# Patient Record
Sex: Female | Born: 1990 | Race: White | Hispanic: No | Marital: Single | Smoking: Former smoker
Health system: Southern US, Community
[De-identification: ages and names within clinical notes are randomized; demographics above are authoritative.]

## PROBLEM LIST (undated history)

## (undated) ENCOUNTER — Inpatient Hospital Stay (HOSPITAL_COMMUNITY): Payer: Self-pay

## (undated) DIAGNOSIS — D649 Anemia, unspecified: Secondary | ICD-10-CM

## (undated) DIAGNOSIS — J984 Other disorders of lung: Secondary | ICD-10-CM

## (undated) DIAGNOSIS — S82891A Other fracture of right lower leg, initial encounter for closed fracture: Secondary | ICD-10-CM

## (undated) DIAGNOSIS — O2340 Unspecified infection of urinary tract in pregnancy, unspecified trimester: Secondary | ICD-10-CM

## (undated) HISTORY — DX: Other fracture of right lower leg, initial encounter for closed fracture: S82.891A

## (undated) HISTORY — DX: Other disorders of lung: J98.4

## (undated) HISTORY — DX: Anemia, unspecified: D64.9

---
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
## 2010-01-04 ENCOUNTER — Encounter: Payer: Self-pay | Admitting: Obstetrics and Gynecology

## 2010-01-04 ENCOUNTER — Ambulatory Visit: Payer: Self-pay | Admitting: Obstetrics & Gynecology

## 2010-01-04 LAB — CONVERTED CEMR LAB
HCT: 32.2 % — ABNORMAL LOW (ref 36.0–46.0)
Platelets: 210 10*3/uL (ref 150–400)
RDW: 13 % (ref 11.5–15.5)

## 2010-01-14 ENCOUNTER — Encounter: Payer: Self-pay | Admitting: Family Medicine

## 2010-01-14 LAB — CONVERTED CEMR LAB
Trich, Wet Prep: NONE SEEN
Yeast Wet Prep HPF POC: NONE SEEN

## 2010-01-25 ENCOUNTER — Ambulatory Visit: Payer: Self-pay | Admitting: Obstetrics & Gynecology

## 2010-02-02 ENCOUNTER — Inpatient Hospital Stay (HOSPITAL_COMMUNITY): Admission: AD | Admit: 2010-02-02 | Discharge: 2010-02-03 | Payer: Self-pay | Admitting: Obstetrics & Gynecology

## 2010-02-02 ENCOUNTER — Ambulatory Visit: Payer: Self-pay | Admitting: Family

## 2010-02-08 ENCOUNTER — Ambulatory Visit: Payer: Self-pay | Admitting: Obstetrics & Gynecology

## 2010-02-22 ENCOUNTER — Ambulatory Visit: Payer: Self-pay | Admitting: Family Medicine

## 2010-03-08 ENCOUNTER — Ambulatory Visit: Payer: Self-pay | Admitting: Obstetrics & Gynecology

## 2010-03-08 LAB — CONVERTED CEMR LAB: Chlamydia, Swab/Urine, PCR: NEGATIVE

## 2010-03-11 ENCOUNTER — Inpatient Hospital Stay (HOSPITAL_COMMUNITY): Admission: AD | Admit: 2010-03-11 | Discharge: 2010-03-11 | Payer: Self-pay | Admitting: Obstetrics & Gynecology

## 2010-03-11 ENCOUNTER — Ambulatory Visit: Payer: Self-pay | Admitting: Family Medicine

## 2010-03-15 ENCOUNTER — Ambulatory Visit: Payer: Self-pay | Admitting: Advanced Practice Midwife

## 2010-03-15 ENCOUNTER — Inpatient Hospital Stay (HOSPITAL_COMMUNITY): Admission: AD | Admit: 2010-03-15 | Discharge: 2010-03-15 | Payer: Self-pay | Admitting: Obstetrics & Gynecology

## 2010-03-15 ENCOUNTER — Ambulatory Visit: Payer: Self-pay | Admitting: Obstetrics & Gynecology

## 2010-03-17 ENCOUNTER — Encounter: Payer: Self-pay | Admitting: Obstetrics & Gynecology

## 2010-03-17 LAB — CONVERTED CEMR LAB
Collection Interval-CRCL: 24 hr
Creatinine Clearance: 119 mL/min — ABNORMAL HIGH (ref 75–115)
Creatinine, Urine: 48.9 mg/dL

## 2010-03-22 ENCOUNTER — Ambulatory Visit: Payer: Self-pay | Admitting: Obstetrics & Gynecology

## 2010-03-28 ENCOUNTER — Ambulatory Visit: Payer: Self-pay | Admitting: Obstetrics & Gynecology

## 2010-04-01 ENCOUNTER — Inpatient Hospital Stay (HOSPITAL_COMMUNITY): Admission: AD | Admit: 2010-04-01 | Discharge: 2010-04-03 | Payer: Self-pay | Admitting: Obstetrics & Gynecology

## 2010-04-01 ENCOUNTER — Ambulatory Visit: Payer: Self-pay | Admitting: Obstetrics & Gynecology

## 2010-05-10 ENCOUNTER — Ambulatory Visit: Payer: Self-pay | Admitting: Obstetrics & Gynecology

## 2010-05-17 ENCOUNTER — Ambulatory Visit: Payer: Self-pay | Admitting: Family Medicine

## 2010-06-28 ENCOUNTER — Ambulatory Visit: Payer: Self-pay | Admitting: Nurse Practitioner

## 2010-10-30 ENCOUNTER — Encounter: Payer: Self-pay | Admitting: Obstetrics & Gynecology

## 2010-12-25 LAB — CBC
MCH: 31.3 pg (ref 26.0–34.0)
MCHC: 34.3 g/dL (ref 30.0–36.0)
MCV: 91.3 fL (ref 78.0–100.0)
Platelets: 186 10*3/uL (ref 150–400)
RBC: 3.8 MIL/uL — ABNORMAL LOW (ref 3.87–5.11)
RDW: 14.4 % (ref 11.5–15.5)

## 2010-12-25 LAB — RPR: RPR Ser Ql: NONREACTIVE

## 2010-12-26 LAB — COMPREHENSIVE METABOLIC PANEL
ALT: 14 U/L (ref 0–35)
AST: 19 U/L (ref 0–37)
CO2: 24 mEq/L (ref 19–32)
Calcium: 9.4 mg/dL (ref 8.4–10.5)
GFR calc non Af Amer: >60 mL/min (ref >60)
Sodium: 135 mEq/L (ref 135–145)
Total Protein: 6.2 g/dL (ref 6.0–8.3)

## 2010-12-26 LAB — URINALYSIS, ROUTINE W REFLEX MICROSCOPIC
Hgb urine dipstick: NEGATIVE
Nitrite: NEGATIVE
Protein, ur: NEGATIVE mg/dL
Specific Gravity, Urine: 1.01 (ref 1.005–1.030)
Urobilinogen, UA: 0.2 mg/dL (ref 0.0–1.0)

## 2010-12-26 LAB — CBC
Platelets: 186 10*3/uL (ref 150–400)
RDW: 13.5 % (ref 11.5–15.5)
WBC: 9.5 10*3/uL (ref 4.0–10.5)

## 2010-12-26 LAB — LACTATE DEHYDROGENASE: LDH: 126 U/L (ref 94–250)

## 2010-12-26 LAB — URINE MICROSCOPIC-ADD ON

## 2010-12-27 LAB — URINALYSIS, ROUTINE W REFLEX MICROSCOPIC
Glucose, UA: 100 mg/dL — AB
Ketones, ur: NEGATIVE mg/dL
Protein, ur: NEGATIVE mg/dL
Urobilinogen, UA: 0.2 mg/dL (ref 0.0–1.0)

## 2010-12-27 LAB — URINE MICROSCOPIC-ADD ON

## 2010-12-27 LAB — FETAL FIBRONECTIN: Fetal Fibronectin: POSITIVE — AB

## 2010-12-27 LAB — STREP B DNA PROBE

## 2010-12-27 LAB — WET PREP, GENITAL

## 2010-12-27 LAB — GC/CHLAMYDIA PROBE AMP, GENITAL: GC Probe Amp, Genital: NEGATIVE

## 2011-02-21 NOTE — Assessment & Plan Note (Signed)
NAME:  Sharon Chandler, Sharon Chandler          ACCOUNT NO.:  [id number]   MEDICAL RECORD NO.:  [id number]          PATIENT TYPE:  POB   LOCATION:  [facility] at [facility]         FACILITY:  [facility]   PHYSICIAN:  Scheryl Darter, MD       DATE OF BIRTH:  August 31, 1991   DATE OF SERVICE:  05/10/2010                                  CLINIC NOTE   The patient comes today postpartum from vaginal delivery on April 01, 2010.  She delivered a female at 40 weeks 2 days 6 pounds 15 ounces.  She  had a first-degree laceration which was repaired.  She has not  menstruated.  She has had no sexual activity since delivery.  She would  like to schedule insertion of Mirena.  She completed the postnatal  depression scale and her score was 0.  Her affect is normal.  Blood  pressure is 125/63, weight 123 pounds, height 5 feet 5 inches.  Abdomen  soft, nontender, no mass.  External genitalia,  vagina and cervix  appeared normal with well-healed perineum and no suture material  visible.  Uterus normal size, nontender, no mass.   IMPRESSION:  The patient is doing well postpartum.  She will return to  have a Mirena placed.  We gave her information on the device.      Scheryl Darter, MD     JA/MEDQ  D:  05/10/2010  T:  05/11/2010  Job:  [id number]

## 2011-02-21 NOTE — Group Therapy Note (Signed)
Sharon Chandler, LINDY          ACCOUNT NO.:  [id number]   MEDICAL RECORD NO.:  [id number]          PATIENT TYPE:  POB   LOCATION:  [facility]                   FACILITY:  [facility]   PHYSICIAN:  Argentina Donovan, MD        DATE OF BIRTH:  05/24/1991   DATE OF SERVICE:  05/26/2008                                  CLINIC NOTE   The patient is a 20 year old nulligravida white female who was seen in  March of this year because her mother had a history of blood clots.  She  had oral contraceptive in the past without problem.  She had regular  periods at that point.  Other complaint at that time was itchy bruised,  easily fatigued, and has had __________ headaches.  She came in July  2009, did not have a period since March 23, 2008, and complaint of breast  pain, back pain, nausea, headaches, increased urination, and fatigue.  At that time, beta hCG was done which was negative.  She returns today  on May 26, 2008, with the same complaint.  She has not had any  bleeding since June 2009, which is very unusual for her, and she has  once again had breast pain, nausea, fatigue, and bloating.  She has no  sign of hirsutism.  Her periods were regular up until March 2009.  She  has had undergone no acute trauma, emotional or otherwise, and I think  that she deserves now a workup.  We are going to get an ultrasound to  evaluate the ovaries for polycystic ovarian syndrome, and I think this  is more likely to be hypovolemic amenorrhea.  I will check her FSH, TSH,  prolactin, free testosterone and get another beta hCG and have her  return next week.  If the pregnancy test is negative and all other  things are normal, we will cycle her on birth control pills for few  months, I think that may relieve her symptoms.   IMPRESSION:  Secondary amenorrhea of unknown etiology.           ______________________________  Argentina Donovan, MD     PR/MEDQ  D:  05/26/2008  T:  05/27/2008  Job:  [id number]

## 2011-02-21 NOTE — Assessment & Plan Note (Signed)
NAME:  Sharon Chandler, Sharon Chandler          ACCOUNT NO.:  [id number]   MEDICAL RECORD NO.:  [id number]          PATIENT TYPE:  POB   LOCATION:  [facility]         FACILITY:  [facility]   PHYSICIAN:  Tinnie Gens, MD        DATE OF BIRTH:  09-17-1991   DATE OF SERVICE:  05/17/2010                                  CLINIC NOTE   The patient comes to office today for a Mirena IUD insertion.  The  patient is postpartum.  She delivered on April 01, 2010, female at 40 weeks  and 2 days.  She has not resumed sexual activity since then.  She has  not had her period yet.   PROCEDURE:  The patient has signed the consent for Mirena IUD.  The  pelvic exam was performed with no abnormalities noted.  The speculum was  inserted.  The cervix was prepped with the Betadine.  The tenaculum was  applied.  Cervix sounded to 7 cm.  The Mirena IUD was inserted without  any difficulties.  The strings were cut.  The tenaculum removed and  hemostasis was achieved.   ASSESSMENT:  Contraception, Mirena IUD.   PLAN:  The patient had Mirena IUD inserted today without any  difficulties.  She was given 800 mg of Motrin prior to the procedure and  asked to take that again before she goes to bed tonight.  She is asked  to report any excessive bleeding or pain.  She is instructed on how to  check the IUD strings.  She is asked to use backup contraception for 2  weeks from today.  She will return to the clinic in 6 weeks for IUD  string check.      Remonia Richter, NP    ______________________________  Tinnie Gens, MD    LR/MEDQ  D:  05/17/2010  T:  05/18/2010  Job:  [id number]

## 2011-02-21 NOTE — Assessment & Plan Note (Signed)
NAMEMARKEIA, Sharon Chandler          ACCOUNT NO.:  [id number]   MEDICAL RECORD NO.:  [id number]          PATIENT TYPE:  POB   LOCATION:  [facility]         FACILITY:  [facility]   PHYSICIAN:  Tinnie Gens, MD        DATE OF BIRTH:  1991/09/11   DATE OF SERVICE:  12/17/2007                                  CLINIC NOTE   CHIEF COMPLAINT:  Irregular cycles.   HISTORY OF PRESENT ILLNESS:  The patient is a 20 year old nullipara who  has previously been on oral contraceptives, placed on those by her  pediatrician who did a full workup because her mother had a history of  blood clots.  The patient reports that they have all been negative and  she took pills without difficulty in the past.  The patient has since  went to the health department where she got a Pap smear and cultures  done.  However, she was placed on Depo-Provera and got one shot of this  October 2008.  She went three months without a cycle and then has been  bleeding since December of this of 2008.  She is very unhappy about  this.  She also been started on Doryx 150 mg and Differin cream or  Differin gel for acne.  The patient has been sexually active for some  time, but is not currently sexually active.   PAST MEDICAL HISTORY:  Is negative.   PAST SURGICAL HISTORY:  Also negative.   MEDICATIONS:  1. Doryx 150 mg one p.o. daily.  2. Differin 0.3% gel to apply twice daily as needed.   ALLERGIES:  None known.   OBSTETRICAL HISTORY:  She is G0.   GYN HISTORY:  Menarche at age 44; normally has regular cycles, but does  have a history of anemia from bleeding.   FAMILY HISTORY:  Diabetes, coronary artery disease, hypertension and  blood clots in her mother who was not on pills at the time.  She does  use occasional alcohol and one caffeinated beverage per day. 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   REVIEW OF SYSTEMS:  A 14 point review of systems is reviewed. It  is  positive for easy  bruising, fatigue and headaches.  Otherwise, is  negative.   PHYSICAL EXAMINATION:  On exam today her vitals are as noted in the  chart.  She is a well-developed, well-nourished female in no acute  distress.  ABDOMEN:  Soft, nontender, nondistended.   IMPRESSION:  1. Irregular cycles of bleeding related to Depo Provera.  2. Need for birth control.  3. Acne.  4. History of anemia.   PLAN:  1. Start her on Fem Con FE after a negative  EPT today.  She started      her cycle today as well so she can start the pill packs on Sunday      with no unprotected intercourse until then.  2. CBC for history of anemia and continuous bleeding for the last 3      months.  3. Follow up two months to see how this is going for her. The patient      is given  a prescription for Fem Con FE  4. When the patient returns should try to get records from      [facility] if possible regarding this workup.           ______________________________  Tinnie Gens, MD     TP/MEDQ  D:  12/17/2007  T:  12/18/2007  Job:  [id number]

## 2011-05-25 ENCOUNTER — Ambulatory Visit (INDEPENDENT_AMBULATORY_CARE_PROVIDER_SITE_OTHER): Payer: BC Managed Care – PPO | Admitting: Family Medicine

## 2011-05-25 DIAGNOSIS — N390 Urinary tract infection, site not specified: Secondary | ICD-10-CM

## 2011-05-25 MED ORDER — CEPHALEXIN 500 MG PO CAPS: 500.0000 mg | ORAL_CAPSULE | Freq: Three times a day (TID) | ORAL | Status: AC

## 2011-05-25 NOTE — Patient Instructions (Signed)
Patient was seen today for UTI symptoms. Per patient  X 1 week experiencing lower pelvic pain, painful urination. Urine dipstick was done today which shows large(+) blood and Large (+) Lueks. Urine was sent for culture. Keflex 500mg  was send to Pharmacy. Pt will call office if not better.

## 2011-05-28 LAB — URINE CULTURE: Colony Count: >=100000

## 2011-06-22 ENCOUNTER — Telehealth: Payer: Self-pay | Admitting: *Deleted

## 2011-06-22 DIAGNOSIS — N390 Urinary tract infection, site not specified: Secondary | ICD-10-CM

## 2011-06-22 MED ORDER — CIPROFLOXACIN HCL 500 MG PO TABS: 500.0000 mg | ORAL_TABLET | Freq: Two times a day (BID) | ORAL | Status: DC

## 2011-06-22 MED ORDER — CIPROFLOXACIN HCL 500 MG PO TABS: 500.0000 mg | ORAL_TABLET | Freq: Two times a day (BID) | ORAL | Status: AC

## 2011-06-22 NOTE — Telephone Encounter (Signed)
Patients UTI symptoms have returned.  She is having increased urgency with urination and pain with urination.  She did take the Keflex without any problems, she wishes to have something else called in.  She is not able to come into the office today.  We will call in Cipro for her.

## 2011-06-22 NOTE — Telephone Encounter (Signed)
Meds were called into the wrong [facility], she wishes to have it at [facility].

## 2011-06-29 ENCOUNTER — Ambulatory Visit: Payer: BC Managed Care – PPO | Admitting: Obstetrics & Gynecology

## 2011-06-29 DIAGNOSIS — Z01419 Encounter for gynecological examination (general) (routine) without abnormal findings: Secondary | ICD-10-CM

## 2011-11-24 ENCOUNTER — Telehealth: Payer: Self-pay | Admitting: *Deleted

## 2011-11-24 DIAGNOSIS — N390 Urinary tract infection, site not specified: Secondary | ICD-10-CM

## 2011-11-24 MED ORDER — CIPROFLOXACIN HCL 500 MG PO TABS: 500.0000 mg | ORAL_TABLET | Freq: Two times a day (BID) | ORAL | Status: AC

## 2011-11-24 NOTE — Telephone Encounter (Signed)
Patient has burning and pain with urination.  History of UTI  .  Unable to be seen until mid next week.  Will call in meds and she will call us back if her problem continues.

## 2012-03-25 ENCOUNTER — Other Ambulatory Visit (INDEPENDENT_AMBULATORY_CARE_PROVIDER_SITE_OTHER): Payer: BC Managed Care – PPO | Admitting: *Deleted

## 2012-03-25 DIAGNOSIS — R319 Hematuria, unspecified: Secondary | ICD-10-CM

## 2012-03-25 LAB — POCT URINALYSIS DIPSTICK
Bilirubin, UA: NEGATIVE
Glucose, UA: NEGATIVE
Ketones, UA: NEGATIVE
Leukocytes, UA: NEGATIVE
Protein, UA: NEGATIVE

## 2012-03-25 NOTE — Progress Notes (Signed)
Patient is having increased right side pain that is radiating to her back.  This is going on for two weeks now and the pain is constant but it gets worse and eases off.  Her urine is clean except for a small amount of blood seen.  I will send for culture to be sure and have advised her to make an appointment to see the physician as she might be having a kidney stone, rather than an infection.

## 2012-04-04 ENCOUNTER — Ambulatory Visit (INDEPENDENT_AMBULATORY_CARE_PROVIDER_SITE_OTHER): Payer: BC Managed Care – PPO | Admitting: Obstetrics & Gynecology

## 2012-04-04 ENCOUNTER — Encounter: Payer: Self-pay | Admitting: Obstetrics & Gynecology

## 2012-04-04 VITALS — BP 104/68 | HR 65 | Ht 64.0 in | Wt 117.0 lb

## 2012-04-04 DIAGNOSIS — B9689 Other specified bacterial agents as the cause of diseases classified elsewhere: Secondary | ICD-10-CM

## 2012-04-04 DIAGNOSIS — N760 Acute vaginitis: Secondary | ICD-10-CM

## 2012-04-04 DIAGNOSIS — R102 Pelvic and perineal pain: Secondary | ICD-10-CM

## 2012-04-04 DIAGNOSIS — N949 Unspecified condition associated with female genital organs and menstrual cycle: Secondary | ICD-10-CM

## 2012-04-04 DIAGNOSIS — A499 Bacterial infection, unspecified: Secondary | ICD-10-CM

## 2012-04-04 NOTE — Patient Instructions (Signed)
Return to clinic for any scheduled appointments or for any gynecologic concerns as needed.   

## 2012-04-04 NOTE — Progress Notes (Signed)
History:  20 y.o. G1P1001 here today for RLQ and R flank pain and urinary symptoms for over two weeks.  Denies any vaginal discharge, but reports unprotected intercourse recently.  Pain is constant, radiates from RLQ to R flank and back.  8/10 on pain scale.  No nausea, vomiting, fevers or other systemic symptoms.  The following portions of the patient's history were reviewed and updated as appropriate: allergies, current medications, past family history, past medical history, past social history, past surgical history and problem list.  Review of Systems:  Pertinent items are noted in HPI.  Objective:  Physical Exam Blood pressure 104/68, pulse 65, height 5\' 4"  (1.626 m), weight 117 lb (53.071 kg). Gen: NAD Abd/Back: Soft, nontender , moderate RLQ tenderness and flank tenderness, R CVAT Pelvic: Normal appearing external genitalia; normal appearing vaginal mucosa and cervix, IUD strings seen.  Thin, yellow vaginal discharge seen.  Small uterus, no other palpable masses, no uterine tenderness.  R adnexal fullness > left, tenderness on right adnexal region. Wet prep and GC/Chlam samples obtained   Assessment & Plan:  R pelvic pain/flank pain Pelvic ultrasound to evaluate for ovarian cysts, IUD position or other anomalies Renal ultrasound to evaluate for stones Wet prep, GC/Chlam to evaluate for infectious etiologies.  Safe sex practices emphasized. Follow up results and manage accordingly

## 2012-04-05 LAB — WET PREP, GENITAL
Trich, Wet Prep: NONE SEEN
Yeast Wet Prep HPF POC: NONE SEEN

## 2012-04-05 LAB — GC/CHLAMYDIA PROBE AMP, GENITAL
Chlamydia, DNA Probe: NEGATIVE
GC Probe Amp, Genital: NEGATIVE

## 2012-04-08 ENCOUNTER — Ambulatory Visit: Payer: Self-pay | Admitting: Obstetrics & Gynecology

## 2012-04-08 MED ORDER — TINIDAZOLE 500 MG PO TABS: 2.0000 g | ORAL_TABLET | Freq: Every day | ORAL | Status: AC

## 2012-04-08 NOTE — Progress Notes (Signed)
Wet prep showed clue cells, Tinidazole e-prescribed. Patient will be called to inform her of diagnosis and treatment, and to go pick up the prescription.  She will also be told to call the clinic with any further questions or further concerns.

## 2012-04-08 NOTE — Addendum Note (Signed)
Addended by: Jaynie Collins A on: 04/08/2012 10:01 AM   Modules accepted: Orders

## 2012-04-15 ENCOUNTER — Encounter: Payer: Self-pay | Admitting: Obstetrics & Gynecology

## 2012-07-24 ENCOUNTER — Encounter: Payer: Self-pay | Admitting: Obstetrics and Gynecology

## 2012-07-24 ENCOUNTER — Ambulatory Visit (INDEPENDENT_AMBULATORY_CARE_PROVIDER_SITE_OTHER): Payer: BC Managed Care – PPO | Admitting: Obstetrics and Gynecology

## 2012-07-24 VITALS — BP 100/68 | HR 63 | Ht 64.0 in | Wt 123.0 lb

## 2012-07-24 DIAGNOSIS — Z124 Encounter for screening for malignant neoplasm of cervix: Secondary | ICD-10-CM

## 2012-07-24 DIAGNOSIS — Z01419 Encounter for gynecological examination (general) (routine) without abnormal findings: Secondary | ICD-10-CM

## 2012-07-24 DIAGNOSIS — N390 Urinary tract infection, site not specified: Secondary | ICD-10-CM

## 2012-07-24 DIAGNOSIS — Z113 Encounter for screening for infections with a predominantly sexual mode of transmission: Secondary | ICD-10-CM

## 2012-07-24 LAB — POCT URINALYSIS DIPSTICK
Nitrite, UA: NEGATIVE
Urobilinogen, UA: 0.2
pH, UA: 5

## 2012-07-24 MED ORDER — NITROFURANTOIN MONOHYD MACRO 100 MG PO CAPS: 100.0000 mg | ORAL_CAPSULE | Freq: Two times a day (BID) | ORAL | Status: DC

## 2012-07-24 NOTE — Addendum Note (Signed)
Addended by: Gaylen Venning C on: 07/24/2012 11:00 AM   Modules accepted: Orders  

## 2012-07-24 NOTE — Patient Instructions (Signed)
Preventive Care for Adults, Female A healthy lifestyle and preventive care can promote health and wellness. Preventive health guidelines for women include the following key practices.  A routine yearly physical is a good way to check with your caregiver about your health and preventive screening. It is a chance to share any concerns and updates on your health, and to receive a thorough exam.  Visit your dentist for a routine exam and preventive care every 6 months. Brush your teeth twice a day and floss once a day. Good oral hygiene prevents tooth decay and gum disease.  The frequency of eye exams is based on your age, health, family medical history, use of contact lenses, and other factors. Follow your caregiver's recommendations for frequency of eye exams.  Eat a healthy diet. Foods like vegetables, fruits, whole grains, low-fat dairy products, and lean protein foods contain the nutrients you need without too many calories. Decrease your intake of foods high in solid fats, added sugars, and salt. Eat the right amount of calories for you.Get information about a proper diet from your caregiver, if necessary.  Regular physical exercise is one of the most important things you can do for your health. Most adults should get at least 150 minutes of moderate-intensity exercise (any activity that increases your heart rate and causes you to sweat) each week. In addition, most adults need muscle-strengthening exercises on 2 or more days a week.  Maintain a healthy weight. The body mass index (BMI) is a screening tool to identify possible weight problems. It provides an estimate of body fat based on height and weight. Your caregiver can help determine your BMI, and can help you achieve or maintain a healthy weight.For adults 20 years and older:  A BMI below 18.5 is considered underweight.  A BMI of 18.5 to 24.9 is normal.  A BMI of 25 to 29.9 is considered overweight.  A BMI of 30 and above is  considered obese.  Maintain normal blood lipids and cholesterol levels by exercising and minimizing your intake of saturated fat. Eat a balanced diet with plenty of fruit and vegetables. Blood tests for lipids and cholesterol should begin at age 20 and be repeated every 5 years. If your lipid or cholesterol levels are high, you are over 50, or you are at high risk for heart disease, you may need your cholesterol levels checked more frequently.Ongoing high lipid and cholesterol levels should be treated with medicines if diet and exercise are not effective.  If you smoke, find out from your caregiver how to quit. If you do not use tobacco, do not start.  If you are pregnant, do not drink alcohol. If you are breastfeeding, be very cautious about drinking alcohol. If you are not pregnant and choose to drink alcohol, do not exceed 1 drink per day. One drink is considered to be 12 ounces (355 mL) of beer, 5 ounces (148 mL) of wine, or 1.5 ounces (44 mL) of liquor.  Avoid use of street drugs. Do not share needles with anyone. Ask for help if you need support or instructions about stopping the use of drugs.  High blood pressure causes heart disease and increases the risk of stroke. Your blood pressure should be checked at least every 1 to 2 years. Ongoing high blood pressure should be treated with medicines if weight loss and exercise are not effective.  If you are 55 to 21 years old, ask your caregiver if you should take aspirin to prevent strokes.  Diabetes   screening involves taking a blood sample to check your fasting blood sugar level. This should be done once every 3 years, after age 45, if you are within normal weight and without risk factors for diabetes. Testing should be considered at a younger age or be carried out more frequently if you are overweight and have at least 1 risk factor for diabetes.  Breast cancer screening is essential preventive care for women. You should practice "breast  self-awareness." This means understanding the normal appearance and feel of your breasts and may include breast self-examination. Any changes detected, no matter how small, should be reported to a caregiver. Women in their 20s and 30s should have a clinical breast exam (CBE) by a caregiver as part of a regular health exam every 1 to 3 years. After age 40, women should have a CBE every year. Starting at age 40, women should consider having a mammography (breast X-ray test) every year. Women who have a family history of breast cancer should talk to their caregiver about genetic screening. Women at a high risk of breast cancer should talk to their caregivers about having magnetic resonance imaging (MRI) and a mammography every year.  The Pap test is a screening test for cervical cancer. A Pap test can show cell changes on the cervix that might become cervical cancer if left untreated. A Pap test is a procedure in which cells are obtained and examined from the lower end of the uterus (cervix).  Women should have a Pap test starting at age 21.  Between ages 21 and 29, Pap tests should be repeated every 2 years.  Beginning at age 30, you should have a Pap test every 3 years as long as the past 3 Pap tests have been normal.  Some women have medical problems that increase the chance of getting cervical cancer. Talk to your caregiver about these problems. It is especially important to talk to your caregiver if a new problem develops soon after your last Pap test. In these cases, your caregiver may recommend more frequent screening and Pap tests.  The above recommendations are the same for women who have or have not gotten the vaccine for human papillomavirus (HPV).  If you had a hysterectomy for a problem that was not cancer or a condition that could lead to cancer, then you no longer need Pap tests. Even if you no longer need a Pap test, a regular exam is a good idea to make sure no other problems are  starting.  If you are between ages 65 and 70, and you have had normal Pap tests going back 10 years, you no longer need Pap tests. Even if you no longer need a Pap test, a regular exam is a good idea to make sure no other problems are starting.  If you have had past treatment for cervical cancer or a condition that could lead to cancer, you need Pap tests and screening for cancer for at least 20 years after your treatment.  If Pap tests have been discontinued, risk factors (such as a new sexual partner) need to be reassessed to determine if screening should be resumed.  The HPV test is an additional test that may be used for cervical cancer screening. The HPV test looks for the virus that can cause the cell changes on the cervix. The cells collected during the Pap test can be tested for HPV. The HPV test could be used to screen women aged 30 years and older, and should   be used in women of any age who have unclear Pap test results. After the age of 30, women should have HPV testing at the same frequency as a Pap test.  Colorectal cancer can be detected and often prevented. Most routine colorectal cancer screening begins at the age of 50 and continues through age 75. However, your caregiver may recommend screening at an earlier age if you have risk factors for colon cancer. On a yearly basis, your caregiver may provide home test kits to check for hidden blood in the stool. Use of a small camera at the end of a tube, to directly examine the colon (sigmoidoscopy or colonoscopy), can detect the earliest forms of colorectal cancer. Talk to your caregiver about this at age 50, when routine screening begins. Direct examination of the colon should be repeated every 5 to 10 years through age 75, unless early forms of pre-cancerous polyps or small growths are found.  Hepatitis C blood testing is recommended for all people born from 1945 through 1965 and any individual with known risks for hepatitis C.  Practice  safe sex. Use condoms and avoid high-risk sexual practices to reduce the spread of sexually transmitted infections (STIs). STIs include gonorrhea, chlamydia, syphilis, trichomonas, herpes, HPV, and human immunodeficiency virus (HIV). Herpes, HIV, and HPV are viral illnesses that have no cure. They can result in disability, cancer, and death. Sexually active women aged 25 and younger should be checked for chlamydia. Older women with new or multiple partners should also be tested for chlamydia. Testing for other STIs is recommended if you are sexually active and at increased risk.  Osteoporosis is a disease in which the bones lose minerals and strength with aging. This can result in serious bone fractures. The risk of osteoporosis can be identified using a bone density scan. Women ages 65 and over and women at risk for fractures or osteoporosis should discuss screening with their caregivers. Ask your caregiver whether you should take a calcium supplement or vitamin D to reduce the rate of osteoporosis.  Menopause can be associated with physical symptoms and risks. Hormone replacement therapy is available to decrease symptoms and risks. You should talk to your caregiver about whether hormone replacement therapy is right for you.  Use sunscreen with sun protection factor (SPF) of 30 or more. Apply sunscreen liberally and repeatedly throughout the day. You should seek shade when your shadow is shorter than you. Protect yourself by wearing long sleeves, pants, a wide-brimmed hat, and sunglasses year round, whenever you are outdoors.  Once a month, do a whole body skin exam, using a mirror to look at the skin on your back. Notify your caregiver of new moles, moles that have irregular borders, moles that are larger than a pencil eraser, or moles that have changed in shape or color.  Stay current with required immunizations.  Influenza. You need a dose every fall (or winter). The composition of the flu vaccine  changes each year, so being vaccinated once is not enough.  Pneumococcal polysaccharide. You need 1 to 2 doses if you smoke cigarettes or if you have certain chronic medical conditions. You need 1 dose at age 65 (or older) if you have never been vaccinated.  Tetanus, diphtheria, pertussis (Tdap, Td). Get 1 dose of Tdap vaccine if you are younger than age 65, are over 65 and have contact with an infant, are a healthcare worker, are pregnant, or simply want to be protected from whooping cough. After that, you need a Td   booster dose every 10 years. Consult your caregiver if you have not had at least 3 tetanus and diphtheria-containing shots sometime in your life or have a deep or dirty wound.  HPV. You need this vaccine if you are a woman age 26 or younger. The vaccine is given in 3 doses over 6 months.  Measles, mumps, rubella (MMR). You need at least 1 dose of MMR if you were born in 1957 or later. You may also need a second dose.  Meningococcal. If you are age 19 to 21 and a first-year college student living in a residence hall, or have one of several medical conditions, you need to get vaccinated against meningococcal disease. You may also need additional booster doses.  Zoster (shingles). If you are age 60 or older, you should get this vaccine.  Varicella (chickenpox). If you have never had chickenpox or you were vaccinated but received only 1 dose, talk to your caregiver to find out if you need this vaccine.  Hepatitis A. You need this vaccine if you have a specific risk factor for hepatitis A virus infection or you simply wish to be protected from this disease. The vaccine is usually given as 2 doses, 6 to 18 months apart.  Hepatitis B. You need this vaccine if you have a specific risk factor for hepatitis B virus infection or you simply wish to be protected from this disease. The vaccine is given in 3 doses, usually over 6 months. Preventive Services / Frequency Ages 19 to 39  Blood  pressure check.** / Every 1 to 2 years.  Lipid and cholesterol check.** / Every 5 years beginning at age 20.  Clinical breast exam.** / Every 3 years for women in their 20s and 30s.  Pap test.** / Every 2 years from ages 21 through 29. Every 3 years starting at age 30 through age 65 or 70 with a history of 3 consecutive normal Pap tests.  HPV screening.** / Every 3 years from ages 30 through ages 65 to 70 with a history of 3 consecutive normal Pap tests.  Hepatitis C blood test.** / For any individual with known risks for hepatitis C.  Skin self-exam. / Monthly.  Influenza immunization.** / Every year.  Pneumococcal polysaccharide immunization.** / 1 to 2 doses if you smoke cigarettes or if you have certain chronic medical conditions.  Tetanus, diphtheria, pertussis (Tdap, Td) immunization. / A one-time dose of Tdap vaccine. After that, you need a Td booster dose every 10 years.  HPV immunization. / 3 doses over 6 months, if you are 26 and younger.  Measles, mumps, rubella (MMR) immunization. / You need at least 1 dose of MMR if you were born in 1957 or later. You may also need a second dose.  Meningococcal immunization. / 1 dose if you are age 19 to 21 and a first-year college student living in a residence hall, or have one of several medical conditions, you need to get vaccinated against meningococcal disease. You may also need additional booster doses.  Varicella immunization.** / Consult your caregiver.  Hepatitis A immunization.** / Consult your caregiver. 2 doses, 6 to 18 months apart.  Hepatitis B immunization.** / Consult your caregiver. 3 doses usually over 6 months. ** Family history and personal history of risk and conditions may change your caregiver's recommendations. Document Released: 11/21/2001 Document Revised: 12/18/2011 Document Reviewed: 02/20/2011 [employer] Patient Information 2013 [employer].  

## 2012-07-24 NOTE — Addendum Note (Signed)
Addended by: Vinnie Langton C on: 07/24/2012 11:00 AM   Modules accepted: Orders

## 2012-07-24 NOTE — Progress Notes (Signed)
  Subjective:     Sharon Chandler is a 21 y.o. female G1P1 with BMI 21 who is here for a comprehensive physical exam. The patient reports a vaginal discharge x 2 weeks. She describes it as white and non-pruritic, not associated with an odor. She was treated 1 month ago for a UTI and is uncertain if it has returned or if this is a separate issue. She is using Mirena IUD for birth control and is satisfied with her choice.  History   Social History  . Marital Status: Single    Spouse Name: N/A    Number of Children: N/A  . Years of Education: N/A   Occupational History  . Not on file.   Social History Main Topics  . Smoking status: Never Smoker   . Smokeless tobacco: Not on file  . Alcohol Use: Yes     ocacasion  . Drug Use: No  . Sexually Active: Yes -- Female partner(s)    Birth Control/ Protection: IUD   Other Topics Concern  . Not on file   Social History Narrative  . No narrative on file   Health Maintenance  Topic Date Due  . Chlamydia Screening  11/12/2005  . Pap Smear  11/12/2008  . Tetanus/tdap  11/12/2009  . Influenza Vaccine  06/09/2012       Review of Systems A comprehensive review of systems was negative.   Objective:     GENERAL: Well-developed, well-nourished female in no acute distress.  HEENT: Normocephalic, atraumatic. Sclerae anicteric.  NECK: Supple. Normal thyroid.  LUNGS: Clear to auscultation bilaterally.  HEART: Regular rate and rhythm. BREASTS: Symmetric in size. No palpable masses or lymphadenopathy, skin changes, or nipple drainage. ABDOMEN: Soft, nontender, nondistended. No organomegaly. PELVIC: Normal external female genitalia. Vagina is pink and rugated.  Normal discharge. Normal appearing cervix. IUD strings visualized at os. Uterus is normal in size. No adnexal mass or tenderness. EXTREMITIES: No cyanosis, clubbing, or edema, 2+ distal pulses.    Assessment:    Healthy female exam.      Plan:    Pap smear and cultures collected Wet  prep collected UA showed large blood and moderate leuk- will send urine culture and treat with macrobid See After Visit Summary for Counseling Recommendations

## 2012-07-26 LAB — URINE CULTURE: Colony Count: NO GROWTH

## 2012-07-29 ENCOUNTER — Ambulatory Visit: Payer: BC Managed Care – PPO | Admitting: Internal Medicine

## 2012-07-31 ENCOUNTER — Telehealth: Payer: Self-pay | Admitting: *Deleted

## 2012-07-31 DIAGNOSIS — B9689 Other specified bacterial agents as the cause of diseases classified elsewhere: Secondary | ICD-10-CM

## 2012-07-31 MED ORDER — METRONIDAZOLE 500 MG PO TABS: 500.0000 mg | ORAL_TABLET | Freq: Two times a day (BID) | ORAL | Status: DC

## 2012-07-31 NOTE — Telephone Encounter (Signed)
Patient is still having increased odor.  Her wet prep shows clue cells.  I will call in flagyl for her and she will call us back to follow up if symptoms persist.

## 2012-12-19 ENCOUNTER — Ambulatory Visit: Payer: Self-pay | Admitting: Psychiatry

## 2012-12-27 LAB — RAPID URINE DRUG SCREEN, HOSP PERFORMED
Amphetamines, Ur Screen: NEGATIVE (ref ?–1000)
Barbiturates, Ur Screen: NEGATIVE (ref ?–200)
Benzodiazepine, Ur Scrn: NEGATIVE (ref ?–200)
Cannabinoid 50 Ng, Ur ~~LOC~~: POSITIVE (ref ?–50)
Cocaine Metabolite,Ur ~~LOC~~: POSITIVE (ref ?–300)
Opiate, Ur Screen: NEGATIVE (ref ?–300)

## 2013-01-07 ENCOUNTER — Ambulatory Visit: Payer: Self-pay | Admitting: Psychiatry

## 2013-01-09 ENCOUNTER — Other Ambulatory Visit (INDEPENDENT_AMBULATORY_CARE_PROVIDER_SITE_OTHER): Payer: BC Managed Care – PPO | Admitting: *Deleted

## 2013-01-09 DIAGNOSIS — R319 Hematuria, unspecified: Secondary | ICD-10-CM

## 2013-01-09 LAB — POCT URINALYSIS DIPSTICK
Spec Grav, UA: 1.02
Urobilinogen, UA: NEGATIVE

## 2013-01-09 NOTE — Progress Notes (Signed)
Starting last week patient has been having increased nausea and stomach pains.  She has also had a headache.  Her urine is only showing blood and otherwise wnl.

## 2013-01-12 LAB — CULTURE, URINE COMPREHENSIVE

## 2013-02-17 ENCOUNTER — Other Ambulatory Visit (INDEPENDENT_AMBULATORY_CARE_PROVIDER_SITE_OTHER): Payer: Managed Care, Other (non HMO) | Admitting: *Deleted

## 2013-02-17 DIAGNOSIS — N390 Urinary tract infection, site not specified: Secondary | ICD-10-CM

## 2013-02-17 LAB — POCT URINALYSIS DIPSTICK
Ketones, UA: NEGATIVE
Spec Grav, UA: 1.01
Urobilinogen, UA: NEGATIVE
pH, UA: 6

## 2013-02-17 MED ORDER — NITROFURANTOIN MONOHYD MACRO 100 MG PO CAPS: 100.0000 mg | ORAL_CAPSULE | Freq: Two times a day (BID) | ORAL | Status: DC
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
## 2013-02-17 NOTE — Addendum Note (Signed)
Addended by: Barbara Cower on: 02/17/2013 02:35 PM   Modules accepted: Orders

## 2013-02-19 LAB — CULTURE, URINE COMPREHENSIVE: Colony Count: >=100000

## 2013-04-30 ENCOUNTER — Telehealth: Payer: Self-pay | Admitting: *Deleted

## 2013-04-30 DIAGNOSIS — N390 Urinary tract infection, site not specified: Secondary | ICD-10-CM

## 2013-04-30 MED ORDER — CEPHALEXIN 500 MG PO CAPS: 500.0000 mg | ORAL_CAPSULE | Freq: Four times a day (QID) | ORAL | Status: DC

## 2013-04-30 NOTE — Telephone Encounter (Signed)
Patient is having burning and pain with urination.  She has a history of uti and would like something called in for this.  She will call for an appointment if her symptoms persist.

## 2013-05-07 ENCOUNTER — Telehealth: Payer: Self-pay | Admitting: *Deleted

## 2013-05-07 DIAGNOSIS — B379 Candidiasis, unspecified: Secondary | ICD-10-CM

## 2013-05-07 MED ORDER — FLUCONAZOLE 150 MG PO TABS: 150.0000 mg | ORAL_TABLET | Freq: Once | ORAL | Status: DC

## 2013-05-07 NOTE — Telephone Encounter (Signed)
Patient is having yeast infection after taking antibiotics for a uti.  She would like diflucan called in.

## 2013-05-21 NOTE — Progress Notes (Signed)
Saw an RN only for UTI--dipstick c/w UTI-abx called in.

## 2013-06-26 ENCOUNTER — Other Ambulatory Visit (INDEPENDENT_AMBULATORY_CARE_PROVIDER_SITE_OTHER): Payer: Managed Care, Other (non HMO) | Admitting: *Deleted

## 2013-06-26 DIAGNOSIS — N390 Urinary tract infection, site not specified: Secondary | ICD-10-CM

## 2013-06-26 LAB — POCT URINALYSIS DIPSTICK
Ketones, UA: NEGATIVE
pH, UA: 6

## 2013-06-26 MED ORDER — PHENAZOPYRIDINE HCL 200 MG PO TABS: 200.0000 mg | ORAL_TABLET | Freq: Three times a day (TID) | ORAL | Status: DC | PRN

## 2013-06-26 MED ORDER — CIPROFLOXACIN HCL 500 MG PO TABS: 500.0000 mg | ORAL_TABLET | Freq: Two times a day (BID) | ORAL | Status: DC

## 2013-06-26 NOTE — Progress Notes (Signed)
Patient is having painful and frequent urination.  She feels as if it happens every time she drinks too much mountain dew soda which she did over the past week.

## 2013-06-28 LAB — URINE CULTURE: Colony Count: 75000

## 2013-09-22 ENCOUNTER — Encounter: Payer: Self-pay | Admitting: Family Medicine

## 2013-09-22 ENCOUNTER — Ambulatory Visit (INDEPENDENT_AMBULATORY_CARE_PROVIDER_SITE_OTHER): Payer: Managed Care, Other (non HMO) | Admitting: Family Medicine

## 2013-09-22 VITALS — BP 88/62 | HR 77 | Ht 64.5 in | Wt 124.8 lb

## 2013-09-22 DIAGNOSIS — Z113 Encounter for screening for infections with a predominantly sexual mode of transmission: Secondary | ICD-10-CM

## 2013-09-22 DIAGNOSIS — R300 Dysuria: Secondary | ICD-10-CM | POA: Insufficient documentation

## 2013-09-22 DIAGNOSIS — Z23 Encounter for immunization: Secondary | ICD-10-CM

## 2013-09-22 NOTE — Progress Notes (Signed)
   Subjective:    Patient ID: Sharon Chandler, female    DOB: October 06, 1991, 22 y.o.   MRN: [id number]  Urinary Tract Infection  Associated symptoms include urgency. Pertinent negatives include no chills.   Comes in with what she thinks are frequent UTI's, however, urine cultures have not really grown a urinary pathogen.  Does have white vaginal discharge and some pain associated.  She reports minimal dysuria, but some odor and hesitancy. Partner with diagnosis of hep C.  Would like testing.  No ulcers.   Review of Systems  Constitutional: Negative for fever and chills.  Respiratory: Negative for shortness of breath.   Cardiovascular: Negative for chest pain.  Gastrointestinal: Negative for abdominal pain.  Genitourinary: Positive for urgency, vaginal discharge and vaginal pain. Negative for genital sores and menstrual problem.       Objective:   Physical Exam  Vitals reviewed. Constitutional: She is oriented to person, place, and time. She appears well-developed and well-nourished.  Eyes: No scleral icterus.  Neck: Neck supple.  Cardiovascular: Normal rate.   Pulmonary/Chest: Effort normal.  Abdominal: Soft.  Genitourinary: Vagina normal.  White/clear discharge noted.  No true CMT.  Some bilateral adnexal tenderness.  IUD in place with IUD strings visualized.  Some adherent mucous to strings noted and left in situ.  Musculoskeletal: Normal range of motion.  Neurological: She is alert and oriented to person, place, and time.  Skin: Skin is warm and dry. No rash noted.          Assessment & Plan:  Flu shot today

## 2013-09-22 NOTE — Patient Instructions (Signed)

## 2013-09-22 NOTE — Assessment & Plan Note (Signed)
Unclear etiology.  Full w/u for all STD's with urine culture today.  Treat appropriately.

## 2013-09-22 NOTE — Progress Notes (Signed)
Patient is here today due to frequent urinary tract infections and she would like to be tested for sexually transmitted disease.  She recently found out that the father of her baby has Hepatitis, she is unsure of which type but it was from sharing a dirty needle with an infected person.  He has passed it to his father whom he lives with from sharing a razor to shave there face with.  He was told he has had it for a while but just found out recently.  The last time she was with him was a few months ago.

## 2013-09-23 ENCOUNTER — Telehealth: Payer: Self-pay | Admitting: *Deleted

## 2013-09-23 LAB — RPR

## 2013-09-23 LAB — HIV ANTIBODY (ROUTINE TESTING W REFLEX): HIV: NONREACTIVE

## 2013-09-23 LAB — WET PREP, GENITAL: Trich, Wet Prep: NONE SEEN

## 2013-09-23 LAB — HEPATITIS C ANTIBODY: HCV Ab: NEGATIVE

## 2013-09-23 LAB — GC/CHLAMYDIA PROBE AMP: GC Probe RNA: NEGATIVE

## 2013-09-23 NOTE — Telephone Encounter (Signed)
Message copied by Grayland Ormond on Tue Sep 23, 2013  4:35 PM ------      Message from: Reva Bores      Created: Tue Sep 23, 2013  8:28 AM       Pt. Has BV--please call in flagyl 500 mg bid x 7 d #14, no RF--other labs look good, please inform pt.--awaiting urine culture. ------

## 2013-09-23 NOTE — Telephone Encounter (Signed)
I have called in medication for pt to [facility] in [place] on [address].  Pt aware.

## 2013-10-02 ENCOUNTER — Emergency Department: Payer: Self-pay | Admitting: Emergency Medicine

## 2013-10-02 LAB — RAPID INFLUENZA A&B ANTIGENS

## 2013-10-05 LAB — BETA STREP CULTURE(ARMC)

## 2013-10-08 ENCOUNTER — Telehealth: Payer: Self-pay

## 2013-10-08 NOTE — Telephone Encounter (Signed)
Called in Dyflucan 150 mg for patient. Took antibiotic and developed a yeast infection. Called in #2 with no refills to [facility].

## 2013-10-09 DIAGNOSIS — R825 Elevated urine levels of drugs, medicaments and biological substances: Secondary | ICD-10-CM

## 2013-10-09 HISTORY — DX: Elevated urine levels of drugs, medicaments and biological substances: R82.5

## 2013-10-09 HISTORY — PX: ORIF ANKLE FRACTURE: SUR919

## 2013-11-11 DIAGNOSIS — J984 Other disorders of lung: Secondary | ICD-10-CM

## 2013-11-11 DIAGNOSIS — S82891A Other fracture of right lower leg, initial encounter for closed fracture: Secondary | ICD-10-CM

## 2013-11-11 DIAGNOSIS — S27322A Contusion of lung, bilateral, initial encounter: Secondary | ICD-10-CM | POA: Insufficient documentation

## 2013-11-11 DIAGNOSIS — R4020 Unspecified coma: Secondary | ICD-10-CM | POA: Insufficient documentation

## 2013-11-11 HISTORY — DX: Other fracture of right lower leg, initial encounter for closed fracture: S82.891A

## 2013-11-11 HISTORY — DX: Other disorders of lung: J98.4

## 2014-05-06 ENCOUNTER — Ambulatory Visit (INDEPENDENT_AMBULATORY_CARE_PROVIDER_SITE_OTHER): Payer: Medicaid Other | Admitting: Family Medicine

## 2014-05-06 ENCOUNTER — Encounter: Payer: Self-pay | Admitting: Family Medicine

## 2014-05-06 VITALS — BP 96/65 | HR 65 | Ht 64.0 in | Wt 123.0 lb

## 2014-05-06 DIAGNOSIS — B9689 Other specified bacterial agents as the cause of diseases classified elsewhere: Secondary | ICD-10-CM

## 2014-05-06 DIAGNOSIS — N760 Acute vaginitis: Secondary | ICD-10-CM

## 2014-05-06 DIAGNOSIS — Z113 Encounter for screening for infections with a predominantly sexual mode of transmission: Secondary | ICD-10-CM

## 2014-05-06 DIAGNOSIS — N898 Other specified noninflammatory disorders of vagina: Secondary | ICD-10-CM

## 2014-05-06 DIAGNOSIS — A499 Bacterial infection, unspecified: Secondary | ICD-10-CM

## 2014-05-06 MED ORDER — METRONIDAZOLE 500 MG PO TABS: 500.0000 mg | ORAL_TABLET | Freq: Two times a day (BID) | ORAL | Status: DC

## 2014-05-06 NOTE — Progress Notes (Signed)
    Subjective:    Patient ID: Sharon Chandler is a 23 y.o. female presenting with Vaginitis  on 05/06/2014  HPI: Comes in with 1 wk h/o vaginal odor, discharge which is white.  H/o BV in the past.  Denies F/C/N/V.  No vaginal irritation. New partner, would like full STD testing. Using Mirena without complaints.  Review of Systems  Constitutional: Negative for fever and chills.  Gastrointestinal: Negative for nausea, vomiting and abdominal pain.  Genitourinary: Negative for menstrual problem.  Psychiatric/Behavioral: Negative for dysphoric mood.      Objective:    BP 96/65  Pulse 65  Ht 5\' 4"  (1.626 m)  Wt 123 lb (55.792 kg)  BMI 21.10 kg/m2 Physical Exam  Constitutional: She appears well-developed and well-nourished. No distress.  HENT:  Head: Normocephalic and atraumatic.  Neck: Neck supple.  Cardiovascular: Normal rate.   Pulmonary/Chest: Effort normal.  Abdominal: Soft. There is no tenderness.  Genitourinary: Vaginal discharge (white, adherent) found.        Assessment & Plan:  Vaginal discharge - Plan: metroNIDAZOLE (FLAGYL) 500 MG tablet, GC/Chlamydia Probe Amp, Wet prep, genital  BV (bacterial vaginosis)  Screen for STD (sexually transmitted disease) - Plan: HIV antibody (with reflex), RPR, Hepatitis B surface antigen, Hepatitis C antibody    Return if symptoms worsen or fail to improve.

## 2014-05-06 NOTE — Patient Instructions (Signed)

## 2014-05-07 LAB — WET PREP, GENITAL
CLUE CELLS WET PREP: NONE SEEN
Trich, Wet Prep: NONE SEEN
WBC, Wet Prep HPF POC: NONE SEEN
Yeast Wet Prep HPF POC: NONE SEEN

## 2014-05-07 LAB — RPR

## 2014-05-07 LAB — HIV ANTIBODY (ROUTINE TESTING W REFLEX): HIV: NONREACTIVE

## 2014-05-07 LAB — HEPATITIS C ANTIBODY: HCV Ab: NEGATIVE

## 2014-05-07 LAB — HEPATITIS B SURFACE ANTIGEN: Hepatitis B Surface Ag: NEGATIVE

## 2014-05-07 LAB — GC/CHLAMYDIA PROBE AMP
CT PROBE, AMP APTIMA: NEGATIVE
GC Probe RNA: NEGATIVE

## 2014-05-08 ENCOUNTER — Telehealth: Payer: Self-pay | Admitting: *Deleted

## 2014-05-08 NOTE — Telephone Encounter (Signed)
Pt aware all normal .

## 2014-05-08 NOTE — Telephone Encounter (Signed)
Message copied by Grayland OrmondHINTON, Zakariah Urwin C on Fri May 08, 2014 11:17 AM ------      Message from: Reva BoresPRATT, TANYA S      Created: Thu May 07, 2014 10:55 AM       normal labs please inform pt. ------

## 2014-05-19 ENCOUNTER — Telehealth: Payer: Self-pay | Admitting: *Deleted

## 2014-05-19 DIAGNOSIS — B379 Candidiasis, unspecified: Secondary | ICD-10-CM

## 2014-05-19 MED ORDER — FLUCONAZOLE 150 MG PO TABS: 150.0000 mg | ORAL_TABLET | Freq: Once | ORAL | Status: DC

## 2014-05-19 NOTE — Telephone Encounter (Signed)
Patient called and is having symptoms of a yeast infection.  I have sent in Diflucan to patients pharmacy.  

## 2014-07-11 ENCOUNTER — Emergency Department: Payer: Self-pay | Admitting: Emergency Medicine

## 2014-07-11 LAB — URINALYSIS, COMPLETE
BILIRUBIN, UR: NEGATIVE
Blood: NEGATIVE
Glucose,UR: NEGATIVE mg/dL (ref 0–75)
Ketone: NEGATIVE
Nitrite: NEGATIVE
Ph: 7 (ref 4.5–8.0)
Protein: NEGATIVE
RBC,UR: 30 /HPF (ref 0–5)
Specific Gravity: 1.012 (ref 1.003–1.030)
Squamous Epithelial: 15
WBC UR: 14 /HPF (ref 0–5)

## 2014-08-10 ENCOUNTER — Encounter: Payer: Self-pay | Admitting: Family Medicine

## 2014-10-03 ENCOUNTER — Emergency Department: Payer: Self-pay | Admitting: Emergency Medicine

## 2014-10-03 LAB — URINALYSIS, COMPLETE
BACTERIA: NONE SEEN
BILIRUBIN, UR: NEGATIVE
BLOOD: NEGATIVE
Glucose,UR: NEGATIVE mg/dL (ref 0–75)
Ketone: NEGATIVE
NITRITE: NEGATIVE
PH: 7 (ref 4.5–8.0)
PROTEIN: NEGATIVE
Specific Gravity: 1.017 (ref 1.003–1.030)
WBC UR: 3 /HPF (ref 0–5)

## 2014-10-03 LAB — PREGNANCY, URINE: Pregnancy Test, Urine: NEGATIVE m[IU]/mL

## 2014-10-05 ENCOUNTER — Emergency Department: Payer: Self-pay | Admitting: Emergency Medicine

## 2014-10-05 LAB — URINALYSIS, COMPLETE
Bilirubin,UR: NEGATIVE
Blood: NEGATIVE
GLUCOSE, UR: NEGATIVE mg/dL (ref 0–75)
Ketone: NEGATIVE
Nitrite: NEGATIVE
PROTEIN: NEGATIVE
Ph: 7 (ref 4.5–8.0)
RBC,UR: 1 /HPF (ref 0–5)
Specific Gravity: 1.003 (ref 1.003–1.030)
Squamous Epithelial: 4

## 2015-02-28 ENCOUNTER — Encounter: Payer: Self-pay | Admitting: Emergency Medicine

## 2015-02-28 DIAGNOSIS — N719 Inflammatory disease of uterus, unspecified: Secondary | ICD-10-CM | POA: Insufficient documentation

## 2015-02-28 DIAGNOSIS — Z79899 Other long term (current) drug therapy: Secondary | ICD-10-CM | POA: Insufficient documentation

## 2015-02-28 DIAGNOSIS — Z720 Tobacco use: Secondary | ICD-10-CM | POA: Insufficient documentation

## 2015-02-28 DIAGNOSIS — Z793 Long term (current) use of hormonal contraceptives: Secondary | ICD-10-CM | POA: Insufficient documentation

## 2015-02-28 DIAGNOSIS — Z880 Allergy status to penicillin: Secondary | ICD-10-CM | POA: Insufficient documentation

## 2015-02-28 LAB — URINALYSIS COMPLETE WITH MICROSCOPIC (ARMC ONLY)
Bilirubin Urine: NEGATIVE
Glucose, UA: NEGATIVE mg/dL
NITRITE: NEGATIVE
Protein, ur: 30 mg/dL — AB
Specific Gravity, Urine: 1.02 (ref 1.005–1.030)
Trans Epithel, UA: <1
pH: 5 (ref 5.0–8.0)

## 2015-02-28 LAB — CBC WITH DIFFERENTIAL/PLATELET
BASOS ABS: 0.1 10*3/uL (ref 0–0.1)
BASOS PCT: 1 %
Eosinophils Absolute: 0.3 10*3/uL (ref 0–0.7)
Eosinophils Relative: 4 %
HCT: 41.5 % (ref 35.0–47.0)
Hemoglobin: 14.1 g/dL (ref 12.0–16.0)
Lymphocytes Relative: 37 %
Lymphs Abs: 2.6 10*3/uL (ref 1.0–3.6)
MCH: 31.6 pg (ref 26.0–34.0)
MCHC: 34 g/dL (ref 32.0–36.0)
MCV: 92.8 fL (ref 80.0–100.0)
MONO ABS: 0.3 10*3/uL (ref 0.2–0.9)
MONOS PCT: 4 %
Neutro Abs: 3.9 10*3/uL (ref 1.4–6.5)
Neutrophils Relative %: 54 %
PLATELETS: 223 10*3/uL (ref 150–440)
RBC: 4.47 MIL/uL (ref 3.80–5.20)
RDW: 13.5 % (ref 11.5–14.5)
WBC: 7.2 10*3/uL (ref 3.6–11.0)

## 2015-02-28 LAB — COMPREHENSIVE METABOLIC PANEL
ALBUMIN: 5 g/dL (ref 3.5–5.0)
ALT: 10 U/L — AB (ref 14–54)
ANION GAP: 5 (ref 5–15)
AST: 14 U/L — ABNORMAL LOW (ref 15–41)
Alkaline Phosphatase: 48 U/L (ref 38–126)
BUN: 14 mg/dL (ref 6–20)
CALCIUM: 9.1 mg/dL (ref 8.9–10.3)
CHLORIDE: 106 mmol/L (ref 101–111)
CO2: 26 mmol/L (ref 22–32)
CREATININE: 0.7 mg/dL (ref 0.44–1.00)
GFR calc non Af Amer: >60 mL/min (ref >60)
GLUCOSE: 89 mg/dL (ref 65–99)
Potassium: 3.6 mmol/L (ref 3.5–5.1)
Sodium: 137 mmol/L (ref 135–145)
Total Bilirubin: 0.7 mg/dL (ref 0.3–1.2)
Total Protein: 7.7 g/dL (ref 6.5–8.1)

## 2015-02-28 MED ORDER — OXYCODONE-ACETAMINOPHEN 5-325 MG PO TABS
1.0000 | ORAL_TABLET | Freq: Once | ORAL | Status: AC
  Administered 2015-02-28: 1 via ORAL

## 2015-02-28 MED ORDER — OXYCODONE-ACETAMINOPHEN 5-325 MG PO TABS
ORAL_TABLET | ORAL | Status: AC
  Filled 2015-02-28: qty 1

## 2015-02-28 NOTE — ED Notes (Signed)
Patient states that she started having lower abd pain 2-3 days ago. Patient denies nausea, vomiting or urinary symptoms. Patient states that she feels like her IUD has moved out of place.

## 2015-03-01 ENCOUNTER — Emergency Department: Payer: Medicaid Other

## 2015-03-01 ENCOUNTER — Emergency Department
Admission: EM | Admit: 2015-03-01 | Discharge: 2015-03-01 | Disposition: A | Discharge status: Home / Self Care | Payer: Self-pay | Attending: Emergency Medicine | Admitting: Emergency Medicine

## 2015-03-01 DIAGNOSIS — N719 Inflammatory disease of uterus, unspecified: Secondary | ICD-10-CM

## 2015-03-01 DIAGNOSIS — R109 Unspecified abdominal pain: Secondary | ICD-10-CM

## 2015-03-01 LAB — WET PREP, GENITAL
Clue Cells Wet Prep HPF POC: NONE SEEN
Trich, Wet Prep: NONE SEEN
Yeast Wet Prep HPF POC: NONE SEEN

## 2015-03-01 LAB — CHLAMYDIA/NGC RT PCR (ARMC ONLY)
CHLAMYDIA TR: NOT DETECTED
N gonorrhoeae: NOT DETECTED

## 2015-03-01 MED ORDER — AZITHROMYCIN 1 G PO PACK
PACK | ORAL | Status: AC
  Filled 2015-03-01: qty 1

## 2015-03-01 MED ORDER — AZITHROMYCIN 1 G PO PACK
1.0000 g | PACK | Freq: Once | ORAL | Status: AC
  Administered 2015-03-01: 1 g via ORAL

## 2015-03-01 MED ORDER — MORPHINE SULFATE 4 MG/ML IJ SOLN
4.0000 mg | Freq: Once | INTRAMUSCULAR | Status: AC
  Administered 2015-03-01: 4 mg via INTRAMUSCULAR

## 2015-03-01 MED ORDER — DOXYCYCLINE HYCLATE 100 MG PO TABS
100.0000 mg | ORAL_TABLET | Freq: Once | ORAL | Status: AC
  Administered 2015-03-01: 100 mg via ORAL

## 2015-03-01 MED ORDER — DOXYCYCLINE HYCLATE 100 MG PO TABS: 100.0000 mg | ORAL_TABLET | Freq: Two times a day (BID) | ORAL | Status: DC

## 2015-03-01 MED ORDER — DOXYCYCLINE HYCLATE 100 MG PO TABS
ORAL_TABLET | ORAL | Status: AC
  Filled 2015-03-01: qty 1

## 2015-03-01 MED ORDER — MORPHINE SULFATE 4 MG/ML IJ SOLN
INTRAMUSCULAR | Status: AC
  Filled 2015-03-01: qty 1

## 2015-03-01 MED ORDER — TRAMADOL HCL 50 MG PO TABS: 50.0000 mg | ORAL_TABLET | Freq: Four times a day (QID) | ORAL | Status: DC | PRN

## 2015-03-01 NOTE — Discharge Instructions (Signed)
Abdominal Pain Many things can cause abdominal pain. Usually, abdominal pain is not caused by a disease and will improve without treatment. It can often be observed and treated at home. Your health care provider will do a physical exam and possibly order blood tests and X-rays to help determine the seriousness of your pain. However, in many cases, more time must pass before a clear cause of the pain can be found. Before that point, your health care provider may not know if you need more testing or further treatment. HOME CARE INSTRUCTIONS  Monitor your abdominal pain for any changes. The following actions may help to alleviate any discomfort you are experiencing:  Only take over-the-counter or prescription medicines as directed by your health care provider.  Do not take laxatives unless directed to do so by your health care provider.  Try a clear liquid diet (broth, tea, or water) as directed by your health care provider. Slowly move to a bland diet as tolerated. SEEK MEDICAL CARE IF:  You have unexplained abdominal pain.  You have abdominal pain associated with nausea or diarrhea.  You have pain when you urinate or have a bowel movement.  You experience abdominal pain that wakes you in the night.  You have abdominal pain that is worsened or improved by eating food.  You have abdominal pain that is worsened with eating fatty foods.  You have a fever. SEEK IMMEDIATE MEDICAL CARE IF:   Your pain does not go away within 2 hours.  You keep throwing up (vomiting).  Your pain is felt only in portions of the abdomen, such as the right side or the left lower portion of the abdomen.  You pass bloody or black tarry stools. MAKE SURE YOU:  Understand these instructions.   Will watch your condition.   Will get help right away if you are not doing well or get worse.  Document Released: 07/05/2005 Document Revised: 09/30/2013 Document Reviewed: 06/04/2013 [facility] Patient Information  2015 [place], [place]. This information is not intended to replace advice given to you by your health care provider. Make sure you discuss any questions you have with your health care provider.  Pelvic Inflammatory Disease Pelvic inflammatory disease (PID) refers to an infection in some or all of the female organs. The infection can be in the uterus, ovaries, fallopian tubes, or the surrounding tissues in the pelvis. PID can cause abdominal or pelvic pain that comes on suddenly (acute pelvic pain). PID is a serious infection because it can lead to lasting (chronic) pelvic pain or the inability to have children (infertile).  CAUSES  The infection is often caused by the normal bacteria found in the vaginal tissues. PID may also be caused by an infection that is spread during sexual contact. PID can also occur following:   The birth of a baby.   A miscarriage.   An abortion.   Major pelvic surgery.   The use of an intrauterine device (IUD).   A sexual assault.  RISK FACTORS Certain factors can put a person at higher risk for PID, such as:  Being younger than 25 years.  Being sexually active at [place] age.  Usingnonbarrier contraception.  Havingmultiple sexual partners.  Having sex with someone who has symptoms of a genital infection.  Using oral contraception. Other times, certain behaviors can increase the possibility of getting PID, such as:  Having sex during your period.  Using a vaginal douche.  Having an intrauterine device (IUD) in place. SYMPTOMS   Abdominal  or pelvic pain.   Fever.   Chills.   Abnormal vaginal discharge.  Abnormal uterine bleeding.   Unusual pain shortly after finishing your period. DIAGNOSIS  Your caregiver will choose some of the following methods to make a diagnosis, such as:   Performinga physical exam and history. A pelvic exam typically reveals a very tender uterus and surrounding pelvis.   Ordering laboratory tests  including a pregnancy test, blood tests, and urine test.  Orderingcultures of the vagina and cervix to check for a sexually transmitted infection (STI).  Performing an ultrasound.   Performing a laparoscopic procedure to look inside the pelvis.  TREATMENT   Antibiotic medicines may be prescribed and taken by mouth.   Sexual partners may be treated when the infection is caused by a sexually transmitted disease (STD).   Hospitalization may be needed to give antibiotics intravenously.  Surgery may be needed, but this is rare. It may take weeks until you are completely well. If you are diagnosed with PID, you should also be checked for human immunodeficiency virus (HIV). HOME CARE INSTRUCTIONS   If given, take your antibiotics as directed. Finish the medicine even if you start to feel better.   Only take over-the-counter or prescription medicines for pain, discomfort, or fever as directed by your caregiver.   Do not have sexual intercourse until treatment is completed or as directed by your caregiver. If PID is confirmed, your recent sexual partner(s) will need treatment.   Keep your follow-up appointments. SEEK MEDICAL CARE IF:   You have increased or abnormal vaginal discharge.   You need prescription medicine for your pain.   You vomit.   You cannot take your medicines.   Your partner has an STD.  SEEK IMMEDIATE MEDICAL CARE IF:   You have a fever.   You have increased abdominal or pelvic pain.   You have chills.   You have pain when you urinate.   You are not better after 72 hours following treatment.  MAKE SURE YOU:   Understand these instructions.  Will watch your condition.  Will get help right away if you are not doing well or get worse. Document Released: 09/25/2005 Document Revised: 01/20/2013 Document Reviewed: 09/21/2011 [facility] Patient Information 2015 [place]. This information is not intended to replace advice given to  you by your health care provider. Make sure you discuss any questions you have with your health care provider.
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
## 2015-03-17 ENCOUNTER — Ambulatory Visit: Payer: Medicaid Other | Admitting: Obstetrics & Gynecology

## 2015-04-05 ENCOUNTER — Ambulatory Visit (INDEPENDENT_AMBULATORY_CARE_PROVIDER_SITE_OTHER): Payer: Self-pay | Admitting: Obstetrics and Gynecology

## 2015-04-05 ENCOUNTER — Encounter: Payer: Self-pay | Admitting: Obstetrics and Gynecology

## 2015-04-05 VITALS — BP 118/77 | HR 105 | Resp 16 | Ht 64.0 in | Wt 125.0 lb

## 2015-04-05 DIAGNOSIS — N760 Acute vaginitis: Secondary | ICD-10-CM

## 2015-04-05 DIAGNOSIS — N926 Irregular menstruation, unspecified: Secondary | ICD-10-CM

## 2015-04-05 DIAGNOSIS — Z30011 Encounter for initial prescription of contraceptive pills: Secondary | ICD-10-CM

## 2015-04-05 MED ORDER — NORGESTIMATE-ETH ESTRADIOL 0.25-35 MG-MCG PO TABS: 1.0000 | ORAL_TABLET | Freq: Every day | ORAL | Status: DC
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
## 2015-04-06 LAB — WET PREP, GENITAL
Trich, Wet Prep: NONE SEEN
Yeast Wet Prep HPF POC: NONE SEEN

## 2015-04-08 ENCOUNTER — Other Ambulatory Visit (INDEPENDENT_AMBULATORY_CARE_PROVIDER_SITE_OTHER): Payer: Self-pay | Admitting: *Deleted

## 2015-04-08 DIAGNOSIS — N390 Urinary tract infection, site not specified: Secondary | ICD-10-CM

## 2015-04-09 LAB — POCT URINALYSIS DIPSTICK
Bilirubin, UA: NEGATIVE
GLUCOSE UA: NEGATIVE
KETONES UA: NEGATIVE
Nitrite, UA: NEGATIVE
Spec Grav, UA: 1.02
Urobilinogen, UA: NEGATIVE
pH, UA: 6

## 2015-04-09 MED ORDER — CIPROFLOXACIN HCL 500 MG PO TABS: 500.0000 mg | ORAL_TABLET | Freq: Two times a day (BID) | ORAL | Status: DC

## 2015-04-09 NOTE — Progress Notes (Signed)
Patient walked in and wanted her urine sent off for culture.  Patient thinks she has UTI. I will start patient on Cipro.

## 2015-04-11 LAB — URINE CULTURE: Colony Count: 50000

## 2015-04-12 MED ORDER — METRONIDAZOLE 500 MG PO TABS: 500.0000 mg | ORAL_TABLET | Freq: Two times a day (BID) | ORAL | Status: AC

## 2015-04-12 NOTE — Addendum Note (Signed)
Addended by: Catalina AntiguaONSTANT, Janine Reller on: 04/12/2015 02:34 PM   Modules accepted: Orders

## 2015-04-23 ENCOUNTER — Telehealth: Payer: Self-pay | Admitting: *Deleted

## 2015-04-23 NOTE — Telephone Encounter (Signed)
I have tried to call patient several times over the past 2 weeks.  I cannot reach patient on any of the numbers listed in the chart.

## 2015-04-23 NOTE — Telephone Encounter (Signed)
-----   Message from Catalina AntiguaPeggy Constant, MD sent at 04/12/2015  2:34 PM EDT ----- Please inform patient of positive BV. FLagyl e-prescribed  Thanks  Kinder Morgan EnergyPeggy

## 2015-06-28 ENCOUNTER — Encounter: Payer: Self-pay | Admitting: Emergency Medicine

## 2015-06-28 ENCOUNTER — Emergency Department
Admission: EM | Admit: 2015-06-28 | Discharge: 2015-06-28 | Disposition: A | Discharge status: Home / Self Care | Payer: Medicaid Other | Attending: Emergency Medicine | Admitting: Emergency Medicine

## 2015-06-28 ENCOUNTER — Emergency Department: Payer: Medicaid Other

## 2015-06-28 DIAGNOSIS — Z87891 Personal history of nicotine dependence: Secondary | ICD-10-CM | POA: Insufficient documentation

## 2015-06-28 DIAGNOSIS — Z793 Long term (current) use of hormonal contraceptives: Secondary | ICD-10-CM | POA: Insufficient documentation

## 2015-06-28 DIAGNOSIS — R110 Nausea: Secondary | ICD-10-CM | POA: Diagnosis not present

## 2015-06-28 DIAGNOSIS — O9989 Other specified diseases and conditions complicating pregnancy, childbirth and the puerperium: Secondary | ICD-10-CM | POA: Diagnosis not present

## 2015-06-28 DIAGNOSIS — Z792 Long term (current) use of antibiotics: Secondary | ICD-10-CM | POA: Diagnosis not present

## 2015-06-28 DIAGNOSIS — R1030 Lower abdominal pain, unspecified: Secondary | ICD-10-CM | POA: Insufficient documentation

## 2015-06-28 DIAGNOSIS — R102 Pelvic and perineal pain: Secondary | ICD-10-CM | POA: Diagnosis not present

## 2015-06-28 DIAGNOSIS — Z3A01 Less than 8 weeks gestation of pregnancy: Secondary | ICD-10-CM | POA: Insufficient documentation

## 2015-06-28 DIAGNOSIS — Z880 Allergy status to penicillin: Secondary | ICD-10-CM | POA: Diagnosis not present

## 2015-06-28 DIAGNOSIS — Z3490 Encounter for supervision of normal pregnancy, unspecified, unspecified trimester: Secondary | ICD-10-CM

## 2015-06-28 LAB — COMPREHENSIVE METABOLIC PANEL
ALT: 15 U/L (ref 14–54)
AST: 20 U/L (ref 15–41)
Albumin: 4.5 g/dL (ref 3.5–5.0)
Alkaline Phosphatase: 48 U/L (ref 38–126)
Anion gap: 7 (ref 5–15)
BILIRUBIN TOTAL: 0.4 mg/dL (ref 0.3–1.2)
BUN: 11 mg/dL (ref 6–20)
CO2: 25 mmol/L (ref 22–32)
Calcium: 9 mg/dL (ref 8.9–10.3)
Chloride: 106 mmol/L (ref 101–111)
Creatinine, Ser: 0.74 mg/dL (ref 0.44–1.00)
Glucose, Bld: 104 mg/dL — ABNORMAL HIGH (ref 65–99)
POTASSIUM: 4 mmol/L (ref 3.5–5.1)
Sodium: 138 mmol/L (ref 135–145)
TOTAL PROTEIN: 7.2 g/dL (ref 6.5–8.1)

## 2015-06-28 LAB — URINALYSIS COMPLETE WITH MICROSCOPIC (ARMC ONLY)
BILIRUBIN URINE: NEGATIVE
Bacteria, UA: NONE SEEN
GLUCOSE, UA: NEGATIVE mg/dL
HGB URINE DIPSTICK: NEGATIVE
KETONES UR: NEGATIVE mg/dL
LEUKOCYTES UA: NEGATIVE
NITRITE: NEGATIVE
Protein, ur: 30 mg/dL — AB
SPECIFIC GRAVITY, URINE: 1.02 (ref 1.005–1.030)
pH: 7 (ref 5.0–8.0)

## 2015-06-28 LAB — CBC
HCT: 40.5 % (ref 35.0–47.0)
Hemoglobin: 13.8 g/dL (ref 12.0–16.0)
MCH: 32.1 pg (ref 26.0–34.0)
MCHC: 34.1 g/dL (ref 32.0–36.0)
MCV: 94 fL (ref 80.0–100.0)
PLATELETS: 231 10*3/uL (ref 150–440)
RBC: 4.31 MIL/uL (ref 3.80–5.20)
RDW: 13.1 % (ref 11.5–14.5)
WBC: 9 10*3/uL (ref 3.6–11.0)

## 2015-06-28 LAB — HCG, QUANTITATIVE, PREGNANCY: hCG, Beta Chain, Quant, S: 910 m[IU]/mL — ABNORMAL HIGH (ref <5)

## 2015-06-28 LAB — POCT PREGNANCY, URINE: Preg Test, Ur: POSITIVE — AB
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
## 2015-06-28 NOTE — Discharge Instructions (Signed)
First Trimester of Pregnancy The first trimester of pregnancy is from week 1 until the end of week 12 (months 1 through 3). During this time, your baby will begin to develop inside you. At 6-8 weeks, the eyes and face are formed, and the heartbeat can be seen on ultrasound. At the end of 12 weeks, all the baby's organs are formed. Prenatal care is all the medical care you receive before the birth of your baby. Make sure you get good prenatal care and follow all of your doctor's instructions. HOME CARE  Medicines  Take medicine only as told by your doctor. Some medicines are safe and some are not during pregnancy.  Take your prenatal vitamins as told by your doctor.  Take medicine that helps you poop (stool softener) as needed if your doctor says it is okay. Diet  Eat regular, healthy meals.  Your doctor will tell you the amount of weight gain that is right for you.  Avoid raw meat and uncooked cheese.  If you feel sick to your stomach (nauseous) or throw up (vomit):  Eat 4 or 5 small meals a day instead of 3 large meals.  Try eating a few soda crackers.  Drink liquids between meals instead of during meals.  If you have a hard time pooping (constipation):  Eat high-fiber foods like fresh vegetables, fruit, and whole grains.  Drink enough fluids to keep your pee (urine) clear or pale yellow. Activity and Exercise  Exercise only as told by your doctor. Stop exercising if you have cramps or pain in your lower belly (abdomen) or low back.  Try to avoid standing for long periods of time. Move your legs often if you must stand in one place for a long time.  Avoid heavy lifting.  Wear low-heeled shoes. Sit and stand up straight.  You can have sex unless your doctor tells you not to. Relief of Pain or Discomfort  Wear a good support bra if your breasts are sore.  Take warm water baths (sitz baths) to soothe pain or discomfort caused by hemorrhoids. Use hemorrhoid cream if your  doctor says it is okay.  Rest with your legs raised if you have leg cramps or low back pain.  Wear support hose if you have puffy, bulging veins (varicose veins) in your legs. Raise (elevate) your feet for 15 minutes, 3-4 times a day. Limit salt in your diet. Prenatal Care  Schedule your prenatal visits by the twelfth week of pregnancy.  Write down your questions. Take them to your prenatal visits.  Keep all your prenatal visits as told by your doctor. Safety  Wear your seat belt at all times when driving.  Make a list of emergency phone numbers. The list should include numbers for family, friends, the hospital, and police and fire departments. General Tips  Ask your doctor for a referral to a local prenatal class. Begin classes no later than at the start of month 6 of your pregnancy.  Ask for help if you need counseling or help with nutrition. Your doctor can give you advice or tell you where to go for help.  Do not use hot tubs, steam rooms, or saunas.  Do not douche or use tampons or scented sanitary pads.  Do not cross your legs for long periods of time.  Avoid litter boxes and soil used by cats.  Avoid all smoking, herbs, and alcohol. Avoid drugs not approved by your doctor.  Visit your dentist. At home, brush your teeth   with a soft toothbrush. Be gentle when you floss. GET HELP IF:  You are dizzy.  You have mild cramps or pressure in your lower belly.  You have a nagging pain in your belly area.  You continue to feel sick to your stomach, throw up, or have watery poop (diarrhea).  You have a bad smelling fluid coming from your vagina.  You have pain with peeing (urination).  You have increased puffiness (swelling) in your face, hands, legs, or ankles. GET HELP RIGHT AWAY IF:   You have a fever.  You are leaking fluid from your vagina.  You have spotting or bleeding from your vagina.  You have very bad belly cramping or pain.  You gain or lose weight  rapidly.  You throw up blood. It may look like coffee grounds.  You are around people who have German measles, fifth disease, or chickenpox.  You have a very bad headache.  You have shortness of breath.  You have any kind of trauma, such as from a fall or a car accident. Document Released: 03/13/2008 Document Revised: 02/09/2014 Document Reviewed: 08/05/2013 ExitCare Patient Information 2015 [employer]. This information is not intended to replace advice given to you by your health care provider. Make sure you discuss any questions you have with your health care provider.  

## 2015-06-28 NOTE — ED Notes (Signed)
Patient reports having lower abdominal/pelvic pain for last few days. Denies any recent fever or diarrhea. +Nausea. No vomiting as of yet.

## 2015-07-02 ENCOUNTER — Telehealth: Payer: Self-pay | Admitting: Obstetrics & Gynecology

## 2015-07-02 ENCOUNTER — Other Ambulatory Visit (INDEPENDENT_AMBULATORY_CARE_PROVIDER_SITE_OTHER): Payer: Medicaid Other

## 2015-07-02 DIAGNOSIS — N912 Amenorrhea, unspecified: Secondary | ICD-10-CM | POA: Diagnosis not present

## 2015-07-02 DIAGNOSIS — Z3491 Encounter for supervision of normal pregnancy, unspecified, first trimester: Secondary | ICD-10-CM

## 2015-07-02 MED ORDER — CONCEPT OB 130-92.4-1 MG PO CAPS: 1.0000 | ORAL_CAPSULE | Freq: Every day | ORAL | Status: DC

## 2015-07-02 NOTE — Telephone Encounter (Signed)
Pt was hoping to get an rx for prenatal vitamins. She uses the [facility] on [address]

## 2015-07-02 NOTE — Telephone Encounter (Signed)
Sent rx to pharmacy

## 2015-07-03 LAB — HCG, QUANTITATIVE, PREGNANCY: hCG, Beta Chain, Quant, S: 3990.5 m[IU]/mL

## 2015-07-05 ENCOUNTER — Telehealth: Payer: Self-pay | Admitting: *Deleted

## 2015-07-05 ENCOUNTER — Inpatient Hospital Stay (HOSPITAL_COMMUNITY)
Admission: AD | Admit: 2015-07-05 | Discharge: 2015-07-05 | Disposition: A | Discharge status: Home / Self Care | Payer: Medicaid Other | Source: Ambulatory Visit | Attending: Family Medicine | Admitting: Family Medicine

## 2015-07-05 ENCOUNTER — Emergency Department
Admission: EM | Admit: 2015-07-05 | Discharge: 2015-07-05 | Disposition: A | Discharge status: Home / Self Care | Payer: Medicaid Other

## 2015-07-05 ENCOUNTER — Encounter (HOSPITAL_COMMUNITY): Payer: Self-pay | Admitting: *Deleted

## 2015-07-05 ENCOUNTER — Inpatient Hospital Stay (HOSPITAL_COMMUNITY): Payer: Medicaid Other

## 2015-07-05 DIAGNOSIS — Z3A08 8 weeks gestation of pregnancy: Secondary | ICD-10-CM | POA: Insufficient documentation

## 2015-07-05 DIAGNOSIS — O209 Hemorrhage in early pregnancy, unspecified: Secondary | ICD-10-CM | POA: Diagnosis not present

## 2015-07-05 DIAGNOSIS — Z87891 Personal history of nicotine dependence: Secondary | ICD-10-CM | POA: Insufficient documentation

## 2015-07-05 DIAGNOSIS — O4691 Antepartum hemorrhage, unspecified, first trimester: Secondary | ICD-10-CM | POA: Diagnosis not present

## 2015-07-05 DIAGNOSIS — R109 Unspecified abdominal pain: Secondary | ICD-10-CM | POA: Diagnosis present

## 2015-07-05 NOTE — MAU Note (Signed)
Was at work and felt some watery- wetness; also was having some sharp little pains- when went to the bathroom there was blood.  No longer having the pain

## 2015-07-05 NOTE — Discharge Instructions (Signed)
Vaginal Bleeding During Pregnancy, First Trimester °A small amount of bleeding (spotting) from the vagina is common in early pregnancy. Sometimes the bleeding is normal and is not a problem, and sometimes it is a sign of something serious. Be sure to tell your doctor about any bleeding from your vagina right away. °HOME CARE °· Watch your condition for any changes. °· Follow your doctor's instructions about how active you can be. °· If you are on bed rest: °· You may need to stay in bed and only get up to use the bathroom. °· You may be allowed to do some activities. °· If you need help, make plans for someone to help you. °· Write down: °· The number of pads you use each day. °· How often you change pads. °· How soaked (saturated) your pads are. °· Do not use tampons. °· Do not douche. °· Do not have sex or orgasms until your doctor says it is okay. °· If you pass any tissue from your vagina, save the tissue so you can show it to your doctor. °· Only take medicines as told by your doctor. °· Do not take aspirin because it can make you bleed. °· Keep all follow-up visits as told by your doctor. °GET HELP IF:  °· You bleed from your vagina. °· You have cramps. °· You have labor pains. °· You have a fever that does not go away after you take medicine. °GET HELP RIGHT AWAY IF:  °· You have very bad cramps in your back or belly (abdomen). °· You pass large clots or tissue from your vagina. °· You bleed more. °· You feel light-headed or weak. °· You pass out (faint). °· You have chills. °· You are leaking fluid or have a gush of fluid from your vagina. °· You pass out while pooping (having a bowel movement). °MAKE SURE YOU: °· Understand these instructions. °· Will watch your condition. °· Will get help right away if you are not doing well or get worse. °Document Released: 02/09/2014 Document Reviewed: 06/02/2013 °ExitCare® Patient Information ©2015 [employer]. This information is not intended to replace advice given  to you by your health care provider. Make sure you discuss any questions you have with your health care provider. °Pelvic Rest °Pelvic rest is sometimes recommended for women when:  °· The placenta is partially or completely covering the opening of the cervix (placenta previa). °· There is bleeding between the uterine wall and the amniotic sac in the first trimester (subchorionic hemorrhage). °· The cervix begins to open without labor starting (incompetent cervix, cervical insufficiency). °· The labor is too early (preterm labor). °HOME CARE INSTRUCTIONS °· Do not have sexual intercourse, stimulation, or an orgasm. °· Do not use tampons, douche, or put anything in the vagina. °· Do not lift anything over 10 pounds (4.5 kg). °· Avoid strenuous activity or straining your pelvic muscles. °SEEK MEDICAL CARE IF:  °· You have any vaginal bleeding during pregnancy. Treat this as a potential emergency. °· You have cramping pain felt low in the stomach (stronger than menstrual cramps). °· You notice vaginal discharge (watery, mucus, or bloody). °· You have a low, dull backache. °· There are regular contractions or uterine tightening. °SEEK IMMEDIATE MEDICAL CARE IF: °You have vaginal bleeding and have placenta previa.  °Document Released: 01/20/2011 Document Revised: 12/18/2011 Document Reviewed: 01/20/2011 °ExitCare® Patient Information ©2015 [employer]. This information is not intended to replace advice given to you by your health care provider. Make sure   sure you discuss any questions you have with your health care provider.
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
## 2015-07-05 NOTE — MAU Note (Signed)
Pt states was at work at 1230 and felt sharp pains in vagina. Went to restroom and saw watery blood. In bathroom blood was light brown.

## 2015-07-05 NOTE — Telephone Encounter (Signed)
Pt called requesting result for BHCG, level has tripled from last blood draw.  Pt has initial OB appt on 07-14-15, informed pt to call office if she starts to experience vaginal bleeding or severe abdominal pain. Pt acknowledged.

## 2015-07-05 NOTE — MAU Note (Signed)
Urine in lab 

## 2015-07-06 ENCOUNTER — Telehealth: Payer: Self-pay | Admitting: Emergency Medicine

## 2015-07-06 NOTE — ED Notes (Signed)
Called patient due to lwot to inquire about condition and follow up plans. Pt says she did go to doctor and her baby is okay.

## 2015-07-14 ENCOUNTER — Other Ambulatory Visit (HOSPITAL_COMMUNITY)
Admission: RE | Admit: 2015-07-14 | Discharge: 2015-07-14 | Disposition: A | Discharge status: Home / Self Care | Payer: Medicaid Other | Source: Ambulatory Visit | Attending: Certified Nurse Midwife | Admitting: Certified Nurse Midwife

## 2015-07-14 ENCOUNTER — Ambulatory Visit (INDEPENDENT_AMBULATORY_CARE_PROVIDER_SITE_OTHER): Payer: Medicaid Other | Admitting: Certified Nurse Midwife

## 2015-07-14 VITALS — BP 114/81 | HR 94 | Wt 128.0 lb

## 2015-07-14 DIAGNOSIS — Z113 Encounter for screening for infections with a predominantly sexual mode of transmission: Secondary | ICD-10-CM | POA: Insufficient documentation

## 2015-07-14 DIAGNOSIS — R87810 Cervical high risk human papillomavirus (HPV) DNA test positive: Secondary | ICD-10-CM | POA: Diagnosis present

## 2015-07-14 DIAGNOSIS — Z1151 Encounter for screening for human papillomavirus (HPV): Secondary | ICD-10-CM | POA: Insufficient documentation

## 2015-07-14 DIAGNOSIS — Z01419 Encounter for gynecological examination (general) (routine) without abnormal findings: Secondary | ICD-10-CM | POA: Insufficient documentation

## 2015-07-14 DIAGNOSIS — O0990 Supervision of high risk pregnancy, unspecified, unspecified trimester: Secondary | ICD-10-CM | POA: Insufficient documentation

## 2015-07-14 DIAGNOSIS — Z3491 Encounter for supervision of normal pregnancy, unspecified, first trimester: Secondary | ICD-10-CM

## 2015-07-14 DIAGNOSIS — Z3481 Encounter for supervision of other normal pregnancy, first trimester: Secondary | ICD-10-CM
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
## 2015-07-14 NOTE — Progress Notes (Signed)
Pt c/o occasional nausea.

## 2015-07-14 NOTE — Patient Instructions (Signed)
First Trimester of Pregnancy The first trimester of pregnancy is from week 1 until the end of week 12 (months 1 through 3). A week after a sperm fertilizes an egg, the egg will implant on the wall of the uterus. This embryo will begin to develop into a baby. Genes from you and your partner are forming the baby. The female genes determine whether the baby is a boy or a girl. At 6-8 weeks, the eyes and face are formed, and the heartbeat can be seen on ultrasound. At the end of 12 weeks, all the baby's organs are formed.  Now that you are pregnant, you will want to do everything you can to have a healthy baby. Two of the most important things are to get good prenatal care and to follow your health care provider's instructions. Prenatal care is all the medical care you receive before the baby's birth. This care will help prevent, find, and treat any problems during the pregnancy and childbirth. BODY CHANGES Your body goes through many changes during pregnancy. The changes vary from woman to woman.   You may gain or lose a couple of pounds at first.  You may feel sick to your stomach (nauseous) and throw up (vomit). If the vomiting is uncontrollable, call your health care provider.  You may tire easily.  You may develop headaches that can be relieved by medicines approved by your health care provider.  You may urinate more often. Painful urination may mean you have a bladder infection.  You may develop heartburn as a result of your pregnancy.  You may develop constipation because certain hormones are causing the muscles that push waste through your intestines to slow down.  You may develop hemorrhoids or swollen, bulging veins (varicose veins).  Your breasts may begin to grow larger and become tender. Your nipples may stick out more, and the tissue that surrounds them (areola) may become darker.  Your gums may bleed and may be sensitive to brushing and flossing.  Dark spots or blotches (chloasma,  mask of pregnancy) may develop on your face. This will likely fade after the baby is born.  Your menstrual periods will stop.  You may have a loss of appetite.  You may develop cravings for certain kinds of food.  You may have changes in your emotions from day to day, such as being excited to be pregnant or being concerned that something may go wrong with the pregnancy and baby.  You may have more vivid and strange dreams.  You may have changes in your hair. These can include thickening of your hair, rapid growth, and changes in texture. Some women also have hair loss during or after pregnancy, or hair that feels dry or thin. Your hair will most likely return to normal after your baby is born. WHAT TO EXPECT AT YOUR PRENATAL VISITS During a routine prenatal visit:  You will be weighed to make sure you and the baby are growing normally.  Your blood pressure will be taken.  Your abdomen will be measured to track your baby's growth.  The fetal heartbeat will be listened to starting around week 10 or 12 of your pregnancy.  Test results from any previous visits will be discussed. Your health care provider may ask you:  How you are feeling.  If you are feeling the baby move.  If you have had any abnormal symptoms, such as leaking fluid, bleeding, severe headaches, or abdominal cramping.  If you are using any tobacco products,   including cigarettes, chewing tobacco, and electronic cigarettes.  If you have any questions. Other tests that may be performed during your first trimester include:  Blood tests to find your blood type and to check for the presence of any previous infections. They will also be used to check for low iron levels (anemia) and Rh antibodies. Later in the pregnancy, blood tests for diabetes will be done along with other tests if problems develop.  Urine tests to check for infections, diabetes, or protein in the urine.  An ultrasound to confirm the proper growth  and development of the baby.  An amniocentesis to check for possible genetic problems.  Fetal screens for spina bifida and Down syndrome.  You may need other tests to make sure you and the baby are doing well.  HIV (human immunodeficiency virus) testing. Routine prenatal testing includes screening for HIV, unless you choose not to have this test. HOME CARE INSTRUCTIONS  Medicines  Follow your health care provider's instructions regarding medicine use. Specific medicines may be either safe or unsafe to take during pregnancy.  Take your prenatal vitamins as directed.  If you develop constipation, try taking a stool softener if your health care provider approves. Diet  Eat regular, well-balanced meals. Choose a variety of foods, such as meat or vegetable-based protein, fish, milk and low-fat dairy products, vegetables, fruits, and whole grain breads and cereals. Your health care provider will help you determine the amount of weight gain that is right for you.  Avoid raw meat and uncooked cheese. These carry germs that can cause birth defects in the baby.  Eating four or five small meals rather than three large meals a day may help relieve nausea and vomiting. If you start to feel nauseous, eating a few soda crackers can be helpful. Drinking liquids between meals instead of during meals also seems to help nausea and vomiting.  If you develop constipation, eat more high-fiber foods, such as fresh vegetables or fruit and whole grains. Drink enough fluids to keep your urine clear or pale yellow. Activity and Exercise  Exercise only as directed by your health care provider. Exercising will help you:  Control your weight.  Stay in shape.  Be prepared for labor and delivery.  Experiencing pain or cramping in the lower abdomen or low back is a good sign that you should stop exercising. Check with your health care provider before continuing normal exercises.  Try to avoid standing for long  periods of time. Move your legs often if you must stand in one place for a long time.  Avoid heavy lifting.  Wear low-heeled shoes, and practice good posture.  You may continue to have sex unless your health care provider directs you otherwise. Relief of Pain or Discomfort  Wear a good support bra for breast tenderness.   Take warm sitz baths to soothe any pain or discomfort caused by hemorrhoids. Use hemorrhoid cream if your health care provider approves.   Rest with your legs elevated if you have leg cramps or low back pain.  If you develop varicose veins in your legs, wear support hose. Elevate your feet for 15 minutes, 3-4 times a day. Limit salt in your diet. Prenatal Care  Schedule your prenatal visits by the twelfth week of pregnancy. They are usually scheduled monthly at first, then more often in the last 2 months before delivery.  Write down your questions. Take them to your prenatal visits.  Keep all your prenatal visits as directed by your   health care provider. Safety  Wear your seat belt at all times when driving.  Make a list of emergency phone numbers, including numbers for family, friends, the hospital, and police and fire departments. General Tips  Ask your health care provider for a referral to a local prenatal education class. Begin classes no later than at the beginning of month 6 of your pregnancy.  Ask for help if you have counseling or nutritional needs during pregnancy. Your health care provider can offer advice or refer you to specialists for help with various needs.  Do not use hot tubs, steam rooms, or saunas.  Do not douche or use tampons or scented sanitary pads.  Do not cross your legs for long periods of time.  Avoid cat litter boxes and soil used by cats. These carry germs that can cause birth defects in the baby and possibly loss of the fetus by miscarriage or stillbirth.  Avoid all smoking, herbs, alcohol, and medicines not prescribed by  your health care provider. Chemicals in these affect the formation and growth of the baby.  Do not use any tobacco products, including cigarettes, chewing tobacco, and electronic cigarettes. If you need help quitting, ask your health care provider. You may receive counseling support and other resources to help you quit.  Schedule a dentist appointment. At home, brush your teeth with a soft toothbrush and be gentle when you floss. SEEK MEDICAL CARE IF:   You have dizziness.  You have mild pelvic cramps, pelvic pressure, or nagging pain in the abdominal area.  You have persistent nausea, vomiting, or diarrhea.  You have a bad smelling vaginal discharge.  You have pain with urination.  You notice increased swelling in your face, hands, legs, or ankles. SEEK IMMEDIATE MEDICAL CARE IF:   You have a fever.  You are leaking fluid from your vagina.  You have spotting or bleeding from your vagina.  You have severe abdominal cramping or pain.  You have rapid weight gain or loss.  You vomit blood or material that looks like coffee grounds.  You are exposed to German measles and have never had them.  You are exposed to fifth disease or chickenpox.  You develop a severe headache.  You have shortness of breath.  You have any kind of trauma, such as from a fall or a car accident.   This information is not intended to replace advice given to you by your health care provider. Make sure you discuss any questions you have with your health care provider.   Document Released: 09/19/2001 Document Revised: 10/16/2014 Document Reviewed: 08/05/2013 [employer] Interactive Patient Education 2016 [employer]  

## 2015-07-15 LAB — PRENATAL PROFILE (SOLSTAS)
Antibody Screen: NEGATIVE
Basophils Absolute: 0 10*3/uL (ref 0.0–0.1)
Basophils Relative: 0 % (ref 0–1)
Eosinophils Absolute: 0.3 10*3/uL (ref 0.0–0.7)
Eosinophils Relative: 3 % (ref 0–5)
HCT: 37.7 % (ref 36.0–46.0)
HIV 1&2 Ab, 4th Generation: NONREACTIVE
Hemoglobin: 12.7 g/dL (ref 12.0–15.0)
Hepatitis B Surface Ag: NEGATIVE
Lymphocytes Relative: 20 % (ref 12–46)
Lymphs Abs: 1.9 10*3/uL (ref 0.7–4.0)
MCH: 31.4 pg (ref 26.0–34.0)
MCHC: 33.7 g/dL (ref 30.0–36.0)
MCV: 93.3 fL (ref 78.0–100.0)
MPV: 10.2 fL (ref 8.6–12.4)
Monocytes Absolute: 0.5 10*3/uL (ref 0.1–1.0)
Monocytes Relative: 5 % (ref 3–12)
Neutro Abs: 6.9 10*3/uL (ref 1.7–7.7)
Neutrophils Relative %: 72 % (ref 43–77)
Platelets: 261 10*3/uL (ref 150–400)
RBC: 4.04 MIL/uL (ref 3.87–5.11)
RDW: 12.6 % (ref 11.5–15.5)
Rh Type: POSITIVE
Rubella: 3.22 Index — ABNORMAL HIGH (ref <0.90)
WBC: 9.6 10*3/uL (ref 4.0–10.5)

## 2015-07-16 LAB — CULTURE, OB URINE
Colony Count: NO GROWTH
Organism ID, Bacteria: NO GROWTH

## 2015-07-19 LAB — CYTOLOGY - PAP

## 2015-07-27 ENCOUNTER — Encounter: Payer: Medicaid Other | Admitting: Obstetrics & Gynecology

## 2015-07-29 ENCOUNTER — Encounter: Payer: Self-pay | Admitting: *Deleted

## 2015-07-29 ENCOUNTER — Ambulatory Visit (INDEPENDENT_AMBULATORY_CARE_PROVIDER_SITE_OTHER): Payer: Medicaid Other | Admitting: Family Medicine

## 2015-07-29 ENCOUNTER — Encounter: Payer: Self-pay | Admitting: Family Medicine

## 2015-07-29 ENCOUNTER — Telehealth: Payer: Self-pay | Admitting: *Deleted

## 2015-07-29 VITALS — BP 104/69 | HR 69 | Wt 129.0 lb

## 2015-07-29 DIAGNOSIS — Z3481 Encounter for supervision of other normal pregnancy, first trimester: Secondary | ICD-10-CM | POA: Diagnosis not present

## 2015-07-29 DIAGNOSIS — Z3491 Encounter for supervision of normal pregnancy, unspecified, first trimester: Secondary | ICD-10-CM

## 2015-07-29 DIAGNOSIS — O219 Vomiting of pregnancy, unspecified: Secondary | ICD-10-CM

## 2015-07-29 MED ORDER — DOXYLAMINE-PYRIDOXINE 10-10 MG PO TBEC: DELAYED_RELEASE_TABLET | ORAL | Status: DC

## 2015-07-29 NOTE — Assessment & Plan Note (Signed)
BABYSCRIPTS PATIENT: [x ] initial, [ x] 12, [ ] 20, [ ] 28, [ ] 32, [ ] 36, [ ] 38, [ ] 39, [ ] 40 

## 2015-07-29 NOTE — Telephone Encounter (Signed)
Order placed for anatomy US, pt on baby scripts will not return to office until 20 wks

## 2015-07-29 NOTE — Patient Instructions (Signed)
Second Trimester of Pregnancy The second trimester is from week 13 through week 28, months 4 through 6. The second trimester is often a time when you feel your best. Your body has also adjusted to being pregnant, and you begin to feel better physically. Usually, morning sickness has lessened or quit completely, you may have more energy, and you may have an increase in appetite. The second trimester is also a time when the fetus is growing rapidly. At the end of the sixth month, the fetus is about 9 inches long and weighs about 1 pounds. You will likely begin to feel the baby move (quickening) between 18 and 20 weeks of the pregnancy. BODY CHANGES Your body goes through many changes during pregnancy. The changes vary from woman to woman.   Your weight will continue to increase. You will notice your lower abdomen bulging out.  You may begin to get stretch marks on your hips, abdomen, and breasts.  You may develop headaches that can be relieved by medicines approved by your health care provider.  You may urinate more often because the fetus is pressing on your bladder.  You may develop or continue to have heartburn as a result of your pregnancy.  You may develop constipation because certain hormones are causing the muscles that push waste through your intestines to slow down.  You may develop hemorrhoids or swollen, bulging veins (varicose veins).  You may have back pain because of the weight gain and pregnancy hormones relaxing your joints between the bones in your pelvis and as a result of a shift in weight and the muscles that support your balance.  Your breasts will continue to grow and be tender.  Your gums may bleed and may be sensitive to brushing and flossing.  Dark spots or blotches (chloasma, mask of pregnancy) may develop on your face. This will likely fade after the baby is born.  A dark line from your belly button to the pubic area (linea nigra) may appear. This will likely  fade after the baby is born.  You may have changes in your hair. These can include thickening of your hair, rapid growth, and changes in texture. Some women also have hair loss during or after pregnancy, or hair that feels dry or thin. Your hair will most likely return to normal after your baby is born. WHAT TO EXPECT AT YOUR PRENATAL VISITS During a routine prenatal visit:  You will be weighed to make sure you and the fetus are growing normally.  Your blood pressure will be taken.  Your abdomen will be measured to track your baby's growth.  The fetal heartbeat will be listened to.  Any test results from the previous visit will be discussed. Your health care provider may ask you:  How you are feeling.  If you are feeling the baby move.  If you have had any abnormal symptoms, such as leaking fluid, bleeding, severe headaches, or abdominal cramping.  If you are using any tobacco products, including cigarettes, chewing tobacco, and electronic cigarettes.  If you have any questions. Other tests that may be performed during your second trimester include:  Blood tests that check for:  Low iron levels (anemia).  Gestational diabetes (between 24 and 28 weeks).  Rh antibodies.  Urine tests to check for infections, diabetes, or protein in the urine.  An ultrasound to confirm the proper growth and development of the baby.  An amniocentesis to check for possible genetic problems.  Fetal screens for spina bifida   and Down syndrome.  HIV (human immunodeficiency virus) testing. Routine prenatal testing includes screening for HIV, unless you choose not to have this test. HOME CARE INSTRUCTIONS   Avoid all smoking, herbs, alcohol, and unprescribed drugs. These chemicals affect the formation and growth of the baby.  Do not use any tobacco products, including cigarettes, chewing tobacco, and electronic cigarettes. If you need help quitting, ask your health care provider. You may receive  counseling support and other resources to help you quit.  Follow your health care provider's instructions regarding medicine use. There are medicines that are either safe or unsafe to take during pregnancy.  Exercise only as directed by your health care provider. Experiencing uterine cramps is a good sign to stop exercising.  Continue to eat regular, healthy meals.  Wear a good support bra for breast tenderness.  Do not use hot tubs, steam rooms, or saunas.  Wear your seat belt at all times when driving.  Avoid raw meat, uncooked cheese, cat litter boxes, and soil used by cats. These carry germs that can cause birth defects in the baby.  Take your prenatal vitamins.  Take 1500-2000 mg of calcium daily starting at the 20th week of pregnancy until you deliver your baby.  Try taking a stool softener (if your health care provider approves) if you develop constipation. Eat more high-fiber foods, such as fresh vegetables or fruit and whole grains. Drink plenty of fluids to keep your urine clear or pale yellow.  Take warm sitz baths to soothe any pain or discomfort caused by hemorrhoids. Use hemorrhoid cream if your health care provider approves.  If you develop varicose veins, wear support hose. Elevate your feet for 15 minutes, 3-4 times a day. Limit salt in your diet.  Avoid heavy lifting, wear low heel shoes, and practice good posture.  Rest with your legs elevated if you have leg cramps or low back pain.  Visit your dentist if you have not gone yet during your pregnancy. Use a soft toothbrush to brush your teeth and be gentle when you floss.  A sexual relationship may be continued unless your health care provider directs you otherwise.  Continue to go to all your prenatal visits as directed by your health care provider. SEEK MEDICAL CARE IF:   You have dizziness.  You have mild pelvic cramps, pelvic pressure, or nagging pain in the abdominal area.  You have persistent nausea,  vomiting, or diarrhea.  You have a bad smelling vaginal discharge.  You have pain with urination. SEEK IMMEDIATE MEDICAL CARE IF:   You have a fever.  You are leaking fluid from your vagina.  You have spotting or bleeding from your vagina.  You have severe abdominal cramping or pain.  You have rapid weight gain or loss.  You have shortness of breath with chest pain.  You notice sudden or extreme swelling of your face, hands, ankles, feet, or legs.  You have not felt your baby move in over an hour.  You have severe headaches that do not go away with medicine.  You have vision changes.   This information is not intended to replace advice given to you by your health care provider. Make sure you discuss any questions you have with your health care provider.   Document Released: 09/19/2001 Document Revised: 10/16/2014 Document Reviewed: 11/26/2012 Elsevier Interactive Patient Education 2016 [employer]   Breastfeeding Deciding to breastfeed is one of the best choices you can make for you and your baby. A change   in hormones during pregnancy causes your breast tissue to grow and increases the number and size of your milk ducts. These hormones also allow proteins, sugars, and fats from your blood supply to make breast milk in your milk-producing glands. Hormones prevent breast milk from being released before your baby is born as well as prompt milk flow after birth. Once breastfeeding has begun, thoughts of your baby, as well as his or her sucking or crying, can stimulate the release of milk from your milk-producing glands.  BENEFITS OF BREASTFEEDING For Your Baby  Your first milk (colostrum) helps your baby's digestive system function better.  There are antibodies in your milk that help your baby fight off infections.  Your baby has a lower incidence of asthma, allergies, and sudden infant death syndrome.  The nutrients in breast milk are better for your baby than infant  formulas and are designed uniquely for your baby's needs.  Breast milk improves your baby's brain development.  Your baby is less likely to develop other conditions, such as childhood obesity, asthma, or type 2 diabetes mellitus. For You  Breastfeeding helps to create a very special bond between you and your baby.  Breastfeeding is convenient. Breast milk is always available at the correct temperature and costs nothing.  Breastfeeding helps to burn calories and helps you lose the weight gained during pregnancy.  Breastfeeding makes your uterus contract to its prepregnancy size faster and slows bleeding (lochia) after you give birth.   Breastfeeding helps to lower your risk of developing type 2 diabetes mellitus, osteoporosis, and breast or ovarian cancer later in life. SIGNS THAT YOUR BABY IS HUNGRY Early Signs of Hunger  Increased alertness or activity.  Stretching.  Movement of the head from side to side.  Movement of the head and opening of the mouth when the corner of the mouth or cheek is stroked (rooting).  Increased sucking sounds, smacking lips, cooing, sighing, or squeaking.  Hand-to-mouth movements.  Increased sucking of fingers or hands. Late Signs of Hunger  Fussing.  Intermittent crying. Extreme Signs of Hunger Signs of extreme hunger will require calming and consoling before your baby will be able to breastfeed successfully. Do not wait for the following signs of extreme hunger to occur before you initiate breastfeeding:  Restlessness.  A loud, strong cry.  Screaming. BREASTFEEDING BASICS Breastfeeding Initiation  Find a comfortable place to sit or lie down, with your neck and back well supported.  Place a pillow or rolled up blanket under your baby to bring him or her to the level of your breast (if you are seated). Nursing pillows are specially designed to help support your arms and your baby while you breastfeed.  Make sure that your baby's  abdomen is facing your abdomen.  Gently massage your breast. With your fingertips, massage from your chest wall toward your nipple in a circular motion. This encourages milk flow. You may need to continue this action during the feeding if your milk flows slowly.  Support your breast with 4 fingers underneath and your thumb above your nipple. Make sure your fingers are well away from your nipple and your baby's mouth.  Stroke your baby's lips gently with your finger or nipple.  When your baby's mouth is open wide enough, quickly bring your baby to your breast, placing your entire nipple and as much of the colored area around your nipple (areola) as possible into your baby's mouth.  More areola should be visible above your baby's upper lip than   below the lower lip.  Your baby's tongue should be between his or her lower gum and your breast.  Ensure that your baby's mouth is correctly positioned around your nipple (latched). Your baby's lips should create a seal on your breast and be turned out (everted).  It is common for your baby to suck about 2-3 minutes in order to start the flow of breast milk. Latching Teaching your baby how to latch on to your breast properly is very important. An improper latch can cause nipple pain and decreased milk supply for you and poor weight gain in your baby. Also, if your baby is not latched onto your nipple properly, he or she may swallow some air during feeding. This can make your baby fussy. Burping your baby when you switch breasts during the feeding can help to get rid of the air. However, teaching your baby to latch on properly is still the best way to prevent fussiness from swallowing air while breastfeeding. Signs that your baby has successfully latched on to your nipple:  Silent tugging or silent sucking, without causing you pain.  Swallowing heard between every 3-4 sucks.  Muscle movement above and in front of his or her ears while sucking. Signs  that your baby has not successfully latched on to nipple:  Sucking sounds or smacking sounds from your baby while breastfeeding.  Nipple pain. If you think your baby has not latched on correctly, slip your finger into the corner of your baby's mouth to break the suction and place it between your baby's gums. Attempt breastfeeding initiation again. Signs of Successful Breastfeeding Signs from your baby:  A gradual decrease in the number of sucks or complete cessation of sucking.  Falling asleep.  Relaxation of his or her body.  Retention of a small amount of milk in his or her mouth.  Letting go of your breast by himself or herself. Signs from you:  Breasts that have increased in firmness, weight, and size 1-3 hours after feeding.  Breasts that are softer immediately after breastfeeding.  Increased milk volume, as well as a change in milk consistency and color by the fifth day of breastfeeding.  Nipples that are not sore, cracked, or bleeding. Signs That Your Baby is Getting Enough Milk  Wetting at least 3 diapers in a 24-hour period. The urine should be clear and pale yellow by age 5 days.  At least 3 stools in a 24-hour period by age 5 days. The stool should be soft and yellow.  At least 3 stools in a 24-hour period by age 7 days. The stool should be seedy and yellow.  No loss of weight greater than 10% of birth weight during the first 3 days of age.  Average weight gain of 4-7 ounces (113-198 g) per week after age 4 days.  Consistent daily weight gain by age 5 days, without weight loss after the age of 2 weeks. After a feeding, your baby may spit up a small amount. This is common. BREASTFEEDING FREQUENCY AND DURATION Frequent feeding will help you make more milk and can prevent sore nipples and breast engorgement. Breastfeed when you feel the need to reduce the fullness of your breasts or when your baby shows signs of hunger. This is called "breastfeeding on demand." Avoid  introducing a pacifier to your baby while you are working to establish breastfeeding (the first 4-6 weeks after your baby is born). After this time you may choose to use a pacifier. Research has shown that   pacifier use during the first year of a baby's life decreases the risk of sudden infant death syndrome (SIDS). Allow your baby to feed on each breast as long as he or she wants. Breastfeed until your baby is finished feeding. When your baby unlatches or falls asleep while feeding from the first breast, offer the second breast. Because newborns are often sleepy in the first few weeks of life, you may need to awaken your baby to get him or her to feed. Breastfeeding times will vary from baby to baby. However, the following rules can serve as a guide to help you ensure that your baby is properly fed:  Newborns (babies 4 weeks of age or younger) may breastfeed every 1-3 hours.  Newborns should not go longer than 3 hours during the day or 5 hours during the night without breastfeeding.  You should breastfeed your baby a minimum of 8 times in a 24-hour period until you begin to introduce solid foods to your baby at around 6 months of age. BREAST MILK PUMPING Pumping and storing breast milk allows you to ensure that your baby is exclusively fed your breast milk, even at times when you are unable to breastfeed. This is especially important if you are going back to work while you are still breastfeeding or when you are not able to be present during feedings. Your lactation consultant can give you guidelines on how long it is safe to store breast milk. A breast pump is a machine that allows you to pump milk from your breast into a sterile bottle. The pumped breast milk can then be stored in a refrigerator or freezer. Some breast pumps are operated by hand, while others use electricity. Ask your lactation consultant which type will work best for you. Breast pumps can be purchased, but some hospitals and  breastfeeding support groups lease breast pumps on a monthly basis. A lactation consultant can teach you how to hand express breast milk, if you prefer not to use a pump. CARING FOR YOUR BREASTS WHILE YOU BREASTFEED Nipples can become dry, cracked, and sore while breastfeeding. The following recommendations can help keep your breasts moisturized and healthy:  Avoid using soap on your nipples.  Wear a supportive bra. Although not required, special nursing bras and tank tops are designed to allow access to your breasts for breastfeeding without taking off your entire bra or top. Avoid wearing underwire-style bras or extremely tight bras.  Air dry your nipples for 3-4minutes after each feeding.  Use only cotton bra pads to absorb leaked breast milk. Leaking of breast milk between feedings is normal.  Use lanolin on your nipples after breastfeeding. Lanolin helps to maintain your skin's normal moisture barrier. If you use pure lanolin, you do not need to wash it off before feeding your baby again. Pure lanolin is not toxic to your baby. You may also hand express a few drops of breast milk and gently massage that milk into your nipples and allow the milk to air dry. In the first few weeks after giving birth, some women experience extremely full breasts (engorgement). Engorgement can make your breasts feel heavy, warm, and tender to the touch. Engorgement peaks within 3-5 days after you give birth. The following recommendations can help ease engorgement:  Completely empty your breasts while breastfeeding or pumping. You may want to start by applying warm, moist heat (in the shower or with warm water-soaked hand towels) just before feeding or pumping. This increases circulation and helps the milk   flow. If your baby does not completely empty your breasts while breastfeeding, pump any extra milk after he or she is finished.  Wear a snug bra (nursing or regular) or tank top for 1-2 days to signal your body  to slightly decrease milk production.  Apply ice packs to your breasts, unless this is too uncomfortable for you.  Make sure that your baby is latched on and positioned properly while breastfeeding. If engorgement persists after 48 hours of following these recommendations, contact your health care provider or a lactation consultant. OVERALL HEALTH CARE RECOMMENDATIONS WHILE BREASTFEEDING  Eat healthy foods. Alternate between meals and snacks, eating 3 of each per day. Because what you eat affects your breast milk, some of the foods may make your baby more irritable than usual. Avoid eating these foods if you are sure that they are negatively affecting your baby.  Drink milk, fruit juice, and water to satisfy your thirst (about 10 glasses a day).  Rest often, relax, and continue to take your prenatal vitamins to prevent fatigue, stress, and anemia.  Continue breast self-awareness checks.  Avoid chewing and smoking tobacco. Chemicals from cigarettes that pass into breast milk and exposure to secondhand smoke may harm your baby.  Avoid alcohol and drug use, including marijuana. Some medicines that may be harmful to your baby can pass through breast milk. It is important to ask your health care provider before taking any medicine, including all over-the-counter and prescription medicine as well as vitamin and herbal supplements. It is possible to become pregnant while breastfeeding. If birth control is desired, ask your health care provider about options that will be safe for your baby. SEEK MEDICAL CARE IF:  You feel like you want to stop breastfeeding or have become frustrated with breastfeeding.  You have painful breasts or nipples.  Your nipples are cracked or bleeding.  Your breasts are red, tender, or warm.  You have a swollen area on either breast.  You have a fever or chills.  You have nausea or vomiting.  You have drainage other than breast milk from your nipples.  Your  breasts do not become full before feedings by the fifth day after you give birth.  You feel sad and depressed.  Your baby is too sleepy to eat well.  Your baby is having trouble sleeping.   Your baby is wetting less than 3 diapers in a 24-hour period.  Your baby has less than 3 stools in a 24-hour period.  Your baby's skin or the white part of his or her eyes becomes yellow.   Your baby is not gaining weight by 5 days of age. SEEK IMMEDIATE MEDICAL CARE IF:  Your baby is overly tired (lethargic) and does not want to wake up and feed.  Your baby develops an unexplained fever.   This information is not intended to replace advice given to you by your health care provider. Make sure you discuss any questions you have with your health care provider.   Document Released: 09/25/2005 Document Revised: 06/16/2015 Document Reviewed: 03/19/2013 Elsevier Interactive Patient Education 2016 [employer]  

## 2015-07-29 NOTE — Addendum Note (Signed)
Addended by: Tandy GawHINTON, Viktorya Arguijo C on: 07/29/2015 10:48 AM   Modules accepted: Orders

## 2015-07-29 NOTE — Progress Notes (Signed)
Subjective:  Bess HarvestBreanna A Froman is a 24 y.o. G2P1001 at 3049w0d being seen today for ongoing prenatal care.  Patient reports nausea and vomiting.  Contractions: Not present.  Vag. Bleeding: None. Movement: Present. Denies leaking of fluid.   The following portions of the patient's history were reviewed and updated as appropriate: allergies, current medications, past family history, past medical history, past social history, past surgical history and problem list. Problem list updated.  Objective:   Filed Vitals:   07/29/15 0859  BP: 104/69  Pulse: 69  Weight: 129 lb (58.514 kg)    Fetal Status: Fetal Heart Rate (bpm): 165   Movement: Present     General:  Alert, oriented and cooperative. Patient is in no acute distress.  Skin: Skin is warm and dry. No rash noted.   Cardiovascular: Normal heart rate noted  Respiratory: Normal respiratory effort, no problems with respiration noted  Abdomen: Soft, gravid, appropriate for gestational age. Pain/Pressure: Absent     Pelvic: Vag. Bleeding: None Vag D/C Character: Thin   Cervical exam deferred        Extremities: Normal range of motion.  Edema: None  Mental Status: Normal mood and affect. Normal behavior. Normal judgment and thought content.   Urinalysis: Urine Protein: Negative Urine Glucose: Negative  Assessment and Plan:  Pregnancy: G2P1001 at 5049w0d  1. Encounter for supervision of other normal pregnancy in first trimester Continue routine prenatal care. Dating is off by almost 3 wks by MSD and not a CRL--need to change dating after first screen.  Please refer to After Visit Summary for other counseling recommendations.  No Follow-up on file.   Reva Boresanya S Avani Sensabaugh, MD

## 2015-08-04 ENCOUNTER — Encounter (HOSPITAL_COMMUNITY): Payer: Self-pay

## 2015-08-04 ENCOUNTER — Other Ambulatory Visit: Payer: Self-pay | Admitting: Certified Nurse Midwife

## 2015-08-04 ENCOUNTER — Ambulatory Visit (HOSPITAL_COMMUNITY)
Admission: RE | Admit: 2015-08-04 | Discharge: 2015-08-04 | Disposition: A | Discharge status: Home / Self Care | Payer: Medicaid Other | Source: Ambulatory Visit | Attending: Certified Nurse Midwife | Admitting: Certified Nurse Midwife

## 2015-08-04 VITALS — BP 110/63 | HR 57 | Wt 129.4 lb

## 2015-08-04 DIAGNOSIS — Z3687 Encounter for antenatal screening for uncertain dates: Secondary | ICD-10-CM

## 2015-08-04 DIAGNOSIS — Z36 Encounter for antenatal screening of mother: Secondary | ICD-10-CM | POA: Insufficient documentation

## 2015-08-04 DIAGNOSIS — Z3A09 9 weeks gestation of pregnancy: Secondary | ICD-10-CM | POA: Diagnosis not present

## 2015-08-04 DIAGNOSIS — Z3682 Encounter for antenatal screening for nuchal translucency: Secondary | ICD-10-CM

## 2015-08-04 DIAGNOSIS — Z3491 Encounter for supervision of normal pregnancy, unspecified, first trimester: Secondary | ICD-10-CM

## 2015-08-09 ENCOUNTER — Emergency Department
Admission: EM | Admit: 2015-08-09 | Discharge: 2015-08-09 | Disposition: A | Discharge status: Home / Self Care | Payer: Medicaid Other | Attending: Emergency Medicine | Admitting: Emergency Medicine

## 2015-08-09 ENCOUNTER — Encounter: Payer: Self-pay | Admitting: Medical Oncology

## 2015-08-09 ENCOUNTER — Emergency Department: Payer: Medicaid Other

## 2015-08-09 DIAGNOSIS — O200 Threatened abortion: Secondary | ICD-10-CM | POA: Diagnosis not present

## 2015-08-09 DIAGNOSIS — Z3A10 10 weeks gestation of pregnancy: Secondary | ICD-10-CM | POA: Diagnosis not present

## 2015-08-09 DIAGNOSIS — Z87891 Personal history of nicotine dependence: Secondary | ICD-10-CM | POA: Diagnosis not present

## 2015-08-09 DIAGNOSIS — Z880 Allergy status to penicillin: Secondary | ICD-10-CM | POA: Insufficient documentation

## 2015-08-09 DIAGNOSIS — O209 Hemorrhage in early pregnancy, unspecified: Secondary | ICD-10-CM | POA: Diagnosis present

## 2015-08-09 LAB — CBC WITH DIFFERENTIAL/PLATELET
BASOS ABS: 0 10*3/uL (ref 0–0.1)
BASOS PCT: 0 %
Eosinophils Absolute: 0.2 10*3/uL (ref 0–0.7)
Eosinophils Relative: 2 %
HEMATOCRIT: 34.7 % — AB (ref 35.0–47.0)
Hemoglobin: 12.1 g/dL (ref 12.0–16.0)
LYMPHS PCT: 21 %
Lymphs Abs: 1.9 10*3/uL (ref 1.0–3.6)
MCH: 32.2 pg (ref 26.0–34.0)
MCHC: 34.8 g/dL (ref 32.0–36.0)
MCV: 92.7 fL (ref 80.0–100.0)
MONO ABS: 0.5 10*3/uL (ref 0.2–0.9)
Monocytes Relative: 5 %
NEUTROS ABS: 6.6 10*3/uL — AB (ref 1.4–6.5)
NEUTROS PCT: 72 %
Platelets: 201 10*3/uL (ref 150–440)
RBC: 3.75 MIL/uL — AB (ref 3.80–5.20)
RDW: 12.4 % (ref 11.5–14.5)
WBC: 9.3 10*3/uL (ref 3.6–11.0)

## 2015-08-09 LAB — TYPE AND SCREEN
ABO/RH(D): B POS
ANTIBODY SCREEN: NEGATIVE

## 2015-08-09 LAB — URINALYSIS COMPLETE WITH MICROSCOPIC (ARMC ONLY)
BILIRUBIN URINE: NEGATIVE
GLUCOSE, UA: NEGATIVE mg/dL
KETONES UR: NEGATIVE mg/dL
Nitrite: NEGATIVE
Protein, ur: 30 mg/dL — AB
Specific Gravity, Urine: 1.021 (ref 1.005–1.030)
pH: 6 (ref 5.0–8.0)

## 2015-08-09 LAB — HCG, QUANTITATIVE, PREGNANCY: HCG, BETA CHAIN, QUANT, S: 36581 m[IU]/mL — AB (ref <5)

## 2015-08-09 NOTE — ED Notes (Signed)
Pt reports that she is 12.[redacted] weeks pregnant and this afternoon she began having some vaginal bleeding. Denies pain.
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
## 2015-08-09 NOTE — ED Notes (Signed)
Patient transported to Ultrasound 

## 2015-08-09 NOTE — Discharge Instructions (Signed)

## 2015-08-09 NOTE — ED Notes (Signed)
States has had ultrasound for this pregnancy, single fetus.

## 2015-08-10 LAB — ABO/RH: ABO/RH(D): B POS

## 2015-08-24 ENCOUNTER — Other Ambulatory Visit (INDEPENDENT_AMBULATORY_CARE_PROVIDER_SITE_OTHER): Payer: Medicaid Other | Admitting: *Deleted

## 2015-08-24 ENCOUNTER — Encounter (HOSPITAL_COMMUNITY): Payer: Self-pay

## 2015-08-24 ENCOUNTER — Ambulatory Visit (HOSPITAL_COMMUNITY)
Admission: RE | Admit: 2015-08-24 | Discharge: 2015-08-24 | Disposition: A | Discharge status: Home / Self Care | Payer: Medicaid Other | Source: Ambulatory Visit | Attending: Certified Nurse Midwife | Admitting: Certified Nurse Midwife

## 2015-08-24 DIAGNOSIS — R319 Hematuria, unspecified: Secondary | ICD-10-CM | POA: Diagnosis not present

## 2015-08-24 DIAGNOSIS — Z36 Encounter for antenatal screening of mother: Secondary | ICD-10-CM | POA: Insufficient documentation

## 2015-08-24 DIAGNOSIS — R300 Dysuria: Secondary | ICD-10-CM

## 2015-08-24 DIAGNOSIS — Z3A12 12 weeks gestation of pregnancy: Secondary | ICD-10-CM | POA: Diagnosis not present

## 2015-08-24 DIAGNOSIS — Z3682 Encounter for antenatal screening for nuchal translucency: Secondary | ICD-10-CM

## 2015-08-24 LAB — POCT URINALYSIS DIPSTICK
Bilirubin, UA: NEGATIVE
GLUCOSE UA: NEGATIVE
KETONES UA: NEGATIVE
Nitrite, UA: NEGATIVE
PROTEIN UA: NEGATIVE
Spec Grav, UA: 1.01
Urobilinogen, UA: 0.2
pH, UA: 7

## 2015-08-24 MED ORDER — CEPHALEXIN 500 MG PO CAPS: 500.0000 mg | ORAL_CAPSULE | Freq: Four times a day (QID) | ORAL | Status: DC

## 2015-08-24 NOTE — Progress Notes (Signed)
Pt came in office c/o pain with urination, UA + for blood and leukocytes.  Pt has allergy to PCN, currently in 1st trimester so would need to send in Keflex, pt will wait for urine cx to come back for antibiotic.  Instructed pt to drink plenty of fluids and will contact her when the urine cx results come back.

## 2015-08-25 ENCOUNTER — Telehealth: Payer: Self-pay | Admitting: *Deleted

## 2015-08-25 NOTE — Telephone Encounter (Signed)
Pt seen in office yesterday for Dysuria, pt has allergy to PCN but has taken Keflex in the past with no reaction.  Still awaiting urine cx, sent rx for Keflex to pharmacy. Instructed pt on medication use.

## 2015-08-27 LAB — CULTURE, OB URINE: Colony Count: 60000

## 2015-09-01 ENCOUNTER — Other Ambulatory Visit (HOSPITAL_COMMUNITY): Payer: Self-pay

## 2015-09-13 ENCOUNTER — Ambulatory Visit (HOSPITAL_COMMUNITY): Payer: Medicaid Other

## 2015-09-23 ENCOUNTER — Ambulatory Visit (INDEPENDENT_AMBULATORY_CARE_PROVIDER_SITE_OTHER): Payer: Medicaid Other | Admitting: Obstetrics and Gynecology

## 2015-09-23 ENCOUNTER — Encounter: Payer: Self-pay | Admitting: Obstetrics and Gynecology

## 2015-09-23 VITALS — BP 126/75 | HR 76 | Wt 134.0 lb

## 2015-09-23 DIAGNOSIS — Z3482 Encounter for supervision of other normal pregnancy, second trimester: Secondary | ICD-10-CM

## 2015-09-23 DIAGNOSIS — O99332 Smoking (tobacco) complicating pregnancy, second trimester: Secondary | ICD-10-CM

## 2015-09-23 MED ORDER — NICOTINE 14 MG/24HR TD PT24: 14.0000 mg | MEDICATED_PATCH | Freq: Every day | TRANSDERMAL | Status: DC

## 2015-09-23 NOTE — Progress Notes (Signed)
Subjective:  Sharon Chandler is a 24 y.o. G2P1001 at 6841w6d being seen today for ongoing prenatal care.  She is currently monitored for the following issues for this high-risk pregnancy and has Supervision of normal subsequent pregnancy and Pneumatocele of lung (HCC) on her problem list.  Patient reports starting smoking again and would like to quit. She is smoking 8-10 cigarettes per day.  Contractions: Not present. Vag. Bleeding: None.  Movement: Present. Denies leaking of fluid.   The following portions of the patient's history were reviewed and updated as appropriate: allergies, current medications, past family history, past medical history, past social history, past surgical history and problem list. Problem list updated.  Objective:   Filed Vitals:   09/23/15 0826  BP: 126/75  Pulse: 76  Weight: 134 lb (60.782 kg)    Fetal Status: Fetal Heart Rate (bpm): 148   Movement: Present     General:  Alert, oriented and cooperative. Patient is in no acute distress.  Skin: Skin is warm and dry. No rash noted.   Cardiovascular: Normal heart rate noted  Respiratory: Normal respiratory effort, no problems with respiration noted  Abdomen: Soft, gravid, appropriate for gestational age. Pain/Pressure: Absent     Pelvic: Vag. Bleeding: None Vag D/C Character: Thin   Cervical exam deferred        Extremities: Normal range of motion.  Edema: None  Mental Status: Normal mood and affect. Normal behavior. Normal judgment and thought content.   Urinalysis:      Assessment and Plan:  Pregnancy: G2P1001 at 3741w6d  1. Encounter for supervision of other normal pregnancy in second trimester Patient scheduled for anatomy ultrasound next week AFP next visit  2. Tobacco smoker - Nicotine patch Rx provided - Smoking cessation information also given   General obstetric precautions including but not limited to vaginal bleeding, contractions, leaking of fluid and fetal movement were reviewed in  detail with the patient. Please refer to After Visit Summary for other counseling recommendations.  Return in about 4 weeks (around 10/21/2015).   Catalina AntiguaPeggy Wilmer Berryhill, MD

## 2015-09-23 NOTE — Patient Instructions (Signed)
Steps to Quit Smoking  Smoking tobacco can be harmful to your health and can affect almost every organ in your body. Smoking puts you, and those around you, at risk for developing many serious chronic diseases. Quitting smoking is difficult, but it is one of the best things that you can do for your health. It is never too late to quit. WHAT ARE THE BENEFITS OF QUITTING SMOKING? When you quit smoking, you lower your risk of developing serious diseases and conditions, such as:  Lung cancer or lung disease, such as COPD.  Heart disease.  Stroke.  Heart attack.  Infertility.  Osteoporosis and bone fractures. Additionally, symptoms such as coughing, wheezing, and shortness of breath may get better when you quit. You may also find that you get sick less often because your body is stronger at fighting off colds and infections. If you are pregnant, quitting smoking can help to reduce your chances of having a baby of low birth weight. HOW DO I GET READY TO QUIT? When you decide to quit smoking, create a plan to make sure that you are successful. Before you quit:  Pick a date to quit. Set a date within the next two weeks to give you time to prepare.  Write down the reasons why you are quitting. Keep this list in places where you will see it often, such as on your bathroom mirror or in your car or wallet.  Identify the people, places, things, and activities that make you want to smoke (triggers) and avoid them. Make sure to take these actions:  Throw away all cigarettes at home, at work, and in your car.  Throw away smoking accessories, such as ashtrays and lighters.  Clean your car and make sure to empty the ashtray.  Clean your home, including curtains and carpets.  Tell your family, friends, and coworkers that you are quitting. Support from your loved ones can make quitting easier.  Talk with your health care provider about your options for quitting smoking.  Find out what treatment  options are covered by your health insurance. WHAT STRATEGIES CAN I USE TO QUIT SMOKING?  Talk with your healthcare provider about different strategies to quit smoking. Some strategies include:  Quitting smoking altogether instead of gradually lessening how much you smoke over a period of time. Research shows that quitting "cold turkey" is more successful than gradually quitting.  Attending in-person counseling to help you build problem-solving skills. You are more likely to have success in quitting if you attend several counseling sessions. Even short sessions of 10 minutes can be effective.  Finding resources and support systems that can help you to quit smoking and remain smoke-free after you quit. These resources are most helpful when you use them often. They can include:  Online chats with a counselor.  Telephone quitlines.  Printed self-help materials.  Support groups or group counseling.  Text messaging programs.  Mobile phone applications.  Taking medicines to help you quit smoking. (If you are pregnant or breastfeeding, talk with your health care provider first.) Some medicines contain nicotine and some do not. Both types of medicines help with cravings, but the medicines that include nicotine help to relieve withdrawal symptoms. Your health care provider may recommend:  Nicotine patches, gum, or lozenges.  Nicotine inhalers or sprays.  Non-nicotine medicine that is taken by mouth. Talk with your health care provider about combining strategies, such as taking medicines while you are also receiving in-person counseling. Using these two strategies together makes   you more likely to succeed in quitting than if you used either strategy on its own. If you are pregnant or breastfeeding, talk with your health care provider about finding counseling or other support strategies to quit smoking. Do not take medicine to help you quit smoking unless told to do so by your health care  provider. WHAT THINGS CAN I DO TO MAKE IT EASIER TO QUIT? Quitting smoking might feel overwhelming at first, but there is a lot that you can do to make it easier. Take these important actions:  Reach out to your family and friends and ask that they support and encourage you during this time. Call telephone quitlines, reach out to support groups, or work with a counselor for support.  Ask people who smoke to avoid smoking around you.  Avoid places that trigger you to smoke, such as bars, parties, or smoke-break areas at work.  Spend time around people who do not smoke.  Lessen stress in your life, because stress can be a smoking trigger for some people. To lessen stress, try:  Exercising regularly.  Deep-breathing exercises.  Yoga.  Meditating.  Performing a body scan. This involves closing your eyes, scanning your body from head to toe, and noticing which parts of your body are particularly tense. Purposefully relax the muscles in those areas.  Download or purchase mobile phone or tablet apps (applications) that can help you stick to your quit plan by providing reminders, tips, and encouragement. There are many free apps, such as QuitGuide from the [employer] developer(Centers for Disease Control and Prevention). You can find other support for quitting smoking (smoking cessation) through smokefree.gov and other websites. HOW WILL I FEEL WHEN I QUIT SMOKING? Within the first 24 hours of quitting smoking, you may start to feel some withdrawal symptoms. These symptoms are usually most noticeable 2-3 days after quitting, but they usually do not last beyond 2-3 weeks. Changes or symptoms that you might experience include:  Mood swings.  Restlessness, anxiety, or irritation.  Difficulty concentrating.  Dizziness.  Strong cravings for sugary foods in addition to nicotine.  Mild weight gain.  Constipation.  Nausea.  Coughing or a sore throat.  Changes in how your medicines work in your  body.  A depressed mood.  Difficulty sleeping (insomnia). After the first 2-3 weeks of quitting, you may start to notice more positive results, such as:  Improved sense of smell and taste.  Decreased coughing and sore throat.  Slower heart rate.  Lower blood pressure.  Clearer skin.  The ability to breathe more easily.  Fewer sick days. Quitting smoking is very challenging for most people. Do not get discouraged if you are not successful the first time. Some people need to make many attempts to quit before they achieve long-term success. Do your best to stick to your quit plan, and talk with your health care provider if you have any questions or concerns.   This information is not intended to replace advice given to you by your health care provider. Make sure you discuss any questions you have with your health care provider.   Document Released: 09/19/2001 Document Revised: 02/09/2015 Document Reviewed: 02/09/2015 Elsevier Interactive Patient Education 2016 [employer]  Contraception Choices Contraception (birth control) is the use of any methods or devices to prevent pregnancy. Below are some methods to help avoid pregnancy. HORMONAL METHODS   Contraceptive implant. This is a thin, plastic tube containing progesterone hormone. It does not contain estrogen hormone. Your health care provider inserts  the tube in the inner part of the upper arm. The tube can remain in place for up to 3 years. After 3 years, the implant must be removed. The implant prevents the ovaries from releasing an egg (ovulation), thickens the cervical mucus to prevent sperm from entering the uterus, and thins the lining of the inside of the uterus.  Progesterone-only injections. These injections are given every 3 months by your health care provider to prevent pregnancy. This synthetic progesterone hormone stops the ovaries from releasing eggs. It also thickens cervical mucus and changes the uterine lining. This  makes it harder for sperm to survive in the uterus.  Birth control pills. These pills contain estrogen and progesterone hormone. They work by preventing the ovaries from releasing eggs (ovulation). They also cause the cervical mucus to thicken, preventing the sperm from entering the uterus. Birth control pills are prescribed by a health care provider.Birth control pills can also be used to treat heavy periods.  Minipill. This type of birth control pill contains only the progesterone hormone. They are taken every day of each month and must be prescribed by your health care provider.  Birth control patch. The patch contains hormones similar to those in birth control pills. It must be changed once a week and is prescribed by a health care provider.  Vaginal ring. The ring contains hormones similar to those in birth control pills. It is left in the vagina for 3 weeks, removed for 1 week, and then a new one is put back in place. The patient must be comfortable inserting and removing the ring from the vagina.A health care provider's prescription is necessary.  Emergency contraception. Emergency contraceptives prevent pregnancy after unprotected sexual intercourse. This pill can be taken right after sex or up to 5 days after unprotected sex. It is most effective the sooner you take the pills after having sexual intercourse. Most emergency contraceptive pills are available without a prescription. Check with your pharmacist. Do not use emergency contraception as your only form of birth control. BARRIER METHODS   Female condom. This is a thin sheath (latex or rubber) that is worn over the penis during sexual intercourse. It can be used with spermicide to increase effectiveness.  Female condom. This is a soft, loose-fitting sheath that is put into the vagina before sexual intercourse.  Diaphragm. This is a soft, latex, dome-shaped barrier that must be fitted by a health care provider. It is inserted into the  vagina, along with a spermicidal jelly. It is inserted before intercourse. The diaphragm should be left in the vagina for 6 to 8 hours after intercourse.  Cervical cap. This is a round, soft, latex or plastic cup that fits over the cervix and must be fitted by a health care provider. The cap can be left in place for up to 48 hours after intercourse.  Sponge. This is a soft, circular piece of polyurethane foam. The sponge has spermicide in it. It is inserted into the vagina after wetting it and before sexual intercourse.  Spermicides. These are chemicals that kill or block sperm from entering the cervix and uterus. They come in the form of creams, jellies, suppositories, foam, or tablets. They do not require a prescription. They are inserted into the vagina with an applicator before having sexual intercourse. The process must be repeated every time you have sexual intercourse. INTRAUTERINE CONTRACEPTION  Intrauterine device (IUD). This is a T-shaped device that is put in a woman's uterus during a menstrual period to prevent  pregnancy. There are 2 types:  Copper IUD. This type of IUD is wrapped in copper wire and is placed inside the uterus. Copper makes the uterus and fallopian tubes produce a fluid that kills sperm. It can stay in place for 10 years.  Hormone IUD. This type of IUD contains the hormone progestin (synthetic progesterone). The hormone thickens the cervical mucus and prevents sperm from entering the uterus, and it also thins the uterine lining to prevent implantation of a fertilized egg. The hormone can weaken or kill the sperm that get into the uterus. It can stay in place for 3-5 years, depending on which type of IUD is used. PERMANENT METHODS OF CONTRACEPTION  Female tubal ligation. This is when the woman's fallopian tubes are surgically sealed, tied, or blocked to prevent the egg from traveling to the uterus.  Hysteroscopic sterilization. This involves placing a small coil or  insert into each fallopian tube. Your doctor uses a technique called hysteroscopy to do the procedure. The device causes scar tissue to form. This results in permanent blockage of the fallopian tubes, so the sperm cannot fertilize the egg. It takes about 3 months after the procedure for the tubes to become blocked. You must use another form of birth control for these 3 months.  Female sterilization. This is when the female has the tubes that carry sperm tied off (vasectomy).This blocks sperm from entering the vagina during sexual intercourse. After the procedure, the man can still ejaculate fluid (semen). NATURAL PLANNING METHODS  Natural family planning. This is not having sexual intercourse or using a barrier method (condom, diaphragm, cervical cap) on days the woman could become pregnant.  Calendar method. This is keeping track of the length of each menstrual cycle and identifying when you are fertile.  Ovulation method. This is avoiding sexual intercourse during ovulation.  Symptothermal method. This is avoiding sexual intercourse during ovulation, using a thermometer and ovulation symptoms.  Post-ovulation method. This is timing sexual intercourse after you have ovulated. Regardless of which type or method of contraception you choose, it is important that you use condoms to protect against the transmission of sexually transmitted infections (STIs). Talk with your health care provider about which form of contraception is most appropriate for you.   This information is not intended to replace advice given to you by your health care provider. Make sure you discuss any questions you have with your health care provider.   Document Released: 09/25/2005 Document Revised: 09/30/2013 Document Reviewed: 03/20/2013 Elsevier Interactive Patient Education [employer]

## 2015-10-01 ENCOUNTER — Ambulatory Visit (HOSPITAL_COMMUNITY)
Admission: RE | Admit: 2015-10-01 | Discharge: 2015-10-01 | Disposition: A | Discharge status: Home / Self Care | Payer: Medicaid Other | Source: Ambulatory Visit | Attending: Family Medicine | Admitting: Family Medicine

## 2015-10-01 ENCOUNTER — Other Ambulatory Visit: Payer: Self-pay | Admitting: Family Medicine

## 2015-10-01 DIAGNOSIS — Z3689 Encounter for other specified antenatal screening: Secondary | ICD-10-CM

## 2015-10-01 DIAGNOSIS — Z3A18 18 weeks gestation of pregnancy: Secondary | ICD-10-CM

## 2015-10-01 DIAGNOSIS — Z3482 Encounter for supervision of other normal pregnancy, second trimester: Secondary | ICD-10-CM

## 2015-10-01 DIAGNOSIS — Z36 Encounter for antenatal screening of mother: Secondary | ICD-10-CM | POA: Diagnosis not present

## 2015-10-01 DIAGNOSIS — O99332 Smoking (tobacco) complicating pregnancy, second trimester: Secondary | ICD-10-CM | POA: Diagnosis not present

## 2015-10-10 NOTE — L&D Delivery Note (Signed)
Delivery Note At 11:32 PM a viable female was delivered via Vaginal, Spontaneous Delivery (Presentation: ;Right Occiput Anterior) en caul- membranes ruptured and pulled from baby immediately after birth, clear fluid.  APGAR: 9, 9; weight: pending at time of note  . Infant placed directly on mom's abdomen for bonding/skin-to-skin. Delayed cord clamping, then cord clamped x 2, and cut by fob.     Placenta status: Intact, Spontaneous.  Cord: 3 vessels with the following complications: .  Cord pH: not done  Anesthesia: None  Episiotomy: None Lacerations: None Suture Repair: n/a Est. Blood Loss (mL): 100  Mom to postpartum.  Baby to Couplet care / Skin to Skin. Plans to breastfeed, IUD for contraception, outpatient circ Did not get antbx for GBS prophylaxis d/t rapid labor  Marge DuncansBooker, Kimberly Randall 02/19/2016, 11:59 PM

## 2015-10-15 ENCOUNTER — Encounter: Payer: Medicaid Other | Admitting: Obstetrics & Gynecology

## 2015-10-27 ENCOUNTER — Ambulatory Visit (INDEPENDENT_AMBULATORY_CARE_PROVIDER_SITE_OTHER): Payer: Medicaid Other | Admitting: Certified Nurse Midwife

## 2015-10-27 VITALS — BP 125/72 | HR 89 | Wt 142.0 lb

## 2015-10-27 DIAGNOSIS — Z3482 Encounter for supervision of other normal pregnancy, second trimester: Secondary | ICD-10-CM

## 2015-10-27 NOTE — Patient Instructions (Signed)

## 2015-10-27 NOTE — Progress Notes (Signed)
Subjective:  Sharon Chandler is a 25 y.o. G2P1001 at [redacted]w[redacted]d being seen today for ongoing prenatal care.  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  Patient reports rash on chest.  Contractions: Not present. Vag. Bleeding: None.  Movement: Present. Denies leaking of fluid.   The following portions of the patient's history were reviewed and updated as appropriate: allergies, current medications, past family history, past medical history, past social history, past surgical history and problem list. Problem list updated.  Objective:   Filed Vitals:   10/27/15 1318  BP: 125/72  Pulse: 89  Weight: 142 lb (64.411 kg)    Fetal Status: Fetal Heart Rate (bpm): 140   Movement: Present     General:  Alert, oriented and cooperative. Patient is in no acute distress.  Skin: Skin is warm and dry. No rash noted.   Cardiovascular: Normal heart rate noted  Respiratory: Normal respiratory effort, no problems with respiration noted  Abdomen: Soft, gravid, appropriate for gestational age. Pain/Pressure: Absent     Pelvic: Vag. Bleeding: None Vag D/C Character: Thin   Cervical exam deferred        Extremities: Normal range of motion.  Edema: None  Mental Status: Normal mood and affect. Normal behavior. Normal judgment and thought content.   Urinalysis: Urine Protein: Negative Urine Glucose: Negative  Assessment and Plan:  Pregnancy: G2P1001 at [redacted]w[redacted]d  There are no diagnoses linked to this encounter. Preterm labor symptoms and general obstetric precautions including but not limited to vaginal bleeding, contractions, leaking of fluid and fetal movement were reviewed in detail with the patient. Please refer to After Visit Summary for other counseling recommendations.  Return in about 4 weeks (around 11/24/2015).  Use hydrocortisone cream for rash Rhea Pink,  CNM

## 2015-11-18 ENCOUNTER — Encounter: Payer: Medicaid Other | Admitting: Family Medicine

## 2015-11-23 ENCOUNTER — Encounter: Payer: Self-pay | Admitting: Obstetrics and Gynecology

## 2015-11-23 ENCOUNTER — Ambulatory Visit (INDEPENDENT_AMBULATORY_CARE_PROVIDER_SITE_OTHER): Payer: Medicaid Other | Admitting: Obstetrics and Gynecology

## 2015-11-23 VITALS — BP 116/79 | HR 76 | Wt 147.0 lb

## 2015-11-23 DIAGNOSIS — Z3482 Encounter for supervision of other normal pregnancy, second trimester: Secondary | ICD-10-CM

## 2015-11-23 MED ORDER — FLUCONAZOLE 150 MG PO TABS: 150.0000 mg | ORAL_TABLET | Freq: Once | ORAL | Status: DC

## 2015-11-23 NOTE — Progress Notes (Signed)
Subjective:  Sharon Chandler is a 25 y.o. G2P1001 at [redacted]w[redacted]d being seen today for ongoing prenatal care.  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  Patient reports no complaints.  Contractions: Not present. Vag. Bleeding: None.  Movement: Present. Denies leaking of fluid.   The following portions of the patient's history were reviewed and updated as appropriate: allergies, current medications, past family history, past medical history, past social history, past surgical history and problem list. Problem list updated.  Objective:   Filed Vitals:   11/23/15 1309  BP: 116/79  Pulse: 76  Weight: 147 lb (66.679 kg)    Fetal Status: Fetal Heart Rate (bpm): 136 Fundal Height: 25 cm Movement: Present     General:  Alert, oriented and cooperative. Patient is in no acute distress.  Skin: Skin is warm and dry. No rash noted.   Cardiovascular: Normal heart rate noted  Respiratory: Normal respiratory effort, no problems with respiration noted  Abdomen: Soft, gravid, appropriate for gestational age. Pain/Pressure: Absent     Pelvic: Vag. Bleeding: None Vag D/C Character: Thin   Cervical exam deferred        Extremities: Normal range of motion.  Edema: None  Mental Status: Normal mood and affect. Normal behavior. Normal judgment and thought content.   Urinalysis: Urine Protein: Negative Urine Glucose: Negative  Assessment and Plan:  Pregnancy: G2P1001 at [redacted]w[redacted]d  1. Encounter for supervision of other normal pregnancy in second trimester Patient is doing well. She remains undecided on contraception Follow up anatomy ultrasound ordered - US MFM OB FOLLOW UP; Future  Preterm labor symptoms and general obstetric precautions including but not limited to vaginal bleeding, contractions, leaking of fluid and fetal movement were reviewed in detail with the  patient. Please refer to After Visit Summary for other counseling recommendations.  Return in about 3 weeks (around 12/14/2015).   Catalina Antigua, MD

## 2015-11-26 ENCOUNTER — Telehealth: Payer: Self-pay | Admitting: *Deleted

## 2015-11-26 DIAGNOSIS — B379 Candidiasis, unspecified: Secondary | ICD-10-CM

## 2015-11-26 MED ORDER — FLUCONAZOLE 150 MG PO TABS: 150.0000 mg | ORAL_TABLET | Freq: Once | ORAL | Status: DC

## 2015-11-26 NOTE — Telephone Encounter (Signed)
Pt c/o yeast infection, was given Diflucan a few days ago and is still having symptoms, sent in one more tablet for treatment.

## 2015-11-26 NOTE — Telephone Encounter (Signed)
-----   Message from Olevia Bowens sent at 11/26/2015  8:40 AM EST ----- Regarding: Rx Request Contact: [phone] Wants something called in for yeast

## 2015-11-30 ENCOUNTER — Telehealth: Payer: Self-pay | Admitting: *Deleted

## 2015-11-30 ENCOUNTER — Ambulatory Visit (HOSPITAL_COMMUNITY)
Admission: RE | Admit: 2015-11-30 | Discharge: 2015-11-30 | Disposition: A | Discharge status: Home / Self Care | Payer: Medicaid Other | Source: Ambulatory Visit | Attending: Obstetrics and Gynecology | Admitting: Obstetrics and Gynecology

## 2015-11-30 DIAGNOSIS — Z3482 Encounter for supervision of other normal pregnancy, second trimester: Secondary | ICD-10-CM

## 2015-11-30 DIAGNOSIS — Z3A26 26 weeks gestation of pregnancy: Secondary | ICD-10-CM | POA: Insufficient documentation

## 2015-11-30 DIAGNOSIS — O99332 Smoking (tobacco) complicating pregnancy, second trimester: Secondary | ICD-10-CM | POA: Diagnosis not present

## 2015-11-30 DIAGNOSIS — Z36 Encounter for antenatal screening of mother: Secondary | ICD-10-CM | POA: Diagnosis not present

## 2015-11-30 NOTE — Telephone Encounter (Signed)
Informed pt of normal anatomy US, no further questions.

## 2015-11-30 NOTE — Telephone Encounter (Signed)
-----   Message from Olevia Bowens sent at 11/30/2015  2:30 PM EST ----- Regarding: Ultrasound Results Wants to talk to someone about her ultrasound, none of the phone numbers she gave matched any of the numbers in her chart. She states she can be reached at [phone] or [phone]

## 2015-11-30 NOTE — Telephone Encounter (Signed)
Called pt, no answer, left message to call the office.  

## 2015-11-30 NOTE — Telephone Encounter (Signed)
-----   Message from Jacinda S Battle sent at 11/30/2015  2:30 PM EST ----- Regarding: Ultrasound Results Wants to talk to someone about her ultrasound, none of the phone numbers she gave matched any of the numbers in her chart. She states she can be reached at [phone] or [phone]  

## 2015-12-07 ENCOUNTER — Encounter: Payer: Medicaid Other | Admitting: Obstetrics & Gynecology

## 2015-12-14 ENCOUNTER — Encounter: Payer: Medicaid Other | Admitting: Obstetrics & Gynecology

## 2015-12-20 ENCOUNTER — Ambulatory Visit (INDEPENDENT_AMBULATORY_CARE_PROVIDER_SITE_OTHER): Payer: Medicaid Other | Admitting: Family Medicine

## 2015-12-20 ENCOUNTER — Encounter: Payer: Self-pay | Admitting: Family Medicine

## 2015-12-20 VITALS — BP 119/76 | HR 108 | Wt 146.0 lb

## 2015-12-20 DIAGNOSIS — Z3482 Encounter for supervision of other normal pregnancy, second trimester: Secondary | ICD-10-CM

## 2015-12-20 DIAGNOSIS — Z36 Encounter for antenatal screening of mother: Secondary | ICD-10-CM

## 2015-12-20 LAB — CBC
HEMATOCRIT: 32 % — AB (ref 36.0–46.0)
HEMOGLOBIN: 10.7 g/dL — AB (ref 12.0–15.0)
MCH: 30.6 pg (ref 26.0–34.0)
MCHC: 33.4 g/dL (ref 30.0–36.0)
MCV: 91.4 fL (ref 78.0–100.0)
MPV: 10.1 fL (ref 8.6–12.4)
Platelets: 235 10*3/uL (ref 150–400)
RBC: 3.5 MIL/uL — ABNORMAL LOW (ref 3.87–5.11)
RDW: 13.1 % (ref 11.5–15.5)
WBC: 8.9 10*3/uL (ref 4.0–10.5)

## 2015-12-20 MED ORDER — TETANUS-DIPHTH-ACELL PERTUSSIS 5-2.5-18.5 LF-MCG/0.5 IM SUSP: 0.5000 mL | Freq: Once | INTRAMUSCULAR | Status: DC

## 2015-12-20 NOTE — Progress Notes (Signed)
Friday night she had cramping and thought she passed her mucous plug.  Doing her 1hrGTT today.

## 2015-12-20 NOTE — Progress Notes (Signed)
Subjective:  Sharon Chandler is a 25 y.o. G2P1001 at 6127w3d being seen today for ongoing prenatal care.  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  Patient reports no complaints. Had some mucousy vaginal discharge when walking. Contractions: Not present. Vag. Bleeding: None.  Movement: Present. Denies leaking of fluid.   The following portions of the patient's history were reviewed and updated as appropriate: allergies, current medications, past family history, past medical history, past social history, past surgical history and problem list. Problem list updated.  Objective:   Filed Vitals:   12/20/15 1352  BP: 119/76  Pulse: 108  Weight: 146 lb (66.225 kg)    Fetal Status: Fetal Heart Rate (bpm): 140 Fundal Height: 29 cm Movement: Present     General:  Alert, oriented and cooperative. Patient is in no acute distress.  Skin: Skin is warm and dry. No rash noted.   Cardiovascular: Normal heart rate noted  Respiratory: Normal respiratory effort, no problems with respiration noted  Abdomen: Soft, gravid, appropriate for gestational age. Pain/Pressure: Present     Pelvic: Vag. Bleeding: None     Cervical exam deferred        Extremities: Normal range of motion.  Edema: None  Mental Status: Normal mood and affect. Normal behavior. Normal judgment and thought content.   Urinalysis: Urine Protein: Negative Urine Glucose: Negative  Assessment and Plan:  Pregnancy: G2P1001 at 5727w3d  1. Encounter for supervision of other normal pregnancy in second trimester 28 wk labs and TDaP today - CBC - HIV antibody - RPR - Glucose Tolerance, 1 HR (50g) - Tdap (BOOSTRIX) injection 0.5 mL; Inject 0.5 mLs into the muscle once.  Preterm labor symptoms and general obstetric precautions including but not limited to vaginal bleeding, contractions, leaking of  fluid and fetal movement were reviewed in detail with the patient. Please refer to After Visit Summary for other counseling recommendations.  Return in 2 weeks (on 01/03/2016).   Reva Boresanya S Jackye Dever, MD

## 2015-12-20 NOTE — Patient Instructions (Addendum)
Having a circumcision done in the hospital costs approximately $480.  This will have to be paid in full prior to circumcision being performed.  There are places to have circumcision done as an outpatient which are cheaper.    Circumcisions      Provider   Phone    Price     ------------------------------------------------------------------------------   [facility]  [phone]  $480 by 4 wks     [facility]   [phone]  $[phone] by 4 wks [place]     [facility]   [phone]  $175 by 2 wks [place]    [facility]   [phone]  $250 by 7 days [facility]   [phone]  $150 by 4 wks  Third Trimester of Pregnancy The third trimester is from week 29 through week 42, months 7 through 9. The third trimester is a time when the fetus is growing rapidly. At the end of the ninth month, the fetus is about 20 inches in length and weighs 6-10 pounds.  BODY CHANGES Your body goes through many changes during pregnancy. The changes vary from woman to woman.   Your weight will continue to increase. You can expect to gain 25-35 pounds (11-16 kg) by the end of the pregnancy.  You may begin to get stretch marks on your hips, abdomen, and breasts.  You may urinate more often because the fetus is moving lower into your pelvis and pressing on your bladder.  You may develop or continue to have heartburn as a result of your pregnancy.  You may develop constipation because certain hormones are causing the muscles that push waste through your intestines to slow down.  You may develop hemorrhoids or swollen, bulging veins (varicose veins).  You may have pelvic pain because of the weight gain and pregnancy hormones relaxing your joints between the bones in your pelvis. Backaches may result from overexertion of the muscles supporting your posture.  You may have changes in your hair. These can include thickening of your hair, rapid growth, and changes in texture. Some women also have hair loss during or after pregnancy, or hair that  feels dry or thin. Your hair will most likely return to normal after your baby is born.  Your breasts will continue to grow and be tender. A yellow discharge may leak from your breasts called colostrum.  Your belly button may stick out.  You may feel short of breath because of your expanding uterus.  You may notice the fetus "dropping," or moving lower in your abdomen.  You may have a bloody mucus discharge. This usually occurs a few days to a week before labor begins.  Your cervix becomes thin and soft (effaced) near your due date. WHAT TO EXPECT AT YOUR PRENATAL EXAMS  You will have prenatal exams every 2 weeks until week 36. Then, you will have weekly prenatal exams. During a routine prenatal visit:  You will be weighed to make sure you and the fetus are growing normally.  Your blood pressure is taken.  Your abdomen will be measured to track your baby's growth.  The fetal heartbeat will be listened to.  Any test results from the previous visit will be discussed.  You may have a cervical check near your due date to see if you have effaced. At around 36 weeks, your caregiver will check your cervix. At the same time, your caregiver will also perform a test on the secretions of the vaginal tissue. This test is to determine if a type of  bacteria, Group B streptococcus, is present. Your caregiver will explain this further. Your caregiver may ask you:  What your birth plan is.  How you are feeling.  If you are feeling the baby move.  If you have had any abnormal symptoms, such as leaking fluid, bleeding, severe headaches, or abdominal cramping.  If you are using any tobacco products, including cigarettes, chewing tobacco, and electronic cigarettes.  If you have any questions. Other tests or screenings that may be performed during your third trimester include:  Blood tests that check for low iron levels (anemia).  Fetal testing to check the health, activity level, and growth  of the fetus. Testing is done if you have certain medical conditions or if there are problems during the pregnancy.  HIV (human immunodeficiency virus) testing. If you are at high risk, you may be screened for HIV during your third trimester of pregnancy. FALSE LABOR You may feel small, irregular contractions that eventually go away. These are called Braxton Hicks contractions, or false labor. Contractions may last for hours, days, or even weeks before true labor sets in. If contractions come at regular intervals, intensify, or become painful, it is best to be seen by your caregiver.  SIGNS OF LABOR   Menstrual-like cramps.  Contractions that are 5 minutes apart or less.  Contractions that start on the top of the uterus and spread down to the lower abdomen and back.  A sense of increased pelvic pressure or back pain.  A watery or bloody mucus discharge that comes from the vagina. If you have any of these signs before the 37th week of pregnancy, call your caregiver right away. You need to go to the hospital to get checked immediately. HOME CARE INSTRUCTIONS   Avoid all smoking, herbs, alcohol, and unprescribed drugs. These chemicals affect the formation and growth of the baby.  Do not use any tobacco products, including cigarettes, chewing tobacco, and electronic cigarettes. If you need help quitting, ask your health care provider. You may receive counseling support and other resources to help you quit.  Follow your caregiver's instructions regarding medicine use. There are medicines that are either safe or unsafe to take during pregnancy.  Exercise only as directed by your caregiver. Experiencing uterine cramps is a good sign to stop exercising.  Continue to eat regular, healthy meals.  Wear a good support bra for breast tenderness.  Do not use hot tubs, steam rooms, or saunas.  Wear your seat belt at all times when driving.  Avoid raw meat, uncooked cheese, cat litter boxes, and  soil used by cats. These carry germs that can cause birth defects in the baby.  Take your prenatal vitamins.  Take 1500-2000 mg of calcium daily starting at the 20th week of pregnancy until you deliver your baby.  Try taking a stool softener (if your caregiver approves) if you develop constipation. Eat more high-fiber foods, such as fresh vegetables or fruit and whole grains. Drink plenty of fluids to keep your urine clear or pale yellow.  Take warm sitz baths to soothe any pain or discomfort caused by hemorrhoids. Use hemorrhoid cream if your caregiver approves.  If you develop varicose veins, wear support hose. Elevate your feet for 15 minutes, 3-4 times a day. Limit salt in your diet.  Avoid heavy lifting, wear low heal shoes, and practice good posture.  Rest a lot with your legs elevated if you have leg cramps or low back pain.  Visit your dentist if you have not  gone during your pregnancy. Use a soft toothbrush to brush your teeth and be gentle when you floss.  A sexual relationship may be continued unless your caregiver directs you otherwise.  Do not travel far distances unless it is absolutely necessary and only with the approval of your caregiver.  Take prenatal classes to understand, practice, and ask questions about the labor and delivery.  Make a trial run to the hospital.  Pack your hospital bag.  Prepare the baby's nursery.  Continue to go to all your prenatal visits as directed by your caregiver. SEEK MEDICAL CARE IF:  You are unsure if you are in labor or if your water has broken.  You have dizziness.  You have mild pelvic cramps, pelvic pressure, or nagging pain in your abdominal area.  You have persistent nausea, vomiting, or diarrhea.  You have a bad smelling vaginal discharge.  You have pain with urination. SEEK IMMEDIATE MEDICAL CARE IF:   You have a fever.  You are leaking fluid from your vagina.  You have spotting or bleeding from your  vagina.  You have severe abdominal cramping or pain.  You have rapid weight loss or gain.  You have shortness of breath with chest pain.  You notice sudden or extreme swelling of your face, hands, ankles, feet, or legs.  You have not felt your baby move in over an hour.  You have severe headaches that do not go away with medicine.  You have vision changes.   This information is not intended to replace advice given to you by your health care provider. Make sure you discuss any questions you have with your health care provider.   Document Released: 09/19/2001 Document Revised: 10/16/2014 Document Reviewed: 11/26/2012 Elsevier Interactive Patient Education [employer]  Breastfeeding Deciding to breastfeed is one of the best choices you can make for you and your baby. A change in hormones during pregnancy causes your breast tissue to grow and increases the number and size of your milk ducts. These hormones also allow proteins, sugars, and fats from your blood supply to make breast milk in your milk-producing glands. Hormones prevent breast milk from being released before your baby is born as well as prompt milk flow after birth. Once breastfeeding has begun, thoughts of your baby, as well as his or her sucking or crying, can stimulate the release of milk from your milk-producing glands.  BENEFITS OF BREASTFEEDING For Your Baby  Your first milk (colostrum) helps your baby's digestive system function better.  There are antibodies in your milk that help your baby fight off infections.  Your baby has a lower incidence of asthma, allergies, and sudden infant death syndrome.  The nutrients in breast milk are better for your baby than infant formulas and are designed uniquely for your baby's needs.  Breast milk improves your baby's brain development.  Your baby is less likely to develop other conditions, such as childhood obesity, asthma, or type 2 diabetes mellitus. For  You  Breastfeeding helps to create a very special bond between you and your baby.  Breastfeeding is convenient. Breast milk is always available at the correct temperature and costs nothing.  Breastfeeding helps to burn calories and helps you lose the weight gained during pregnancy.  Breastfeeding makes your uterus contract to its prepregnancy size faster and slows bleeding (lochia) after you give birth.   Breastfeeding helps to lower your risk of developing type 2 diabetes mellitus, osteoporosis, and breast or ovarian cancer later in life.  SIGNS THAT YOUR BABY IS HUNGRY Early Signs of Hunger  Increased alertness or activity.  Stretching.  Movement of the head from side to side.  Movement of the head and opening of the mouth when the corner of the mouth or cheek is stroked (rooting).  Increased sucking sounds, smacking lips, cooing, sighing, or squeaking.  Hand-to-mouth movements.  Increased sucking of fingers or hands. Late Signs of Hunger  Fussing.  Intermittent crying. Extreme Signs of Hunger Signs of extreme hunger will require calming and consoling before your baby will be able to breastfeed successfully. Do not wait for the following signs of extreme hunger to occur before you initiate breastfeeding:  Restlessness.  A loud, strong cry.  Screaming. BREASTFEEDING BASICS Breastfeeding Initiation  Find a comfortable place to sit or lie down, with your neck and back well supported.  Place a pillow or rolled up blanket under your baby to bring him or her to the level of your breast (if you are seated). Nursing pillows are specially designed to help support your arms and your baby while you breastfeed.  Make sure that your baby's abdomen is facing your abdomen.  Gently massage your breast. With your fingertips, massage from your chest wall toward your nipple in a circular motion. This encourages milk flow. You may need to continue this action during the feeding if your  milk flows slowly.  Support your breast with 4 fingers underneath and your thumb above your nipple. Make sure your fingers are well away from your nipple and your baby's mouth.  Stroke your baby's lips gently with your finger or nipple.  When your baby's mouth is open wide enough, quickly bring your baby to your breast, placing your entire nipple and as much of the colored area around your nipple (areola) as possible into your baby's mouth.  More areola should be visible above your baby's upper lip than below the lower lip.  Your baby's tongue should be between his or her lower gum and your breast.  Ensure that your baby's mouth is correctly positioned around your nipple (latched). Your baby's lips should create a seal on your breast and be turned out (everted).  It is common for your baby to suck about 2-3 minutes in order to start the flow of breast milk. Latching Teaching your baby how to latch on to your breast properly is very important. An improper latch can cause nipple pain and decreased milk supply for you and poor weight gain in your baby. Also, if your baby is not latched onto your nipple properly, he or she may swallow some air during feeding. This can make your baby fussy. Burping your baby when you switch breasts during the feeding can help to get rid of the air. However, teaching your baby to latch on properly is still the best way to prevent fussiness from swallowing air while breastfeeding. Signs that your baby has successfully latched on to your nipple:  Silent tugging or silent sucking, without causing you pain.  Swallowing heard between every 3-4 sucks.  Muscle movement above and in front of his or her ears while sucking. Signs that your baby has not successfully latched on to nipple:  Sucking sounds or smacking sounds from your baby while breastfeeding.  Nipple pain. If you think your baby has not latched on correctly, slip your finger into the corner of your baby's  mouth to break the suction and place it between your baby's gums. Attempt breastfeeding initiation again. Signs of Successful Breastfeeding  Signs from your baby:  A gradual decrease in the number of sucks or complete cessation of sucking.  Falling asleep.  Relaxation of his or her body.  Retention of a small amount of milk in his or her mouth.  Letting go of your breast by himself or herself. Signs from you:  Breasts that have increased in firmness, weight, and size 1-3 hours after feeding.  Breasts that are softer immediately after breastfeeding.  Increased milk volume, as well as a change in milk consistency and color by the fifth day of breastfeeding.  Nipples that are not sore, cracked, or bleeding. Signs That Your Pecola Leisure is Getting Enough Milk  Wetting at least 3 diapers in a 24-hour period. The urine should be clear and pale yellow by age 25 days.  At least 3 stools in a 24-hour period by age 25 days. The stool should be soft and yellow.  At least 3 stools in a 24-hour period by age 792 days. The stool should be seedy and yellow.  No loss of weight greater than 10% of birth weight during the first 56 days of age.  Average weight gain of 4-7 ounces (113-198 g) per week after age 79 days.  Consistent daily weight gain by age 25 days, without weight loss after the age of 2 weeks. After a feeding, your baby may spit up a small amount. This is common. BREASTFEEDING FREQUENCY AND DURATION Frequent feeding will help you make more milk and can prevent sore nipples and breast engorgement. Breastfeed when you feel the need to reduce the fullness of your breasts or when your baby shows signs of hunger. This is called "breastfeeding on demand." Avoid introducing a pacifier to your baby while you are working to establish breastfeeding (the first 4-6 weeks after your baby is born). After this time you may choose to use a pacifier. Research has shown that pacifier use during the first year of a  baby's life decreases the risk of sudden infant death syndrome (SIDS). Allow your baby to feed on each breast as long as he or she wants. Breastfeed until your baby is finished feeding. When your baby unlatches or falls asleep while feeding from the first breast, offer the second breast. Because newborns are often sleepy in the first few weeks of life, you may need to awaken your baby to get him or her to feed. Breastfeeding times will vary from baby to baby. However, the following rules can serve as a guide to help you ensure that your baby is properly fed:  Newborns (babies 8 weeks of age or younger) may breastfeed every 1-3 hours.  Newborns should not go longer than 3 hours during the day or 5 hours during the night without breastfeeding.  You should breastfeed your baby a minimum of 8 times in a 24-hour period until you begin to introduce solid foods to your baby at around 40 months of age. BREAST MILK PUMPING Pumping and storing breast milk allows you to ensure that your baby is exclusively fed your breast milk, even at times when you are unable to breastfeed. This is especially important if you are going back to work while you are still breastfeeding or when you are not able to be present during feedings. Your lactation consultant can give you guidelines on how long it is safe to store breast milk. A breast pump is a machine that allows you to pump milk from your breast into a sterile bottle. The pumped breast milk can then  be stored in a refrigerator or freezer. Some breast pumps are operated by hand, while others use electricity. Ask your lactation consultant which type will work best for you. Breast pumps can be purchased, but some hospitals and breastfeeding support groups lease breast pumps on a monthly basis. A lactation consultant can teach you how to hand express breast milk, if you prefer not to use a pump. CARING FOR YOUR BREASTS WHILE YOU BREASTFEED Nipples can become dry, cracked, and  sore while breastfeeding. The following recommendations can help keep your breasts moisturized and healthy:  Avoid using soap on your nipples.  Wear a supportive bra. Although not required, special nursing bras and tank tops are designed to allow access to your breasts for breastfeeding without taking off your entire bra or top. Avoid wearing underwire-style bras or extremely tight bras.  Air dry your nipples for 3-41minutes after each feeding.  Use only cotton bra pads to absorb leaked breast milk. Leaking of breast milk between feedings is normal.  Use lanolin on your nipples after breastfeeding. Lanolin helps to maintain your skin's normal moisture barrier. If you use pure lanolin, you do not need to wash it off before feeding your baby again. Pure lanolin is not toxic to your baby. You may also hand express a few drops of breast milk and gently massage that milk into your nipples and allow the milk to air dry. In the first few weeks after giving birth, some women experience extremely full breasts (engorgement). Engorgement can make your breasts feel heavy, warm, and tender to the touch. Engorgement peaks within 3-5 days after you give birth. The following recommendations can help ease engorgement:  Completely empty your breasts while breastfeeding or pumping. You may want to start by applying warm, moist heat (in the shower or with warm water-soaked hand towels) just before feeding or pumping. This increases circulation and helps the milk flow. If your baby does not completely empty your breasts while breastfeeding, pump any extra milk after he or she is finished.  Wear a snug bra (nursing or regular) or tank top for 1-2 days to signal your body to slightly decrease milk production.  Apply ice packs to your breasts, unless this is too uncomfortable for you.  Make sure that your baby is latched on and positioned properly while breastfeeding. If engorgement persists after 48 hours of following  these recommendations, contact your health care provider or a Advertising copywriter. OVERALL HEALTH CARE RECOMMENDATIONS WHILE BREASTFEEDING  Eat healthy foods. Alternate between meals and snacks, eating 3 of each per day. Because what you eat affects your breast milk, some of the foods may make your baby more irritable than usual. Avoid eating these foods if you are sure that they are negatively affecting your baby.  Drink milk, fruit juice, and water to satisfy your thirst (about 10 glasses a day).  Rest often, relax, and continue to take your prenatal vitamins to prevent fatigue, stress, and anemia.  Continue breast self-awareness checks.  Avoid chewing and smoking tobacco. Chemicals from cigarettes that pass into breast milk and exposure to secondhand smoke may harm your baby.  Avoid alcohol and drug use, including marijuana. Some medicines that may be harmful to your baby can pass through breast milk. It is important to ask your health care provider before taking any medicine, including all over-the-counter and prescription medicine as well as vitamin and herbal supplements. It is possible to become pregnant while breastfeeding. If birth control is desired, ask your health care  provider about options that will be safe for your baby. SEEK MEDICAL CARE IF:  You feel like you want to stop breastfeeding or have become frustrated with breastfeeding.  You have painful breasts or nipples.  Your nipples are cracked or bleeding.  Your breasts are red, tender, or warm.  You have a swollen area on either breast.  You have a fever or chills.  You have nausea or vomiting.  You have drainage other than breast milk from your nipples.  Your breasts do not become full before feedings by the fifth day after you give birth.  You feel sad and depressed.  Your baby is too sleepy to eat well.  Your baby is having trouble sleeping.   Your baby is wetting less than 3 diapers in a 24-hour  period.  Your baby has less than 3 stools in a 24-hour period.  Your baby's skin or the white part of his or her eyes becomes yellow.   Your baby is not gaining weight by 62 days of age. SEEK IMMEDIATE MEDICAL CARE IF:  Your baby is overly tired (lethargic) and does not want to wake up and feed.  Your baby develops an unexplained fever.   This information is not intended to replace advice given to you by your health care provider. Make sure you discuss any questions you have with your health care provider.   Document Released: 09/25/2005 Document Revised: 06/16/2015 Document Reviewed: 03/19/2013 Elsevier Interactive Patient Education [employer]

## 2015-12-21 LAB — HIV ANTIBODY (ROUTINE TESTING W REFLEX): HIV 1&2 Ab, 4th Generation: NONREACTIVE

## 2015-12-21 LAB — GLUCOSE TOLERANCE, 1 HOUR (50G) W/O FASTING: GLUCOSE, 1 HR, GESTATIONAL: 130 mg/dL (ref <140)

## 2015-12-23 ENCOUNTER — Other Ambulatory Visit (INDEPENDENT_AMBULATORY_CARE_PROVIDER_SITE_OTHER): Payer: Medicaid Other | Admitting: *Deleted

## 2015-12-23 VITALS — BP 126/77 | HR 92 | Resp 16 | Wt 148.0 lb

## 2015-12-23 DIAGNOSIS — R300 Dysuria: Principal | ICD-10-CM

## 2015-12-23 DIAGNOSIS — R309 Painful micturition, unspecified: Secondary | ICD-10-CM

## 2015-12-23 DIAGNOSIS — O26893 Other specified pregnancy related conditions, third trimester: Secondary | ICD-10-CM | POA: Diagnosis not present

## 2015-12-23 LAB — RPR

## 2015-12-23 MED ORDER — PHENAZOPYRIDINE HCL 200 MG PO TABS
200.0000 mg | ORAL_TABLET | Freq: Three times a day (TID) | ORAL | Status: DC | PRN
Start: 2015-12-23 — End: 2016-02-19

## 2015-12-23 MED ORDER — NITROFURANTOIN MONOHYD MACRO 100 MG PO CAPS: 100.0000 mg | ORAL_CAPSULE | Freq: Two times a day (BID) | ORAL | Status: DC

## 2015-12-23 NOTE — Progress Notes (Addendum)
SUBJECTIVE: Sharon Chandler is a 25 y.o. female who complains of urinary frequency, urgency and dysuria x 3 days, with lower abd pain, without flank pain, fever, chills, or abnormal vaginal discharge or bleeding.   OBJECTIVE: Appears well, in no apparent distress.  Vital signs are normal. The abdomen is soft without tenderness, guarding, mass, rebound or organomegaly. No CVA tenderness or inguinal adenopathy noted. Urine dipstick shows moderate leuks and trace blood  ASSESSMENT: UTI   PLAN:  Will send urine for culture.  Treatment with Macrobid per verbal orders from Dr. Macon LargeAnyanwu - also push fluids, may use Pyridium  Call or return to clinic prn if these symptoms worsen or fail to improve as anticipated.  Arne ClevelandMandy J Hutchinson, CMA   Attending Attestation Plan was discussed with me and I agree with the documentation above.  Jaynie CollinsUGONNA  ANYANWU, MD, FACOG Attending Obstetrician & Gynecologist, [facility]

## 2015-12-24 LAB — CULTURE, OB URINE

## 2016-01-03 ENCOUNTER — Ambulatory Visit (INDEPENDENT_AMBULATORY_CARE_PROVIDER_SITE_OTHER): Payer: Medicaid Other | Admitting: Obstetrics & Gynecology

## 2016-01-03 VITALS — BP 127/89 | HR 102 | Wt 150.0 lb

## 2016-01-03 DIAGNOSIS — Z23 Encounter for immunization: Secondary | ICD-10-CM | POA: Diagnosis not present

## 2016-01-03 DIAGNOSIS — Z3483 Encounter for supervision of other normal pregnancy, third trimester: Secondary | ICD-10-CM

## 2016-01-03 NOTE — Patient Instructions (Signed)
Return to clinic for any scheduled appointments or obstetric concerns, or go to [facility] for evaluation  

## 2016-01-03 NOTE — Progress Notes (Signed)
Subjective:  Sharon Chandler is a 25 y.o. G2P1001 at 3560w3d being seen today for ongoing prenatal care.  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  Patient reports no complaints.  Contractions: Not present. Vag. Bleeding: None.  Movement: Present. Denies leaking of fluid.   The following portions of the patient's history were reviewed and updated as appropriate: allergies, current medications, past family history, past medical history, past social history, past surgical history and problem list. Problem list updated.  Objective:   Filed Vitals:   01/03/16 1625  BP: 127/89  Pulse: 102  Weight: 150 lb (68.04 kg)    Fetal Status: Fetal Heart Rate (bpm): 139 Fundal Height: 31 cm Movement: Present     General:  Alert, oriented and cooperative. Patient is in no acute distress.  Skin: Skin is warm and dry. No rash noted.   Cardiovascular: Normal heart rate noted  Respiratory: Normal respiratory effort, no problems with respiration noted  Abdomen: Soft, gravid, appropriate for gestational age. Pain/Pressure: Present     Pelvic: Vag. Bleeding: None    Cervical exam deferred        Extremities: Normal range of motion.  Edema: None  Mental Status: Normal mood and affect. Normal behavior. Normal judgment and thought content.   Urinalysis: Urine Protein: Negative Urine Glucose: Negative  Assessment and Plan:  Pregnancy: G2P1001 at 1160w3d  1. Need for Tdap vaccination 2. Encounter for supervision of other normal pregnancy in third trimester - Tdap vaccine greater than or equal to 7yo IM; Standing - Tdap vaccine greater than or equal to 7yo IM Preterm labor symptoms and general obstetric precautions including but not limited to vaginal bleeding, contractions, leaking of fluid and fetal movement were reviewed in detail with the patient. Please refer to  After Visit Summary for other counseling recommendations.  Return in about 2 weeks (around 01/17/2016) for OB Visit.   Sharon NewcomerUgonna A Nathanyal Ashmead, MD

## 2016-01-05 ENCOUNTER — Encounter: Payer: Self-pay | Admitting: *Deleted

## 2016-01-19 ENCOUNTER — Ambulatory Visit (INDEPENDENT_AMBULATORY_CARE_PROVIDER_SITE_OTHER): Payer: Medicaid Other | Admitting: Obstetrics & Gynecology

## 2016-01-19 VITALS — BP 117/63 | HR 62 | Wt 157.0 lb

## 2016-01-19 DIAGNOSIS — N12 Tubulo-interstitial nephritis, not specified as acute or chronic: Secondary | ICD-10-CM

## 2016-01-19 DIAGNOSIS — O2303 Infections of kidney in pregnancy, third trimester: Secondary | ICD-10-CM | POA: Insufficient documentation

## 2016-01-19 DIAGNOSIS — Z3483 Encounter for supervision of other normal pregnancy, third trimester: Secondary | ICD-10-CM

## 2016-01-19 MED ORDER — CEPHALEXIN 500 MG PO CAPS: ORAL_CAPSULE | ORAL | Status: DC

## 2016-01-19 NOTE — Progress Notes (Addendum)
Subjective:  Bess HarvestBreanna A Lippman is a 25 y.o. G2P1001 at 1345w5d being seen today for ongoing prenatal care. [facility] from 01/16/16 - 01/18/16.  Urine culture positive for E.coli.  Was treated with Rocephin and discharged home on Keflex which she is currently taking.  Of note, also received BMZ on 4/9 and 4/10 as she was noted to have preterm contractions, cervical exam of 2/40/ballotable. FFN negative No further symptoms/signs of progressing PTL.  She is currently monitored for the following issues for this low-risk pregnancy and has Supervision of normal subsequent pregnancy; Pneumatocele of lung (HCC); and Pyelonephritis affecting pregnancy in third trimester on her problem list.  Patient reports no complaints.  Contractions: Not present. Vag. Bleeding: None.  Movement: Present. Denies leaking of fluid.   The following portions of the patient's history were reviewed and updated as appropriate: allergies, current medications, past family history, past medical history, past social history, past surgical history and problem list. Problem list updated.  Objective:   Filed Vitals:   01/19/16 0816  BP: 117/63  Pulse: 62  Weight: 157 lb (71.215 kg)    Fetal Status: Fetal Heart Rate (bpm): 132 Fundal Height: 34 cm Movement: Present     General:  Alert, oriented and cooperative. Patient is in no acute distress.  Skin: Skin is warm and dry. No rash noted.   Cardiovascular: Normal heart rate noted  Respiratory: Normal respiratory effort, no problems with respiration noted  Abdomen: Soft, gravid, appropriate for gestational age. Pain/Pressure: Present     Pelvic: Vag. Bleeding: None Vag D/C Character: Thin  Cervical exam deferred        Extremities: Normal range of motion.  Edema: None  Mental Status: Normal mood and affect. Normal behavior. Normal judgment and thought content.   Urinalysis: Urine Protein: Negative Urine Glucose: Negative  Assessment and  Plan:  Pregnancy: G2P1001 at 3545w5d  1. Pyelonephritis affecting pregnancy in third trimester Continue antibiotic regimen, will also do suppressive therapy for rest of pregnancy. - cephALEXin (KEFLEX) 500 MG capsule; Take one capsule by mouth three times daily for ten days; then one tablet daily for rest of pregnancy  Dispense: 60 capsule; Refill: 1  2. Encounter for supervision of other normal pregnancy in third trimester Preterm labor symptoms and general obstetric precautions including but not limited to vaginal bleeding, contractions, leaking of fluid and fetal movement were reviewed in detail with the patient. Please refer to After Visit Summary for other counseling recommendations.  Return in about 2 weeks (around 02/02/2016) for OB Visit, Pelvic cultures.   Tereso NewcomerUgonna A Tremar Wickens, MD

## 2016-01-19 NOTE — Patient Instructions (Signed)
Return to clinic for any scheduled appointments or obstetric concerns, or go to [facility] for evaluation  

## 2016-01-31 ENCOUNTER — Ambulatory Visit (INDEPENDENT_AMBULATORY_CARE_PROVIDER_SITE_OTHER): Payer: Medicaid Other | Admitting: Obstetrics and Gynecology

## 2016-01-31 ENCOUNTER — Encounter: Payer: Self-pay | Admitting: Obstetrics and Gynecology

## 2016-01-31 VITALS — BP 123/77 | HR 67 | Wt 153.0 lb

## 2016-01-31 DIAGNOSIS — O2303 Infections of kidney in pregnancy, third trimester: Secondary | ICD-10-CM

## 2016-01-31 DIAGNOSIS — N12 Tubulo-interstitial nephritis, not specified as acute or chronic: Secondary | ICD-10-CM

## 2016-01-31 DIAGNOSIS — Z3483 Encounter for supervision of other normal pregnancy, third trimester: Secondary | ICD-10-CM

## 2016-01-31 NOTE — Progress Notes (Signed)
Subjective:  Sharon HarvestBreanna A Chandler is a 25 y.o. G2P1001 at 4234w3d being seen today for ongoing prenatal care.  She is currently monitored for the following issues for this high-risk pregnancy and has Supervision of normal subsequent pregnancy; Pneumatocele of lung (HCC); and Pyelonephritis affecting pregnancy in third trimester on her problem list.  Patient reports no complaints.  Contractions: Not present. Vag. Bleeding: None.  Movement: Present. Denies leaking of fluid.   The following portions of the patient's history were reviewed and updated as appropriate: allergies, current medications, past family history, past medical history, past social history, past surgical history and problem list. Problem list updated.  Objective:   Filed Vitals:   01/31/16 1529  BP: 123/77  Pulse: 67  Weight: 153 lb (69.4 kg)    Fetal Status: Fetal Heart Rate (bpm): 138 Fundal Height: 35 cm Movement: Present     General:  Alert, oriented and cooperative. Patient is in no acute distress.  Skin: Skin is warm and dry. No rash noted.   Cardiovascular: Normal heart rate noted  Respiratory: Normal respiratory effort, no problems with respiration noted  Abdomen: Soft, gravid, appropriate for gestational age. Pain/Pressure: Present     Pelvic: Vag. Bleeding: None Vag D/C Character: Thin   Cervical exam deferred        Extremities: Normal range of motion.  Edema: Trace  Mental Status: Normal mood and affect. Normal behavior. Normal judgment and thought content.   Urinalysis: Urine Protein: Negative Urine Glucose: Negative  Assessment and Plan:  Pregnancy: G2P1001 at 5834w3d  1. Encounter for supervision of other normal pregnancy in third trimester Patient is doing well Cultures next visit with sensitivies  2. Pyelonephritis affecting pregnancy in third trimester Continue daily keflex  Preterm labor symptoms and general obstetric precautions including but not limited to vaginal bleeding, contractions,  leaking of fluid and fetal movement were reviewed in detail with the patient. Please refer to After Visit Summary for other counseling recommendations.  Return in about 1 week (around 02/07/2016).   Catalina AntiguaPeggy Akane Tessier, MD

## 2016-02-01 ENCOUNTER — Encounter: Payer: Medicaid Other | Admitting: Obstetrics & Gynecology

## 2016-02-02 ENCOUNTER — Telehealth: Payer: Self-pay

## 2016-02-02 NOTE — Telephone Encounter (Signed)
Called patient to reschedule an upcoming appt on 02/07/2016. No provider available that afternoon. Called pt to move appt, no answer, automated recording stating "can not receive calls at this time"

## 2016-02-03 ENCOUNTER — Telehealth: Payer: Self-pay

## 2016-02-03 NOTE — Telephone Encounter (Signed)
Called patient several time to try and notify her that a MD will not be available for her upcoming appointment on 02/07/2016. The home number listed in her chart is no longer in service, and the mobile number gives an automated response that "the person at this number can not receive calls at this time."

## 2016-02-07 ENCOUNTER — Telehealth: Payer: Self-pay

## 2016-02-07 ENCOUNTER — Other Ambulatory Visit (HOSPITAL_COMMUNITY)
Admission: RE | Admit: 2016-02-07 | Discharge: 2016-02-07 | Disposition: A | Discharge status: Home / Self Care | Payer: Medicaid Other | Source: Ambulatory Visit | Attending: Obstetrics & Gynecology | Admitting: Obstetrics & Gynecology

## 2016-02-07 ENCOUNTER — Ambulatory Visit (INDEPENDENT_AMBULATORY_CARE_PROVIDER_SITE_OTHER): Payer: Medicaid Other | Admitting: Obstetrics & Gynecology

## 2016-02-07 ENCOUNTER — Encounter: Payer: Medicaid Other | Admitting: Obstetrics & Gynecology

## 2016-02-07 VITALS — BP 124/83 | HR 103 | Wt 152.0 lb

## 2016-02-07 DIAGNOSIS — Z36 Encounter for antenatal screening of mother: Secondary | ICD-10-CM

## 2016-02-07 DIAGNOSIS — Z113 Encounter for screening for infections with a predominantly sexual mode of transmission: Secondary | ICD-10-CM | POA: Diagnosis not present

## 2016-02-07 DIAGNOSIS — J984 Other disorders of lung: Secondary | ICD-10-CM

## 2016-02-07 DIAGNOSIS — Z3483 Encounter for supervision of other normal pregnancy, third trimester: Secondary | ICD-10-CM

## 2016-02-07 DIAGNOSIS — O2303 Infections of kidney in pregnancy, third trimester: Secondary | ICD-10-CM

## 2016-02-07 DIAGNOSIS — N12 Tubulo-interstitial nephritis, not specified as acute or chronic: Secondary | ICD-10-CM

## 2016-02-07 LAB — OB RESULTS CONSOLE GBS: STREP GROUP B AG: POSITIVE

## 2016-02-07 LAB — OB RESULTS CONSOLE GC/CHLAMYDIA: GC PROBE AMP, GENITAL: NEGATIVE

## 2016-02-07 NOTE — Progress Notes (Signed)
Subjective:  Sharon Chandler is a 25 y.o. G2P1001 at 1149w3d being seen today for ongoing prenatal care.  She is currently monitored for the following issues for this low-risk pregnancy and has Supervision of normal subsequent pregnancy; Pneumatocele of lung (HCC); and Pyelonephritis affecting pregnancy in third trimester on her problem list.  Patient reports no complaints.  Contractions: Not present. Vag. Bleeding: None.  Movement: Present. Denies leaking of fluid.   The following portions of the patient's history were reviewed and updated as appropriate: allergies, current medications, past family history, past medical history, past social history, past surgical history and problem list. Problem list updated.  Objective:   Filed Vitals:   02/07/16 1044  BP: 124/83  Pulse: 103  Weight: 152 lb (68.947 kg)    Fetal Status: Fetal Heart Rate (bpm): 148   Movement: Present     General:  Alert, oriented and cooperative. Patient is in no acute distress.  Skin: Skin is warm and dry. No rash noted.   Cardiovascular: Normal heart rate noted  Respiratory: Normal respiratory effort, no problems with respiration noted  Abdomen: Soft, gravid, appropriate for gestational age. Pain/Pressure: Present     Pelvic: Vag. Bleeding: None Vag D/C Character: Thin   Cervical exam performed        Extremities: Normal range of motion.  Edema: Trace  Mental Status: Normal mood and affect. Normal behavior. Normal judgment and thought content.   Urinalysis: Urine Protein: Negative Urine Glucose: Negative  Assessment and Plan:  Pregnancy: G2P1001 at 2849w3d  1. Encounter for supervision of other normal pregnancy in third trimester  - GC/Chlamydia probe amp ([place] - Culture, beta strep (group b only)  2. Pyelonephritis affecting pregnancy in third trimester   3. Pneumatocele of lung (HCC)   Preterm labor symptoms and general obstetric precautions including but not limited to vaginal  bleeding, contractions, leaking of fluid and fetal movement were reviewed in detail with the patient. Please refer to After Visit Summary for other counseling recommendations.  Return in about 1 week (around 02/14/2016).   Allie BossierMyra C Halli Equihua, MD

## 2016-02-07 NOTE — Telephone Encounter (Signed)
Unable to contact GerrardBreanna w/ the numbers listed on her chart, called father to let him know that we have been trying to get in contact with her. Asked him if he would have her call the office, he agreed

## 2016-02-08 LAB — GC/CHLAMYDIA PROBE AMP (~~LOC~~) NOT AT ARMC
Chlamydia: NEGATIVE
NEISSERIA GONORRHEA: NEGATIVE

## 2016-02-11 ENCOUNTER — Encounter: Payer: Self-pay | Admitting: Obstetrics & Gynecology

## 2016-02-11 ENCOUNTER — Other Ambulatory Visit: Payer: Medicaid Other

## 2016-02-11 ENCOUNTER — Encounter (HOSPITAL_BASED_OUTPATIENT_CLINIC_OR_DEPARTMENT_OTHER): Payer: Self-pay | Admitting: Emergency Medicine

## 2016-02-11 ENCOUNTER — Emergency Department (HOSPITAL_BASED_OUTPATIENT_CLINIC_OR_DEPARTMENT_OTHER)
Admission: EM | Admit: 2016-02-11 | Discharge: 2016-02-11 | Disposition: A | Discharge status: Home / Self Care | Payer: Medicaid Other | Attending: Emergency Medicine | Admitting: Emergency Medicine

## 2016-02-11 DIAGNOSIS — R1030 Lower abdominal pain, unspecified: Secondary | ICD-10-CM | POA: Diagnosis not present

## 2016-02-11 DIAGNOSIS — Z3A37 37 weeks gestation of pregnancy: Secondary | ICD-10-CM | POA: Insufficient documentation

## 2016-02-11 DIAGNOSIS — O4703 False labor before 37 completed weeks of gestation, third trimester: Secondary | ICD-10-CM | POA: Diagnosis not present

## 2016-02-11 DIAGNOSIS — O26899 Other specified pregnancy related conditions, unspecified trimester: Secondary | ICD-10-CM

## 2016-02-11 DIAGNOSIS — O26893 Other specified pregnancy related conditions, third trimester: Secondary | ICD-10-CM | POA: Insufficient documentation

## 2016-02-11 DIAGNOSIS — O99820 Streptococcus B carrier state complicating pregnancy: Secondary | ICD-10-CM | POA: Insufficient documentation

## 2016-02-11 DIAGNOSIS — R109 Unspecified abdominal pain: Secondary | ICD-10-CM

## 2016-02-11 DIAGNOSIS — Z87891 Personal history of nicotine dependence: Secondary | ICD-10-CM | POA: Insufficient documentation

## 2016-02-11 DIAGNOSIS — M545 Low back pain: Secondary | ICD-10-CM | POA: Diagnosis not present

## 2016-02-11 DIAGNOSIS — O479 False labor, unspecified: Secondary | ICD-10-CM

## 2016-02-11 LAB — URINALYSIS, ROUTINE W REFLEX MICROSCOPIC
BILIRUBIN URINE: NEGATIVE
Glucose, UA: NEGATIVE mg/dL
HGB URINE DIPSTICK: NEGATIVE
Ketones, ur: NEGATIVE mg/dL
NITRITE: NEGATIVE
PH: 7.5 (ref 5.0–8.0)
Protein, ur: NEGATIVE mg/dL
SPECIFIC GRAVITY, URINE: 1.012 (ref 1.005–1.030)

## 2016-02-11 LAB — URINE MICROSCOPIC-ADD ON

## 2016-02-11 NOTE — ED Notes (Signed)
Pt c/o intermittent low back and low abd pain since Tuesday. Pt denies vaginal bleeding or discharge. Pt is [redacted] weeks pregnant, G2P1.
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
## 2016-02-11 NOTE — ED Notes (Signed)
MD at bedside. 

## 2016-02-11 NOTE — ED Notes (Signed)
OB rapid response reports the baby looks good, no contractions noted. She will call report to her doctor and call us back with the plan. Dr. Anitra LauthPlunkett notified.

## 2016-02-11 NOTE — Discharge Instructions (Signed)
Braxton Hicks Contractions Contractions of the uterus can occur throughout pregnancy. Contractions are not always a sign that you are in labor.  WHAT ARE BRAXTON HICKS CONTRACTIONS?  Contractions that occur before labor are called Braxton Hicks contractions, or false labor. Toward the end of pregnancy (32-34 weeks), these contractions can develop more often and may become more forceful. This is not true labor because these contractions do not result in opening (dilatation) and thinning of the cervix. They are sometimes difficult to tell apart from true labor because these contractions can be forceful and people have different pain tolerances. You should not feel embarrassed if you go to the hospital with false labor. Sometimes, the only way to tell if you are in true labor is for your health care provider to look for changes in the cervix. If there are no prenatal problems or other health problems associated with the pregnancy, it is completely safe to be sent home with false labor and await the onset of true labor. HOW CAN YOU TELL THE DIFFERENCE BETWEEN TRUE AND FALSE LABOR? False Labor  The contractions of false labor are usually shorter and not as hard as those of true labor.   The contractions are usually irregular.   The contractions are often felt in the front of the lower abdomen and in the groin.   The contractions may go away when you walk around or change positions while lying down.   The contractions get weaker and are shorter lasting as time goes on.   The contractions do not usually become progressively stronger, regular, and closer together as with true labor.  True Labor  Contractions in true labor last 30-70 seconds, become very regular, usually become more intense, and increase in frequency.   The contractions do not go away with walking.   The discomfort is usually felt in the top of the uterus and spreads to the lower abdomen and low back.   True labor can be  determined by your health care provider with an exam. This will show that the cervix is dilating and getting thinner.  WHAT TO REMEMBER  Keep up with your usual exercises and follow other instructions given by your health care provider.   Take medicines as directed by your health care provider.   Keep your regular prenatal appointments.   Eat and drink lightly if you think you are going into labor.   If Braxton Hicks contractions are making you uncomfortable:   Change your position from lying down or resting to walking, or from walking to resting.   Sit and rest in a tub of warm water.   Drink 2-3 glasses of water. Dehydration may cause these contractions.   Do slow and deep breathing several times an hour.  WHEN SHOULD I SEEK IMMEDIATE MEDICAL CARE? Seek immediate medical care if:  Your contractions become stronger, more regular, and closer together.   You have fluid leaking or gushing from your vagina.   You have a fever.   You pass blood-tinged mucus.   You have vaginal bleeding.   You have continuous abdominal pain.   You have low back pain that you never had before.   You feel your baby's head pushing down and causing pelvic pressure.   Your baby is not moving as much as it used to.    This information is not intended to replace advice given to you by your health care provider. Make sure you discuss any questions you have with your health care  provider. °  °Document Released: 09/25/2005 Document Revised: 09/30/2013 Document Reviewed: 07/07/2013 °Elsevier Interactive Patient Education ©2016 [employer] ° °Abdominal Pain During Pregnancy °Belly (abdominal) pain is common during pregnancy. Most of the time, it is not a serious problem. Other times, it can be a sign that something is wrong with the pregnancy. Always tell your doctor if you have belly pain. °HOME CARE °Monitor your belly pain for any changes. The following actions may help you feel  better: °· Do not have sex (intercourse) or put anything in your vagina until you feel better. °· Rest until your pain stops. °· Drink clear fluids if you feel sick to your stomach (nauseous). Do not eat solid food until you feel better. °· Only take medicine as told by your doctor. °· Keep all doctor visits as told. °GET HELP RIGHT AWAY IF:  °· You are bleeding, leaking fluid, or pieces of tissue come out of your vagina. °· You have more pain or cramping. °· You keep throwing up (vomiting). °· You have pain when you pee (urinate) or have blood in your pee. °· You have a fever. °· You do not feel your baby moving as much. °· You feel very weak or feel like passing out. °· You have trouble breathing, with or without belly pain. °· You have a very bad headache and belly pain. °· You have fluid leaking from your vagina and belly pain. °· You keep having watery poop (diarrhea). °· Your belly pain does not go away after resting, or the pain gets worse. °MAKE SURE YOU:  °· Understand these instructions. °· Will watch your condition. °· Will get help right away if you are not doing well or get worse. °  °This information is not intended to replace advice given to you by your health care provider. Make sure you discuss any questions you have with your health care provider. °  °Document Released: 09/13/2009 Document Revised: 05/28/2013 Document Reviewed: 04/24/2013 °Elsevier Interactive Patient Education ©2016 Elsevier Inc. ° °

## 2016-02-11 NOTE — ED Notes (Signed)
OB rapid response advises pt is clear to be removed from the OB monitor.

## 2016-02-11 NOTE — Progress Notes (Signed)
1045 [facility] about this 25yo G2P1 @ 37.[redacted] wks GA in with complaints of low abdomen and back pain of and on since Tuesday.  Denies vaginal bleeding or leaking of fluid.  Reports good fetal movement.  [place] [facility] [employer] care.  [id number]  Spoke with Dr. Macon LargeAnyanwu and informed of above and of FHR Category I with slight UI tracing.  Per Dr. Macon LargeAnyanwu patient can come of EFM and is OB cleared.  1132  FHR deceleration noted.  Variability remains good and accels are present.  Will observe for 5 more minutes, then advise ED staff to remove EFM if no further decels noted.

## 2016-02-11 NOTE — ED Notes (Signed)
Pt placed on OB monitor. OB Rapid response called.

## 2016-02-14 ENCOUNTER — Ambulatory Visit (INDEPENDENT_AMBULATORY_CARE_PROVIDER_SITE_OTHER): Payer: Medicaid Other | Admitting: Family Medicine

## 2016-02-14 VITALS — BP 122/78 | HR 68 | Wt 158.0 lb

## 2016-02-14 DIAGNOSIS — Z3483 Encounter for supervision of other normal pregnancy, third trimester: Secondary | ICD-10-CM

## 2016-02-14 NOTE — Progress Notes (Signed)
Subjective:  Sharon Chandler is a 25 y.o. G2P1001 at 414w3d being seen today for ongoing prenatal care.  She is currently monitored for the following issues for this low-risk pregnancy and has Supervision of normal subsequent pregnancy; Pneumatocele of lung (HCC); Pyelonephritis affecting pregnancy in third trimester; and GBS (group B Streptococcus carrier), +RV culture, currently pregnant on her problem list.  Patient reports no complaints.  Contractions: Irregular. Vag. Bleeding: None.  Movement: Present. Denies leaking of fluid.   The following portions of the patient's history were reviewed and updated as appropriate: allergies, current medications, past family history, past medical history, past social history, past surgical history and problem list. Problem list updated.  Objective:   Filed Vitals:   02/14/16 1340  BP: 122/78  Pulse: 68  Weight: 158 lb (71.668 kg)    Fetal Status: Fetal Heart Rate (bpm): 158 Fundal Height: 34 cm Movement: Present  Presentation: Vertex  General:  Alert, oriented and cooperative. Patient is in no acute distress.  Skin: Skin is warm and dry. No rash noted.   Cardiovascular: Normal heart rate noted  Respiratory: Normal respiratory effort, no problems with respiration noted  Abdomen: Soft, gravid, appropriate for gestational age. Pain/Pressure: Present     Pelvic: Vag. Bleeding: None Vag D/C Character: Watery   Cervical exam performed Dilation: 3.5 Effacement (%): 50 Station: -2  Extremities: Normal range of motion.  Edema: Trace  Mental Status: Normal mood and affect. Normal behavior. Normal judgment and thought content.   Urinalysis: Urine Protein: Trace Urine Glucose: Negative SSE: no pool, neg nitrazine, neg fern Assessment and Plan:  Pregnancy: G2P1001 at 7914w3d  1. Encounter for supervision of other normal pregnancy in third trimester Continue routine prenatal care.  Term labor symptoms and general obstetric precautions including but not  limited to vaginal bleeding, contractions, leaking of fluid and fetal movement were reviewed in detail with the patient. Please refer to After Visit Summary for other counseling recommendations.  Return in 1 week (on 02/21/2016).   Reva Boresanya S Gennesis Hogland, MD

## 2016-02-14 NOTE — Patient Instructions (Signed)
Third Trimester of Pregnancy The third trimester is from week 29 through week 42, months 7 through 9. The third trimester is a time when the fetus is growing rapidly. At the end of the ninth month, the fetus is about 20 inches in length and weighs 6-10 pounds.  BODY CHANGES Your body goes through many changes during pregnancy. The changes vary from woman to woman.   Your weight will continue to increase. You can expect to gain 25-35 pounds (11-16 kg) by the end of the pregnancy.  You may begin to get stretch marks on your hips, abdomen, and breasts.  You may urinate more often because the fetus is moving lower into your pelvis and pressing on your bladder.  You may develop or continue to have heartburn as a result of your pregnancy.  You may develop constipation because certain hormones are causing the muscles that push waste through your intestines to slow down.  You may develop hemorrhoids or swollen, bulging veins (varicose veins).  You may have pelvic pain because of the weight gain and pregnancy hormones relaxing your joints between the bones in your pelvis. Backaches may result from overexertion of the muscles supporting your posture.  You may have changes in your hair. These can include thickening of your hair, rapid growth, and changes in texture. Some women also have hair loss during or after pregnancy, or hair that feels dry or thin. Your hair will most likely return to normal after your baby is born.  Your breasts will continue to grow and be tender. A yellow discharge may leak from your breasts called colostrum.  Your belly button may stick out.  You may feel short of breath because of your expanding uterus.  You may notice the fetus "dropping," or moving lower in your abdomen.  You may have a bloody mucus discharge. This usually occurs a few days to a week before labor begins.  Your cervix becomes thin and soft (effaced) near your due date. WHAT TO EXPECT AT YOUR  PRENATAL EXAMS  You will have prenatal exams every 2 weeks until week 36. Then, you will have weekly prenatal exams. During a routine prenatal visit:  You will be weighed to make sure you and the fetus are growing normally.  Your blood pressure is taken.  Your abdomen will be measured to track your baby's growth.  The fetal heartbeat will be listened to.  Any test results from the previous visit will be discussed.  You may have a cervical check near your due date to see if you have effaced. At around 36 weeks, your caregiver will check your cervix. At the same time, your caregiver will also perform a test on the secretions of the vaginal tissue. This test is to determine if a type of bacteria, Group B streptococcus, is present. Your caregiver will explain this further. Your caregiver may ask you:  What your birth plan is.  How you are feeling.  If you are feeling the baby move.  If you have had any abnormal symptoms, such as leaking fluid, bleeding, severe headaches, or abdominal cramping.  If you are using any tobacco products, including cigarettes, chewing tobacco, and electronic cigarettes.  If you have any questions. Other tests or screenings that may be performed during your third trimester include:  Blood tests that check for low iron levels (anemia).  Fetal testing to check the health, activity level, and growth of the fetus. Testing is done if you have certain medical conditions or if   there are problems during the pregnancy.  HIV (human immunodeficiency virus) testing. If you are at high risk, you may be screened for HIV during your third trimester of pregnancy. FALSE LABOR You may feel small, irregular contractions that eventually go away. These are called Braxton Hicks contractions, or false labor. Contractions may last for hours, days, or even weeks before true labor sets in. If contractions come at regular intervals, intensify, or become painful, it is best to be seen  by your caregiver.  SIGNS OF LABOR   Menstrual-like cramps.  Contractions that are 5 minutes apart or less.  Contractions that start on the top of the uterus and spread down to the lower abdomen and back.  A sense of increased pelvic pressure or back pain.  A watery or bloody mucus discharge that comes from the vagina. If you have any of these signs before the 37th week of pregnancy, call your caregiver right away. You need to go to the hospital to get checked immediately. HOME CARE INSTRUCTIONS   Avoid all smoking, herbs, alcohol, and unprescribed drugs. These chemicals affect the formation and growth of the baby.  Do not use any tobacco products, including cigarettes, chewing tobacco, and electronic cigarettes. If you need help quitting, ask your health care provider. You may receive counseling support and other resources to help you quit.  Follow your caregiver's instructions regarding medicine use. There are medicines that are either safe or unsafe to take during pregnancy.  Exercise only as directed by your caregiver. Experiencing uterine cramps is a good sign to stop exercising.  Continue to eat regular, healthy meals.  Wear a good support bra for breast tenderness.  Do not use hot tubs, steam rooms, or saunas.  Wear your seat belt at all times when driving.  Avoid raw meat, uncooked cheese, cat litter boxes, and soil used by cats. These carry germs that can cause birth defects in the baby.  Take your prenatal vitamins.  Take 1500-2000 mg of calcium daily starting at the 20th week of pregnancy until you deliver your baby.  Try taking a stool softener (if your caregiver approves) if you develop constipation. Eat more high-fiber foods, such as fresh vegetables or fruit and whole grains. Drink plenty of fluids to keep your urine clear or pale yellow.  Take warm sitz baths to soothe any pain or discomfort caused by hemorrhoids. Use hemorrhoid cream if your caregiver  approves.  If you develop varicose veins, wear support hose. Elevate your feet for 15 minutes, 3-4 times a day. Limit salt in your diet.  Avoid heavy lifting, wear low heal shoes, and practice good posture.  Rest a lot with your legs elevated if you have leg cramps or low back pain.  Visit your dentist if you have not gone during your pregnancy. Use a soft toothbrush to brush your teeth and be gentle when you floss.  A sexual relationship may be continued unless your caregiver directs you otherwise.  Do not travel far distances unless it is absolutely necessary and only with the approval of your caregiver.  Take prenatal classes to understand, practice, and ask questions about the labor and delivery.  Make a trial run to the hospital.  Pack your hospital bag.  Prepare the baby's nursery.  Continue to go to all your prenatal visits as directed by your caregiver. SEEK MEDICAL CARE IF:  You are unsure if you are in labor or if your water has broken.  You have dizziness.  You have   mild pelvic cramps, pelvic pressure, or nagging pain in your abdominal area.  You have persistent nausea, vomiting, or diarrhea.  You have a bad smelling vaginal discharge.  You have pain with urination. SEEK IMMEDIATE MEDICAL CARE IF:   You have a fever.  You are leaking fluid from your vagina.  You have spotting or bleeding from your vagina.  You have severe abdominal cramping or pain.  You have rapid weight loss or gain.  You have shortness of breath with chest pain.  You notice sudden or extreme swelling of your face, hands, ankles, feet, or legs.  You have not felt your baby move in over an hour.  You have severe headaches that do not go away with medicine.  You have vision changes.   This information is not intended to replace advice given to you by your health care provider. Make sure you discuss any questions you have with your health care provider.   Document Released:  09/19/2001 Document Revised: 10/16/2014 Document Reviewed: 11/26/2012 Elsevier Interactive Patient Education 2016 [employer]  Breastfeeding Deciding to breastfeed is one of the best choices you can make for you and your baby. A change in hormones during pregnancy causes your breast tissue to grow and increases the number and size of your milk ducts. These hormones also allow proteins, sugars, and fats from your blood supply to make breast milk in your milk-producing glands. Hormones prevent breast milk from being released before your baby is born as well as prompt milk flow after birth. Once breastfeeding has begun, thoughts of your baby, as well as his or her sucking or crying, can stimulate the release of milk from your milk-producing glands.  BENEFITS OF BREASTFEEDING For Your Baby  Your first milk (colostrum) helps your baby's digestive system function better.  There are antibodies in your milk that help your baby fight off infections.  Your baby has a lower incidence of asthma, allergies, and sudden infant death syndrome.  The nutrients in breast milk are better for your baby than infant formulas and are designed uniquely for your baby's needs.  Breast milk improves your baby's brain development.  Your baby is less likely to develop other conditions, such as childhood obesity, asthma, or type 2 diabetes mellitus. For You  Breastfeeding helps to create a very special bond between you and your baby.  Breastfeeding is convenient. Breast milk is always available at the correct temperature and costs nothing.  Breastfeeding helps to burn calories and helps you lose the weight gained during pregnancy.  Breastfeeding makes your uterus contract to its prepregnancy size faster and slows bleeding (lochia) after you give birth.   Breastfeeding helps to lower your risk of developing type 2 diabetes mellitus, osteoporosis, and breast or ovarian cancer later in life. SIGNS THAT YOUR BABY IS  HUNGRY Early Signs of Hunger  Increased alertness or activity.  Stretching.  Movement of the head from side to side.  Movement of the head and opening of the mouth when the corner of the mouth or cheek is stroked (rooting).  Increased sucking sounds, smacking lips, cooing, sighing, or squeaking.  Hand-to-mouth movements.  Increased sucking of fingers or hands. Late Signs of Hunger  Fussing.  Intermittent crying. Extreme Signs of Hunger Signs of extreme hunger will require calming and consoling before your baby will be able to breastfeed successfully. Do not wait for the following signs of extreme hunger to occur before you initiate breastfeeding:  Restlessness.  A loud, strong cry.  Screaming.   BREASTFEEDING BASICS Breastfeeding Initiation  Find a comfortable place to sit or lie down, with your neck and back well supported.  Place a pillow or rolled up blanket under your baby to bring him or her to the level of your breast (if you are seated). Nursing pillows are specially designed to help support your arms and your baby while you breastfeed.  Make sure that your baby's abdomen is facing your abdomen.  Gently massage your breast. With your fingertips, massage from your chest wall toward your nipple in a circular motion. This encourages milk flow. You may need to continue this action during the feeding if your milk flows slowly.  Support your breast with 4 fingers underneath and your thumb above your nipple. Make sure your fingers are well away from your nipple and your baby's mouth.  Stroke your baby's lips gently with your finger or nipple.  When your baby's mouth is open wide enough, quickly bring your baby to your breast, placing your entire nipple and as much of the colored area around your nipple (areola) as possible into your baby's mouth.  More areola should be visible above your baby's upper lip than below the lower lip.  Your baby's tongue should be between his  or her lower gum and your breast.  Ensure that your baby's mouth is correctly positioned around your nipple (latched). Your baby's lips should create a seal on your breast and be turned out (everted).  It is common for your baby to suck about 2-3 minutes in order to start the flow of breast milk. Latching Teaching your baby how to latch on to your breast properly is very important. An improper latch can cause nipple pain and decreased milk supply for you and poor weight gain in your baby. Also, if your baby is not latched onto your nipple properly, he or she may swallow some air during feeding. This can make your baby fussy. Burping your baby when you switch breasts during the feeding can help to get rid of the air. However, teaching your baby to latch on properly is still the best way to prevent fussiness from swallowing air while breastfeeding. Signs that your baby has successfully latched on to your nipple:  Silent tugging or silent sucking, without causing you pain.  Swallowing heard between every 3-4 sucks.  Muscle movement above and in front of his or her ears while sucking. Signs that your baby has not successfully latched on to nipple:  Sucking sounds or smacking sounds from your baby while breastfeeding.  Nipple pain. If you think your baby has not latched on correctly, slip your finger into the corner of your baby's mouth to break the suction and place it between your baby's gums. Attempt breastfeeding initiation again. Signs of Successful Breastfeeding Signs from your baby:  A gradual decrease in the number of sucks or complete cessation of sucking.  Falling asleep.  Relaxation of his or her body.  Retention of a small amount of milk in his or her mouth.  Letting go of your breast by himself or herself. Signs from you:  Breasts that have increased in firmness, weight, and size 1-3 hours after feeding.  Breasts that are softer immediately after  breastfeeding.  Increased milk volume, as well as a change in milk consistency and color by the fifth day of breastfeeding.  Nipples that are not sore, cracked, or bleeding. Signs That Your Baby is Getting Enough Milk  Wetting at least 3 diapers in a 24-hour period.   The urine should be clear and pale yellow by age 5 days.  At least 3 stools in a 24-hour period by age 5 days. The stool should be soft and yellow.  At least 3 stools in a 24-hour period by age 7 days. The stool should be seedy and yellow.  No loss of weight greater than 10% of birth weight during the first 3 days of age.  Average weight gain of 4-7 ounces (113-198 g) per week after age 4 days.  Consistent daily weight gain by age 5 days, without weight loss after the age of 2 weeks. After a feeding, your baby may spit up a small amount. This is common. BREASTFEEDING FREQUENCY AND DURATION Frequent feeding will help you make more milk and can prevent sore nipples and breast engorgement. Breastfeed when you feel the need to reduce the fullness of your breasts or when your baby shows signs of hunger. This is called "breastfeeding on demand." Avoid introducing a pacifier to your baby while you are working to establish breastfeeding (the first 4-6 weeks after your baby is born). After this time you may choose to use a pacifier. Research has shown that pacifier use during the first year of a baby's life decreases the risk of sudden infant death syndrome (SIDS). Allow your baby to feed on each breast as long as he or she wants. Breastfeed until your baby is finished feeding. When your baby unlatches or falls asleep while feeding from the first breast, offer the second breast. Because newborns are often sleepy in the first few weeks of life, you may need to awaken your baby to get him or her to feed. Breastfeeding times will vary from baby to baby. However, the following rules can serve as a guide to help you ensure that your baby is  properly fed:  Newborns (babies 4 weeks of age or younger) may breastfeed every 1-3 hours.  Newborns should not go longer than 3 hours during the day or 5 hours during the night without breastfeeding.  You should breastfeed your baby a minimum of 8 times in a 24-hour period until you begin to introduce solid foods to your baby at around 6 months of age. BREAST MILK PUMPING Pumping and storing breast milk allows you to ensure that your baby is exclusively fed your breast milk, even at times when you are unable to breastfeed. This is especially important if you are going back to work while you are still breastfeeding or when you are not able to be present during feedings. Your lactation consultant can give you guidelines on how long it is safe to store breast milk. A breast pump is a machine that allows you to pump milk from your breast into a sterile bottle. The pumped breast milk can then be stored in a refrigerator or freezer. Some breast pumps are operated by hand, while others use electricity. Ask your lactation consultant which type will work best for you. Breast pumps can be purchased, but some hospitals and breastfeeding support groups lease breast pumps on a monthly basis. A lactation consultant can teach you how to hand express breast milk, if you prefer not to use a pump. CARING FOR YOUR BREASTS WHILE YOU BREASTFEED Nipples can become dry, cracked, and sore while breastfeeding. The following recommendations can help keep your breasts moisturized and healthy:  Avoid using soap on your nipples.  Wear a supportive bra. Although not required, special nursing bras and tank tops are designed to allow access to your   breasts for breastfeeding without taking off your entire bra or top. Avoid wearing underwire-style bras or extremely tight bras.  Air dry your nipples for 3-4minutes after each feeding.  Use only cotton bra pads to absorb leaked breast milk. Leaking of breast milk between feedings  is normal.  Use lanolin on your nipples after breastfeeding. Lanolin helps to maintain your skin's normal moisture barrier. If you use pure lanolin, you do not need to wash it off before feeding your baby again. Pure lanolin is not toxic to your baby. You may also hand express a few drops of breast milk and gently massage that milk into your nipples and allow the milk to air dry. In the first few weeks after giving birth, some women experience extremely full breasts (engorgement). Engorgement can make your breasts feel heavy, warm, and tender to the touch. Engorgement peaks within 3-5 days after you give birth. The following recommendations can help ease engorgement:  Completely empty your breasts while breastfeeding or pumping. You may want to start by applying warm, moist heat (in the shower or with warm water-soaked hand towels) just before feeding or pumping. This increases circulation and helps the milk flow. If your baby does not completely empty your breasts while breastfeeding, pump any extra milk after he or she is finished.  Wear a snug bra (nursing or regular) or tank top for 1-2 days to signal your body to slightly decrease milk production.  Apply ice packs to your breasts, unless this is too uncomfortable for you.  Make sure that your baby is latched on and positioned properly while breastfeeding. If engorgement persists after 48 hours of following these recommendations, contact your health care provider or a lactation consultant. OVERALL HEALTH CARE RECOMMENDATIONS WHILE BREASTFEEDING  Eat healthy foods. Alternate between meals and snacks, eating 3 of each per day. Because what you eat affects your breast milk, some of the foods may make your baby more irritable than usual. Avoid eating these foods if you are sure that they are negatively affecting your baby.  Drink milk, fruit juice, and water to satisfy your thirst (about 10 glasses a day).  Rest often, relax, and continue to take  your prenatal vitamins to prevent fatigue, stress, and anemia.  Continue breast self-awareness checks.  Avoid chewing and smoking tobacco. Chemicals from cigarettes that pass into breast milk and exposure to secondhand smoke may harm your baby.  Avoid alcohol and drug use, including marijuana. Some medicines that may be harmful to your baby can pass through breast milk. It is important to ask your health care provider before taking any medicine, including all over-the-counter and prescription medicine as well as vitamin and herbal supplements. It is possible to become pregnant while breastfeeding. If birth control is desired, ask your health care provider about options that will be safe for your baby. SEEK MEDICAL CARE IF:  You feel like you want to stop breastfeeding or have become frustrated with breastfeeding.  You have painful breasts or nipples.  Your nipples are cracked or bleeding.  Your breasts are red, tender, or warm.  You have a swollen area on either breast.  You have a fever or chills.  You have nausea or vomiting.  You have drainage other than breast milk from your nipples.  Your breasts do not become full before feedings by the fifth day after you give birth.  You feel sad and depressed.  Your baby is too sleepy to eat well.  Your baby is having trouble sleeping.     Your baby is wetting less than 3 diapers in a 24-hour period.  Your baby has less than 3 stools in a 24-hour period.  Your baby's skin or the white part of his or her eyes becomes yellow.   Your baby is not gaining weight by 5 days of age. SEEK IMMEDIATE MEDICAL CARE IF:  Your baby is overly tired (lethargic) and does not want to wake up and feed.  Your baby develops an unexplained fever.   This information is not intended to replace advice given to you by your health care provider. Make sure you discuss any questions you have with your health care provider.   Document Released: 09/25/2005  Document Revised: 06/16/2015 Document Reviewed: 03/19/2013 Elsevier Interactive Patient Education 2016 [employer]  

## 2016-02-16 LAB — CULTURE, BETA STREP (GROUP B ONLY)

## 2016-02-19 ENCOUNTER — Inpatient Hospital Stay (HOSPITAL_COMMUNITY)
Admission: AD | Admit: 2016-02-19 | Discharge: 2016-02-21 | DRG: 775 | Disposition: A | Discharge status: Home / Self Care | Payer: Medicaid Other | Source: Ambulatory Visit | Attending: Obstetrics & Gynecology | Admitting: Obstetrics & Gynecology

## 2016-02-19 ENCOUNTER — Inpatient Hospital Stay (HOSPITAL_COMMUNITY)
Admission: AD | Admit: 2016-02-19 | Discharge: 2016-02-19 | Disposition: A | Discharge status: Home / Self Care | Payer: Medicaid Other | Source: Ambulatory Visit | Attending: Obstetrics & Gynecology | Admitting: Obstetrics & Gynecology

## 2016-02-19 ENCOUNTER — Encounter (HOSPITAL_COMMUNITY): Payer: Self-pay | Admitting: *Deleted

## 2016-02-19 DIAGNOSIS — Z880 Allergy status to penicillin: Secondary | ICD-10-CM | POA: Diagnosis not present

## 2016-02-19 DIAGNOSIS — O479 False labor, unspecified: Secondary | ICD-10-CM

## 2016-02-19 DIAGNOSIS — O2303 Infections of kidney in pregnancy, third trimester: Secondary | ICD-10-CM

## 2016-02-19 DIAGNOSIS — Z882 Allergy status to sulfonamides status: Secondary | ICD-10-CM

## 2016-02-19 DIAGNOSIS — IMO0001 Reserved for inherently not codable concepts without codable children: Secondary | ICD-10-CM

## 2016-02-19 DIAGNOSIS — Z3A38 38 weeks gestation of pregnancy: Secondary | ICD-10-CM

## 2016-02-19 DIAGNOSIS — O99824 Streptococcus B carrier state complicating childbirth: Principal | ICD-10-CM | POA: Diagnosis present

## 2016-02-19 DIAGNOSIS — Z87891 Personal history of nicotine dependence: Secondary | ICD-10-CM

## 2016-02-19 DIAGNOSIS — O99820 Streptococcus B carrier state complicating pregnancy: Secondary | ICD-10-CM

## 2016-02-19 DIAGNOSIS — J984 Other disorders of lung: Secondary | ICD-10-CM | POA: Diagnosis present

## 2016-02-19 DIAGNOSIS — R51 Headache: Secondary | ICD-10-CM | POA: Diagnosis present

## 2016-02-19 DIAGNOSIS — R030 Elevated blood-pressure reading, without diagnosis of hypertension: Secondary | ICD-10-CM | POA: Diagnosis present

## 2016-02-19 HISTORY — DX: Unspecified infection of urinary tract in pregnancy, unspecified trimester: O23.40

## 2016-02-19 LAB — CBC
HEMATOCRIT: 32.8 % — AB (ref 36.0–46.0)
HEMOGLOBIN: 11.7 g/dL — AB (ref 12.0–15.0)
MCH: 31 pg (ref 26.0–34.0)
MCHC: 35.7 g/dL (ref 30.0–36.0)
MCV: 86.8 fL (ref 78.0–100.0)
Platelets: 249 10*3/uL (ref 150–400)
RBC: 3.78 MIL/uL — AB (ref 3.87–5.11)
RDW: 13.9 % (ref 11.5–15.5)
WBC: 12.4 10*3/uL — ABNORMAL HIGH (ref 4.0–10.5)

## 2016-02-19 LAB — TYPE AND SCREEN
ABO/RH(D): B POS
ANTIBODY SCREEN: NEGATIVE

## 2016-02-19 MED ORDER — FENTANYL CITRATE (PF) 100 MCG/2ML IJ SOLN
INTRAMUSCULAR | Status: AC
  Administered 2016-02-19: 100 ug
  Filled 2016-02-19: qty 2

## 2016-02-19 MED ORDER — FLEET ENEMA 7-19 GM/118ML RE ENEM: 1.0000 | ENEMA | RECTAL | Status: DC | PRN

## 2016-02-19 MED ORDER — LIDOCAINE HCL (PF) 1 % IJ SOLN: 30.0000 mL | INTRAMUSCULAR | Status: DC | PRN

## 2016-02-19 MED ORDER — OXYTOCIN 40 UNITS IN LACTATED RINGERS INFUSION - SIMPLE MED: 2.5000 [IU]/h | INTRAVENOUS | Status: DC

## 2016-02-19 MED ORDER — ACETAMINOPHEN 325 MG PO TABS
650.0000 mg | ORAL_TABLET | ORAL | Status: DC | PRN
Start: 2016-02-19 — End: 2016-02-20

## 2016-02-19 MED ORDER — HYDROXYZINE HCL 50 MG PO TABS: 50.0000 mg | ORAL_TABLET | Freq: Four times a day (QID) | ORAL | Status: DC | PRN

## 2016-02-19 MED ORDER — LACTATED RINGERS IV SOLN: 500.0000 mL | INTRAVENOUS | Status: DC | PRN

## 2016-02-19 MED ORDER — OXYTOCIN BOLUS FROM INFUSION
500.0000 mL | INTRAVENOUS | Status: DC
  Administered 2016-02-19: 500 mL via INTRAVENOUS

## 2016-02-19 MED ORDER — LIDOCAINE HCL (PF) 1 % IJ SOLN
INTRAMUSCULAR | Status: AC
  Filled 2016-02-19: qty 30

## 2016-02-19 MED ORDER — CITRIC ACID-SODIUM CITRATE 334-500 MG/5ML PO SOLN: 30.0000 mL | ORAL | Status: DC | PRN

## 2016-02-19 MED ORDER — CEFAZOLIN SODIUM-DEXTROSE 2-4 GM/100ML-% IV SOLN: 2.0000 g | Freq: Three times a day (TID) | INTRAVENOUS | Status: DC

## 2016-02-19 MED ORDER — FENTANYL CITRATE (PF) 100 MCG/2ML IJ SOLN: 50.0000 ug | INTRAMUSCULAR | Status: DC | PRN

## 2016-02-19 MED ORDER — ACETAMINOPHEN 325 MG PO TABS
650.0000 mg | ORAL_TABLET | Freq: Once | ORAL | Status: AC
  Administered 2016-02-19: 650 mg via ORAL
  Filled 2016-02-19: qty 2

## 2016-02-19 MED ORDER — OXYCODONE-ACETAMINOPHEN 5-325 MG PO TABS: 1.0000 | ORAL_TABLET | ORAL | Status: DC | PRN

## 2016-02-19 MED ORDER — LACTATED RINGERS IV SOLN
INTRAVENOUS | Status: DC
  Administered 2016-02-19: 23:00:00 via INTRAVENOUS

## 2016-02-19 MED ORDER — OXYTOCIN 40 UNITS IN LACTATED RINGERS INFUSION - SIMPLE MED
INTRAVENOUS | Status: AC
  Filled 2016-02-19: qty 1000

## 2016-02-19 MED ORDER — ONDANSETRON HCL 4 MG/2ML IJ SOLN: 4.0000 mg | Freq: Four times a day (QID) | INTRAMUSCULAR | Status: DC | PRN

## 2016-02-19 MED ORDER — OXYCODONE-ACETAMINOPHEN 5-325 MG PO TABS: 2.0000 | ORAL_TABLET | ORAL | Status: DC | PRN

## 2016-02-19 NOTE — MAU Note (Signed)
C/o stronger ucs around 1100;

## 2016-02-19 NOTE — Progress Notes (Signed)
Dr Alvester MorinNewton notified of pt's admission and status. AWare of ctx pattern, sve, elevated B/Ps with slight h/a. Tylenol order received and will reck cervix in an hour.

## 2016-02-19 NOTE — Discharge Instructions (Signed)
Fetal Movement Counts °Patient Name: __________________________________________________ Patient Due Date: ____________________ °Performing a fetal movement count is highly recommended in high-risk pregnancies, but it is good for every pregnant woman to do. Your health care provider may ask you to start counting fetal movements at 28 weeks of the pregnancy. Fetal movements often increase: °· After eating a full meal. °· After physical activity. °· After eating or drinking something sweet or cold. °· At rest. °Pay attention to when you feel the baby is most active. This will help you notice a pattern of your baby's sleep and wake cycles and what factors contribute to an increase in fetal movement. It is important to perform a fetal movement count at the same time each day when your baby is normally most active.  °HOW TO COUNT FETAL MOVEMENTS °1. Find a quiet and comfortable area to sit or lie down on your left side. Lying on your left side provides the best blood and oxygen circulation to your baby. °2. Write down the day and time on a sheet of paper or in a journal. °3. Start counting kicks, flutters, swishes, rolls, or jabs in a 2-hour period. You should feel at least 10 movements within 2 hours. °4. If you do not feel 10 movements in 2 hours, wait 2-3 hours and count again. Look for a change in the pattern or not enough counts in 2 hours. °SEEK MEDICAL CARE IF: °· You feel less than 10 counts in 2 hours, tried twice. °· There is no movement in over an hour. °· The pattern is changing or taking longer each day to reach 10 counts in 2 hours. °· You feel the baby is not moving as he or she usually does. °Date: ____________ Movements: ____________ Start time: ____________ Finish time: ____________  °Date: ____________ Movements: ____________ Start time: ____________ Finish time: ____________ °Date: ____________ Movements: ____________ Start time: ____________ Finish time: ____________ °Date: ____________ Movements:  ____________ Start time: ____________ Finish time: ____________ °Date: ____________ Movements: ____________ Start time: ____________ Finish time: ____________ °Date: ____________ Movements: ____________ Start time: ____________ Finish time: ____________ °Date: ____________ Movements: ____________ Start time: ____________ Finish time: ____________ °Date: ____________ Movements: ____________ Start time: ____________ Finish time: ____________  °Date: ____________ Movements: ____________ Start time: ____________ Finish time: ____________ °Date: ____________ Movements: ____________ Start time: ____________ Finish time: ____________ °Date: ____________ Movements: ____________ Start time: ____________ Finish time: ____________ °Date: ____________ Movements: ____________ Start time: ____________ Finish time: ____________ °Date: ____________ Movements: ____________ Start time: ____________ Finish time: ____________ °Date: ____________ Movements: ____________ Start time: ____________ Finish time: ____________ °Date: ____________ Movements: ____________ Start time: ____________ Finish time: ____________  °Date: ____________ Movements: ____________ Start time: ____________ Finish time: ____________ °Date: ____________ Movements: ____________ Start time: ____________ Finish time: ____________ °Date: ____________ Movements: ____________ Start time: ____________ Finish time: ____________ °Date: ____________ Movements: ____________ Start time: ____________ Finish time: ____________ °Date: ____________ Movements: ____________ Start time: ____________ Finish time: ____________ °Date: ____________ Movements: ____________ Start time: ____________ Finish time: ____________ °Date: ____________ Movements: ____________ Start time: ____________ Finish time: ____________  °Date: ____________ Movements: ____________ Start time: ____________ Finish time: ____________ °Date: ____________ Movements: ____________ Start time: ____________ Finish  time: ____________ °Date: ____________ Movements: ____________ Start time: ____________ Finish time: ____________ °Date: ____________ Movements: ____________ Start time: ____________ Finish time: ____________ °Date: ____________ Movements: ____________ Start time: ____________ Finish time: ____________ °Date: ____________ Movements: ____________ Start time: ____________ Finish time: ____________ °Date: ____________ Movements: ____________ Start time: ____________ Finish time: ____________  °Date: ____________ Movements: ____________ Start time: ____________ Finish   time: ____________ °Date: ____________ Movements: ____________ Start time: ____________ Finish time: ____________ °Date: ____________ Movements: ____________ Start time: ____________ Finish time: ____________ °Date: ____________ Movements: ____________ Start time: ____________ Finish time: ____________ °Date: ____________ Movements: ____________ Start time: ____________ Finish time: ____________ °Date: ____________ Movements: ____________ Start time: ____________ Finish time: ____________ °Date: ____________ Movements: ____________ Start time: ____________ Finish time: ____________  °Date: ____________ Movements: ____________ Start time: ____________ Finish time: ____________ °Date: ____________ Movements: ____________ Start time: ____________ Finish time: ____________ °Date: ____________ Movements: ____________ Start time: ____________ Finish time: ____________ °Date: ____________ Movements: ____________ Start time: ____________ Finish time: ____________ °Date: ____________ Movements: ____________ Start time: ____________ Finish time: ____________ °Date: ____________ Movements: ____________ Start time: ____________ Finish time: ____________ °Date: ____________ Movements: ____________ Start time: ____________ Finish time: ____________  °Date: ____________ Movements: ____________ Start time: ____________ Finish time: ____________ °Date: ____________  Movements: ____________ Start time: ____________ Finish time: ____________ °Date: ____________ Movements: ____________ Start time: ____________ Finish time: ____________ °Date: ____________ Movements: ____________ Start time: ____________ Finish time: ____________ °Date: ____________ Movements: ____________ Start time: ____________ Finish time: ____________ °Date: ____________ Movements: ____________ Start time: ____________ Finish time: ____________ °Date: ____________ Movements: ____________ Start time: ____________ Finish time: ____________  °Date: ____________ Movements: ____________ Start time: ____________ Finish time: ____________ °Date: ____________ Movements: ____________ Start time: ____________ Finish time: ____________ °Date: ____________ Movements: ____________ Start time: ____________ Finish time: ____________ °Date: ____________ Movements: ____________ Start time: ____________ Finish time: ____________ °Date: ____________ Movements: ____________ Start time: ____________ Finish time: ____________ °Date: ____________ Movements: ____________ Start time: ____________ Finish time: ____________ °  °This information is not intended to replace advice given to you by your health care provider. Make sure you discuss any questions you have with your health care provider. °  °Document Released: 10/25/2006 Document Revised: 10/16/2014 Document Reviewed: 07/22/2012 °Elsevier Interactive Patient Education ©2016 [employer] °Vaginal Delivery °During delivery, your health care provider will help you give birth to your baby. During a vaginal delivery, you will work to push the baby out of your vagina. However, before you can push your baby out, a few things need to happen. The opening of your uterus (cervix) has to soften, thin out, and open up (dilate) all the way to 10 cm. Also, your baby has to move down from the uterus into your vagina.  °SIGNS OF LABOR  °Your health care provider will first need to make sure you  are in labor. Signs of labor include:  °· Passing what is called the mucous plug before labor begins. This is a small amount of blood-stained mucus. °· Having regular, painful uterine contractions.   °· The time between contractions gets shorter.   °· The discomfort and pain gradually get more intense. °· Contraction pains get worse when walking and do not go away when resting.   °· Your cervix becomes thinner (effacement) and dilates. °BEFORE THE DELIVERY °Once you are in labor and admitted into the hospital or care center, your health care provider may do the following:  °5. Perform a complete physical exam. °6. Review any complications related to pregnancy or labor.  °7. Check your blood pressure, pulse, temperature, and heart rate (vital signs).   °8. Determine if, and when, the rupture of amniotic membranes occurred. °9. Do a vaginal exam (using a sterile glove and lubricant) to determine:   °1. The position (presentation) of the baby. Is the baby's head presenting first (vertex) in the birth canal (vagina), or are the feet or buttocks first (breech)?   °2. The level (station) of the baby's head within   the birth canal.   °3. The effacement and dilatation of the cervix.   °10. An electronic fetal monitor is usually placed on your abdomen when you first arrive. This is used to monitor your contractions and the baby's heart rate. °1. When the monitor is on your abdomen (external fetal monitor), it can only pick up the frequency and length of your contractions. It cannot tell the strength of your contractions. °2. If it becomes necessary for your health care provider to know exactly how strong your contractions are or to see exactly what the baby's heart rate is doing, an internal monitor may be inserted into your vagina and uterus. Your health care provider will discuss the benefits and risks of using an internal monitor and obtain your permission before inserting the device. °3. Continuous fetal monitoring may be  needed if you have an epidural, are receiving certain medicines (such as oxytocin), or have pregnancy or labor complications. °11. An IV access tube may be placed into a vein in your arm to deliver fluids and medicines if necessary. °THREE STAGES OF LABOR AND DELIVERY °Normal labor and delivery is divided into three stages. °First Stage °This stage starts when you begin to contract regularly and your cervix begins to efface and dilate. It ends when your cervix is completely open (fully dilated). The first stage is the longest stage of labor and can last from 3 hours to 15 hours.  °Several methods are available to help with labor pain. You and your health care provider will decide which option is best for you. Options include:  °· Opioid medicines. These are strong pain medicines that you can get through your IV tube or as a shot into your muscle. These medicines lessen pain but do not make it go away completely.  °· Epidural. A medicine is given through a thin tube that is inserted in your back. The medicine numbs the lower part of your body and prevents any pain in that area. °· Paracervical pain medicine. This is an injection of an anesthetic on each side of your cervix.   °· You may request natural childbirth, which does not involve the use of pain medicines or an epidural during labor and delivery. Instead, you will use other things, such as breathing exercises, to help cope with the pain. °Second Stage °The second stage of labor begins when your cervix is fully dilated at 10 cm. It continues until you push your baby down through the birth canal and the baby is born. This stage can take only minutes or several hours. °· The location of your baby's head as it moves through the birth canal is reported as a number called a station. If the baby's head has not started its descent, the station is described as being at minus 3 (-3). When your baby's head is at the zero station, it is at the middle of the birth canal  and is engaged in the pelvis. The station of your baby helps indicate the progress of the second stage of labor. °· When your baby is born, your health care provider may hold the baby with his or her head lowered to prevent amniotic fluid, mucus, and blood from getting into the baby's lungs. The baby's mouth and nose may be suctioned with a small bulb syringe to remove any additional fluid. °· Your health care provider may then place the baby on your stomach. It is important to keep the baby from getting cold. To do this, the health care provider will dry   the baby off, place the baby directly on your skin (with no blankets between you and the baby), and cover the baby with warm, dry blankets.   °· The umbilical cord is cut. °Third Stage °During the third stage of labor, your health care provider will deliver the placenta (afterbirth) and make sure your bleeding is under control. The delivery of the placenta usually takes about 5 minutes but can take up to 30 minutes. After the placenta is delivered, a medicine may be given either by IV or injection to help contract the uterus and control bleeding. If you are planning to breastfeed, you can try to do so now. °After you deliver the placenta, your uterus should contract and get very firm. If your uterus does not remain firm, your health care provider will massage it. This is important because the contraction of the uterus helps cut off bleeding at the site where the placenta was attached to your uterus. If your uterus does not contract properly and stay firm, you may continue to bleed heavily. If there is a lot of bleeding, medicines may be given to contract the uterus and stop the bleeding.  °  °This information is not intended to replace advice given to you by your health care provider. Make sure you discuss any questions you have with your health care provider. °  °Document Released: 07/04/2008 Document Revised: 10/16/2014 Document Reviewed: 05/22/2012 °Elsevier  Interactive Patient Education ©2016 Elsevier Inc. ° °

## 2016-02-19 NOTE — MAU Note (Signed)
Having ctxs since about 1800. Have not slept all night. Having pelvic pressure. Denies bleeding or LOF. 3.5cm last sve

## 2016-02-19 NOTE — Progress Notes (Signed)
Dr Alvester MorinNEwton notified of cervical reck and serial B/Ps. Will obs for another hour and reck cervix

## 2016-02-19 NOTE — Discharge Instructions (Signed)
Call the clinic [phone]) or go to [facility] if:  You begin to have strong, frequent contractions  Your water breaks.  Sometimes it is a big gush of fluid, sometimes it is just a trickle that keeps getting your panties wet or running down your legs  You have vaginal bleeding.  It is normal to have a small amount of spotting if your cervix was checked.   You don't feel your baby moving like normal.  If you don't, get you something to eat and drink and lay down and focus on feeling your baby move.  You should feel at least 10 movements in 2 hours.  If you don't, you should call the office or go to [facility].    [facility] Contractions Contractions of the uterus can occur throughout pregnancy. Contractions are not always a sign that you are in labor.  WHAT ARE BRAXTON HICKS CONTRACTIONS?  Contractions that occur before labor are called Braxton Hicks contractions, or false labor. Toward the end of pregnancy (32-34 weeks), these contractions can develop more often and may become more forceful. This is not true labor because these contractions do not result in opening (dilatation) and thinning of the cervix. They are sometimes difficult to tell apart from true labor because these contractions can be forceful and people have different pain tolerances. You should not feel embarrassed if you go to the hospital with false labor. Sometimes, the only way to tell if you are in true labor is for your health care provider to look for changes in the cervix. If there are no prenatal problems or other health problems associated with the pregnancy, it is completely safe to be sent home with false labor and await the onset of true labor. HOW CAN YOU TELL THE DIFFERENCE BETWEEN TRUE AND FALSE LABOR? False Labor  The contractions of false labor are usually shorter and not as hard as those of true labor.   The contractions are usually irregular.   The contractions are often felt in the front  of the lower abdomen and in the groin.   The contractions may go away when you walk around or change positions while lying down.   The contractions get weaker and are shorter lasting as time goes on.   The contractions do not usually become progressively stronger, regular, and closer together as with true labor.  True Labor  Contractions in true labor last 30-70 seconds, become very regular, usually become more intense, and increase in frequency.   The contractions do not go away with walking.   The discomfort is usually felt in the top of the uterus and spreads to the lower abdomen and low back.   True labor can be determined by your health care provider with an exam. This will show that the cervix is dilating and getting thinner.  WHAT TO REMEMBER  Keep up with your usual exercises and follow other instructions given by your health care provider.   Take medicines as directed by your health care provider.   Keep your regular prenatal appointments.   Eat and drink lightly if you think you are going into labor.   If Braxton Hicks contractions are making you uncomfortable:   Change your position from lying down or resting to walking, or from walking to resting.   Sit and rest in a tub of warm water.   Drink 2-3 glasses of water. Dehydration may cause these contractions.   Do slow and deep breathing several times an hour.  WHEN SHOULD I SEEK IMMEDIATE MEDICAL CARE? Seek immediate medical care if:  Your contractions become stronger, more regular, and closer together.   You have fluid leaking or gushing from your vagina.   You have a fever.   You pass blood-tinged mucus.   You have vaginal bleeding.   You have continuous abdominal pain.   You have low back pain that you never had before.   You feel your baby's head pushing down and causing pelvic pressure.   Your baby is not moving as much as it used to.    This information is not intended  to replace advice given to you by your health care provider. Make sure you discuss any questions you have with your health care provider.   Document Released: 09/25/2005 Document Revised: 09/30/2013 Document Reviewed: 07/07/2013 Elsevier Interactive Patient Education [employer]2016 Elsevier Inc.
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
## 2016-02-19 NOTE — Progress Notes (Signed)
Notified of pt arrival in MAU and exam with advanced dilation and GBS+. Will put in orders to admit to labor and delivery and ampicillin orders

## 2016-02-20 ENCOUNTER — Encounter (HOSPITAL_COMMUNITY): Payer: Self-pay

## 2016-02-20 LAB — RPR: RPR: NONREACTIVE

## 2016-02-20 LAB — ABO/RH: ABO/RH(D): B POS

## 2016-02-20 LAB — HIV ANTIBODY (ROUTINE TESTING W REFLEX): HIV Screen 4th Generation wRfx: NONREACTIVE

## 2016-02-20 MED ORDER — FLEET ENEMA 7-19 GM/118ML RE ENEM: 1.0000 | ENEMA | Freq: Every day | RECTAL | Status: DC | PRN

## 2016-02-20 MED ORDER — SODIUM CHLORIDE 0.9% FLUSH
3.0000 mL | Freq: Two times a day (BID) | INTRAVENOUS | Status: DC
  Administered 2016-02-20: 3 mL via INTRAVENOUS

## 2016-02-20 MED ORDER — SIMETHICONE 80 MG PO CHEW: 80.0000 mg | CHEWABLE_TABLET | ORAL | Status: DC | PRN

## 2016-02-20 MED ORDER — BISACODYL 10 MG RE SUPP: 10.0000 mg | Freq: Every day | RECTAL | Status: DC | PRN

## 2016-02-20 MED ORDER — TETANUS-DIPHTH-ACELL PERTUSSIS 5-2.5-18.5 LF-MCG/0.5 IM SUSP: 0.5000 mL | Freq: Once | INTRAMUSCULAR | Status: DC

## 2016-02-20 MED ORDER — SODIUM CHLORIDE 0.9 % IV SOLN: 250.0000 mL | INTRAVENOUS | Status: DC | PRN

## 2016-02-20 MED ORDER — BENZOCAINE-MENTHOL 20-0.5 % EX AERO
1.0000 "application " | INHALATION_SPRAY | CUTANEOUS | Status: DC | PRN
  Administered 2016-02-20: 1 via TOPICAL
  Filled 2016-02-20: qty 56

## 2016-02-20 MED ORDER — IBUPROFEN 600 MG PO TABS
600.0000 mg | ORAL_TABLET | Freq: Four times a day (QID) | ORAL | Status: DC
  Administered 2016-02-20 – 2016-02-21 (×7): 600 mg via ORAL
  Filled 2016-02-20 (×7): qty 1

## 2016-02-20 MED ORDER — SODIUM CHLORIDE 0.9% FLUSH: 3.0000 mL | INTRAVENOUS | Status: DC | PRN

## 2016-02-20 MED ORDER — DIPHENHYDRAMINE HCL 25 MG PO CAPS: 25.0000 mg | ORAL_CAPSULE | Freq: Four times a day (QID) | ORAL | Status: DC | PRN

## 2016-02-20 MED ORDER — ACETAMINOPHEN 325 MG PO TABS
650.0000 mg | ORAL_TABLET | ORAL | Status: DC | PRN
  Administered 2016-02-20 (×2): 650 mg via ORAL
  Filled 2016-02-20 (×2): qty 2

## 2016-02-20 MED ORDER — WITCH HAZEL-GLYCERIN EX PADS: 1.0000 "application " | MEDICATED_PAD | CUTANEOUS | Status: DC | PRN

## 2016-02-20 MED ORDER — SENNOSIDES-DOCUSATE SODIUM 8.6-50 MG PO TABS
2.0000 | ORAL_TABLET | ORAL | Status: DC
  Administered 2016-02-21: 2 via ORAL
  Filled 2016-02-20: qty 2

## 2016-02-20 MED ORDER — MEASLES, MUMPS & RUBELLA VAC ~~LOC~~ INJ
0.5000 mL | INJECTION | Freq: Once | SUBCUTANEOUS | Status: DC
  Filled 2016-02-20: qty 0.5

## 2016-02-20 MED ORDER — COCONUT OIL OIL: 1.0000 "application " | TOPICAL_OIL | Status: DC | PRN

## 2016-02-20 MED ORDER — ONDANSETRON HCL 4 MG PO TABS: 4.0000 mg | ORAL_TABLET | ORAL | Status: DC | PRN

## 2016-02-20 MED ORDER — PRENATAL MULTIVITAMIN CH
1.0000 | ORAL_TABLET | Freq: Every day | ORAL | Status: DC
  Administered 2016-02-20 – 2016-02-21 (×2): 1 via ORAL
  Filled 2016-02-20 (×2): qty 1

## 2016-02-20 MED ORDER — DIBUCAINE 1 % RE OINT: 1.0000 "application " | TOPICAL_OINTMENT | RECTAL | Status: DC | PRN

## 2016-02-20 MED ORDER — ONDANSETRON HCL 4 MG/2ML IJ SOLN: 4.0000 mg | INTRAMUSCULAR | Status: DC | PRN

## 2016-02-20 MED ORDER — ZOLPIDEM TARTRATE 5 MG PO TABS: 5.0000 mg | ORAL_TABLET | Freq: Every evening | ORAL | Status: DC | PRN

## 2016-02-20 NOTE — Lactation Note (Signed)
This note was copied from a baby's chart. Lactation Consultation Note  Patient Name: Sharon Chandler NWGNF'AToday's Date: 02/20/2016 Reason for consult: Initial assessment   With this mom and term baby, now 3415 hours old. Mom reports breast feeding going well. The baby latches easily, with strong suckles and visible swallows. Mom was taught hand expression, and return demonstrated with good technique. She has lots of easily expressed colostrum. Basic teaching done form the Baby and Me book, and lactation services reviewed. Mom knows to call for questions/concerns.  Mom has both nipples pierced with a gld bar. She was replacing them after breast feeding. I advised her to leave them out while baby breastfeed, to avoid infection.    Maternal Data Formula Feeding for Exclusion: No Has patient been taught Hand Expression?: Yes Does the patient have breastfeeding experience prior to this delivery?: Yes  Feeding Feeding Type: Breast Fed Length of feed: 12 min  LATCH Score/Interventions Latch: Grasps breast easily, tongue down, lips flanged, rhythmical sucking. Intervention(s): Skin to skin;Teach feeding cues;Waking techniques  Audible Swallowing: A few with stimulation Intervention(s): Skin to skin;Hand expression  Type of Nipple: Everted at rest and after stimulation  Comfort (Breast/Nipple): Soft / non-tender     Hold (Positioning): Assistance needed to correctly position infant at breast and maintain latch. Intervention(s): Breastfeeding basics reviewed;Support Pillows;Position options;Skin to skin  LATCH Score: 8  Lactation Tools Discussed/Used     Consult Status Consult Status: Follow-up Date: 02/21/16 Follow-up type: In-patient    Alfred LevinsLee, Issiac Jamar Anne 02/20/2016, 2:49 PM

## 2016-02-20 NOTE — Progress Notes (Signed)
Post Partum Day 1 Subjective: Eating, drinking, voiding, ambulating well.  +flatus.  Lochia and pain wnl.  Denies dizziness, lightheadedness, or sob. No complaints.   Objective: Blood pressure 119/69, pulse 60, temperature 97.9 F (36.6 C), temperature source Oral, resp. rate 18, height 5\' 5"  (1.651 m), weight 71.668 kg (158 lb), last menstrual period 05/06/2015, SpO2 95 %, unknown if currently breastfeeding.  Physical Exam:  General: alert, cooperative and no distress Lochia: appropriate Uterine Fundus: firm Incision: n/a DVT Evaluation: No evidence of DVT seen on physical exam. Negative Homan's sign. No cords or calf tenderness. No significant calf/ankle edema.   Recent Labs  02/19/16 2300  HGB 11.7*  HCT 32.8*    Assessment/Plan: Plan for discharge tomorrow, Breastfeeding and Contraception IUD, OP circ  GBS+ w/o treatment d/t rapid labor   LOS: 1 day   Marge DuncansBooker, Kimberly Randall 02/20/2016, 6:52 AM

## 2016-02-21 MED ORDER — IBUPROFEN 600 MG PO TABS
600.0000 mg | ORAL_TABLET | Freq: Four times a day (QID) | ORAL | Status: DC
Start: 2016-02-21 — End: 2016-04-04

## 2016-02-21 NOTE — Lactation Note (Signed)
This note was copied from a baby's chart. Lactation Consultation Note  Patient Name: Sharon Chandler WJXBJ'YToday's Date: 02/21/2016 Reason for consult: Follow-up assessment  Baby is 34 hours old , 4% weight loss. @ 24 hours - Bili 3.5  Baby awake and hungry after Dr. Francia GreavesExam. Mom independent to latch , LC noted the baby to on and off @ 1st.  LC assisted mom with depth and molding her tissue in the baby's mouth until  Depth achieved baby fed 10 mins with multiply swallows, increased with breast compressions.  Per mom comfortable. Nipple well rounded when baby released. Small void and medium stool.  Mom denies sore nipples. Sore nipples and engorgement prevention and tx reviewed.  Mom given a hand pump with instructions.  Mother informed of post-discharge support and given phone number to the lactation department, including  services for phone call assistance; out-patient appointments; and breastfeeding support group. List of other  breastfeeding resources in the community given in the handout. Encouraged mother to call for problems or  concerns related to breastfeeding.  Maternal Data Has patient been taught Hand Expression?: Yes  Feeding Feeding Type: Breast Fed Length of feed: 10 min  LATCH Score/Interventions Latch: Repeated attempts needed to sustain latch, nipple held in mouth throughout feeding, stimulation needed to elicit sucking reflex. Intervention(s): Skin to skin;Teach feeding cues;Waking techniques Intervention(s): Adjust position;Assist with latch;Breast massage;Breast compression  Audible Swallowing: Spontaneous and intermittent  Type of Nipple: Everted at rest and after stimulation  Comfort (Breast/Nipple): Soft / non-tender     Hold (Positioning): Assistance needed to correctly position infant at breast and maintain latch. Intervention(s): Breastfeeding basics reviewed;Support Pillows;Position options;Skin to skin  LATCH Score: 8  Lactation Tools  Discussed/Used Tools: Pump Breast pump type: Manual WIC Program: Yes (per mom [facility] )   Consult Status Consult Status: Follow-up Date: 02/22/16 Follow-up type: In-patient    Kathrin Greathouseorio, Loucille Takach Ann 02/21/2016, 9:58 AM

## 2016-02-21 NOTE — Discharge Summary (Signed)
OB Discharge Summary  Patient Name: Sharon Chandler DOB: 11/04/1990 MRN: [id number]  Date of admission: 02/19/2016 Delivering MD: Shawna ClampBOOKER, KIMBERLY R   Date of discharge: 02/21/2016  Admitting diagnosis: 37 WKS, CTXS Intrauterine pregnancy: 6343w1d     Secondary diagnosis:Active Problems:   Active labor at term  Additional problems:none     Discharge diagnosis: Term Pregnancy Delivered                                                                     Post partum procedures:none  Augmentation: none  Complications: None  Hospital course:  Onset of Labor With Vaginal Delivery     25 y.o. yo [id number] at 3343w1d was admitted in Active Labor on 02/19/2016. Patient had an uncomplicated labor course as follows:  Membrane Rupture Time/Date: 11:32 PM ,02/19/2016   Intrapartum Procedures: Episiotomy: None [1]                                         Lacerations:  None [1]  Patient had a delivery of a Viable infant. 02/19/2016  Information for the patient's newborn:  Lidia CollumVermillion, Boy Yasemin [[id number]][[id number]]  Delivery Method: Vaginal, Spontaneous Delivery (Filed from Delivery Summary)    Pateint had an uncomplicated postpartum course.  She is ambulating, tolerating a regular diet, passing flatus, and urinating well. Patient is discharged home in stable condition on 02/21/2016.    Physical exam  Filed Vitals:   02/20/16 0700 02/20/16 1547 02/20/16 1846 02/21/16 0614  BP: 124/78 125/67 118/80 121/77  Pulse: 69 79 70 67  Temp: 97.9 F (36.6 C) 97.7 F (36.5 C) 97.5 F (36.4 C) 97.6 F (36.4 C)  TempSrc: Oral Oral Oral Oral  Resp: 18 17 16 18   Height:      Weight:      SpO2: 100%  99% 100%   General: alert, cooperative and no distress Lochia: appropriate Uterine Fundus: firm Incision: N/A DVT Evaluation: No evidence of DVT seen on physical exam. No cords or calf tenderness. No significant calf/ankle edema. Labs: Lab Results  Component Value Date   WBC 12.4* 02/19/2016   HGB 11.7* 02/19/2016   HCT 32.8* 02/19/2016   MCV 86.8 02/19/2016   PLT 249 02/19/2016   CMP Latest Ref Rng 06/28/2015  Glucose 65 - 99 mg/dL 696(E104(H)  BUN 6 - 20 mg/dL 11  Creatinine 9.520.44 - 8.411.00 mg/dL 3.240.74  Sodium 401135 - 027145 mmol/L 138  Potassium 3.5 - 5.1 mmol/L 4.0  Chloride 101 - 111 mmol/L 106  CO2 22 - 32 mmol/L 25  Calcium 8.9 - 10.3 mg/dL 9.0  Total Protein 6.5 - 8.1 g/dL 7.2  Total Bilirubin 0.3 - 1.2 mg/dL 0.4  Alkaline Phos 38 - 126 U/L 48  AST 15 - 41 U/L 20  ALT 14 - 54 U/L 15    Discharge instruction: per After Visit Summary and "Baby and Me Booklet".  After Visit Meds:    Medication List    ASK your doctor about these medications        CONCEPT OB 130-92.4-1 MG Caps  Take 1 capsule by mouth daily.  Diet: routine diet  Activity: Advance as tolerated. Pelvic rest for 6 weeks.   Outpatient follow up:6 weeks Follow up Appt:Future Appointments Date Time Provider Department Center  02/23/2016 9:15 AM Willodean Rosenthal, MD [facility]   Follow up visit: No Follow-up on file.  Postpartum contraception: IUD Mirena  Newborn Data: Live born female  Birth Weight: 6 lb 2.6 oz (2795 g) APGAR: 9, 9  Baby Feeding: Breast Disposition:home with mother   02/21/2016 Wyvonnia Dusky, CNM

## 2016-02-22 ENCOUNTER — Ambulatory Visit: Payer: Self-pay

## 2016-02-22 NOTE — Lactation Note (Signed)
This note was copied from a baby's chart. Lactation Consultation Note  Patient Name: [name] AOZHY'QToday's Date: 02/22/2016 Reason for consult: Follow-up assessment;Other (Comment);Infant weight loss (5% weight loss, < 6 pounds , Bili at 50 hours - 5.9 )  Baby is 5457 hours old and has been consistent at the breast and exclusively breast. LC praised mom for her efforts and exclusivity.  LC reviewed doc flow sheets with parents , WNL for D/C.  Per mom breast fuller, nipples alittle sensitive. LC reviewed the importance if the 1st breast is full, may need to express 10 -15 ml off so the baby  Can obtain a deep latch. LC stressed the importance of always softening the 1st breast well prior to latching the baby on the 2nd breast so the baby consistently  Receives the creamy fatty milk. Discussed growth spurts , and cluster feeding. Mother informed of post-discharge support and given phone number to the lactation department, including services for phone call assistance; out-patient appointments; and breastfeeding support group. List of other breastfeeding resources in the community given in the handout. Encouraged mother to call for problems or concerns related to breastfeeding.   Maternal Data    Feeding Feeding Type:  (per mom baby recently breast fed ) Length of feed: 15 min (per mom )  LATCH Score/Interventions                Intervention(s): Breastfeeding basics reviewed     Lactation Tools Discussed/Used Tools: Pump Breast pump type: Manual   Consult Status Consult Status: Complete Date: 02/22/16    Sharon Chandler, Sharon Chandler 02/22/2016, 9:00 AM

## 2016-02-23 ENCOUNTER — Encounter: Payer: Medicaid Other | Admitting: Obstetrics & Gynecology

## 2016-04-04 ENCOUNTER — Ambulatory Visit (INDEPENDENT_AMBULATORY_CARE_PROVIDER_SITE_OTHER): Payer: Medicaid Other | Admitting: Obstetrics & Gynecology

## 2016-04-04 ENCOUNTER — Encounter: Payer: Self-pay | Admitting: Obstetrics & Gynecology

## 2016-04-04 VITALS — BP 111/72 | HR 54 | Resp 16 | Ht 65.0 in | Wt 136.0 lb

## 2016-04-04 DIAGNOSIS — F53 Postpartum depression: Secondary | ICD-10-CM

## 2016-04-04 DIAGNOSIS — Z30011 Encounter for initial prescription of contraceptive pills: Secondary | ICD-10-CM

## 2016-04-04 DIAGNOSIS — Z3040 Encounter for surveillance of contraceptives, unspecified: Secondary | ICD-10-CM

## 2016-04-04 DIAGNOSIS — Z8659 Personal history of other mental and behavioral disorders: Secondary | ICD-10-CM | POA: Insufficient documentation

## 2016-04-04 DIAGNOSIS — O99345 Other mental disorders complicating the puerperium: Principal | ICD-10-CM

## 2016-04-04 DIAGNOSIS — Z8759 Personal history of other complications of pregnancy, childbirth and the puerperium: Secondary | ICD-10-CM

## 2016-04-04 MED ORDER — SERTRALINE HCL 100 MG PO TABS: 100.0000 mg | ORAL_TABLET | Freq: Every day | ORAL | Status: DC

## 2016-04-04 MED ORDER — NORETHINDRONE 0.35 MG PO TABS: 1.0000 | ORAL_TABLET | Freq: Every day | ORAL | Status: DC

## 2016-04-04 NOTE — Progress Notes (Signed)
Patient ID: Sharon Chandler, female   DOB: 12/26/1990, 25 y.o.   MRN: [id number] Post Partum Exam  Sharon HarvestBreanna A Chandler is a 25 y.o. 312P2002 female who presents for a postpartum visit. She is 6 weeks postpartum following a spontaneous vaginal delivery. I have fully reviewed the prenatal and intrapartum course. The delivery was at 6983w1d gestational weeks.  Anesthesia: none. Postpartum course has been unremarkable. Baby's course has been unremarkable. Baby is feeding by breast. Bleeding moderate lochia. Bowel function is abnormal: Constipation. Bladder function is normal. Patient is sexually active. Contraception method is none. Postpartum depression screening: positive = Score 20. Denies SI/HI. Just feels very overwhelmed and depressed, desires referral to a mental health provider and also requests some medication.  The following portions of the patient's history were reviewed and updated as appropriate: allergies, current medications, past family history, past medical history, past social history, past surgical history and problem list. Normal pap 07/2015.  Review of Systems Pertinent items are noted in HPI.   Objective:   BP 116/78 mmHg  Pulse 78  Resp 16  Ht 5\' 5"  (1.651 m)  Wt 211 lb (95.709 kg)  BMI 35.11 kg/m2  Breastfeeding? Yes  General:  alert and no distress   Breasts:  inspection negative, no nipple discharge or bleeding, no masses or nodularity palpable  Lungs: clear to auscultation bilaterally  Heart:  regular rate and rhythm  Abdomen: soft, non-tender; bowel sounds normal; no masses,  no organomegaly   Pelvic:  not evaluated        Assessment:   Postpartum exam remarkable for postpartum depression.  Plan:   1. Postpartum depression Counseled patient at length about concerning symptoms, when to present to ED. SI/HI precautions especially emphasized.  Will refer to [facility], Zoloft prescribed. Follow up in one month.  - Ambulatory referral to Psychiatry - sertraline  (ZOLOFT) 100 MG tablet; Take 1 tablet (100 mg total) by mouth daily.  Dispense: 30 tablet; Refill: 5  2. Contraception Counseled about progestin options, she desires pills. Encouraged to take at same time everyday. - norethindrone (MICRONOR,CAMILA,ERRIN) 0.35 MG tablet; Take 1 tablet (0.35 mg total) by mouth daily.  Dispense: 1 Package; Refill: 11  Jaynie CollinsUGONNA  ANYANWU, MD, FACOG Attending Obstetrician & Gynecologist, [facility]

## 2016-04-05 ENCOUNTER — Encounter (INDEPENDENT_AMBULATORY_CARE_PROVIDER_SITE_OTHER): Payer: Self-pay | Admitting: *Deleted

## 2016-04-05 NOTE — Progress Notes (Signed)
Pt dropped of FMLA forms to be completed for postpartum period.

## 2016-05-08 ENCOUNTER — Ambulatory Visit: Payer: Medicaid Other | Admitting: Obstetrics & Gynecology

## 2016-05-15 ENCOUNTER — Encounter: Payer: Self-pay | Admitting: Obstetrics & Gynecology

## 2016-05-15 ENCOUNTER — Ambulatory Visit (INDEPENDENT_AMBULATORY_CARE_PROVIDER_SITE_OTHER): Payer: Medicaid Other | Admitting: Obstetrics & Gynecology

## 2016-05-15 DIAGNOSIS — O99345 Other mental disorders complicating the puerperium: Principal | ICD-10-CM

## 2016-05-15 DIAGNOSIS — F53 Postpartum depression: Secondary | ICD-10-CM
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
## 2016-05-15 NOTE — Patient Instructions (Signed)
Return to clinic for any scheduled appointments or for any gynecologic concerns as needed.   

## 2016-08-10 ENCOUNTER — Other Ambulatory Visit (INDEPENDENT_AMBULATORY_CARE_PROVIDER_SITE_OTHER): Payer: Medicaid Other

## 2016-08-10 VITALS — BP 110/70 | HR 72 | Ht 65.0 in | Wt 136.0 lb

## 2016-08-10 DIAGNOSIS — N912 Amenorrhea, unspecified: Secondary | ICD-10-CM | POA: Diagnosis not present

## 2016-08-10 DIAGNOSIS — F53 Postpartum depression: Secondary | ICD-10-CM

## 2016-08-10 DIAGNOSIS — O99345 Other mental disorders complicating the puerperium: Secondary | ICD-10-CM

## 2016-08-10 DIAGNOSIS — N946 Dysmenorrhea, unspecified: Secondary | ICD-10-CM | POA: Diagnosis not present

## 2016-08-10 LAB — POCT URINALYSIS DIPSTICK
Bilirubin, UA: NEGATIVE
GLUCOSE UA: NEGATIVE
Ketones, UA: NEGATIVE
NITRITE UA: NEGATIVE
Spec Grav, UA: 1.01
UROBILINOGEN UA: 0.2
pH, UA: 6

## 2016-08-10 LAB — POCT URINE PREGNANCY: PREG TEST UR: NEGATIVE

## 2016-08-10 MED ORDER — SERTRALINE HCL 100 MG PO TABS: 100.0000 mg | ORAL_TABLET | Freq: Every day | ORAL | 0 refills | Status: DC

## 2016-08-10 MED ORDER — NITROFURANTOIN MONOHYD MACRO 100 MG PO CAPS: 100.0000 mg | ORAL_CAPSULE | Freq: Two times a day (BID) | ORAL | 1 refills | Status: DC

## 2016-08-10 MED ORDER — FLAVOXATE HCL 100 MG PO TABS: 100.0000 mg | ORAL_TABLET | Freq: Three times a day (TID) | ORAL | 1 refills | Status: DC | PRN

## 2016-08-10 NOTE — Progress Notes (Signed)
SUBJECTIVE: Bess HarvestBreanna A Schaff is a 25 y.o. female who complains of urinary frequency, urgency and dysuria x 7 days, without flank pain, fever, chills, or abnormal vaginal discharge or bleeding.  She expresses that she has missed a few OCPs and has not had a cycle. She noticed some brownish colored vaginal discharge once a few days ago but has not had a cycle. She would like to try [employer] for contraception. She ran out of Zoloft but feels as though she still needs it.  OBJECTIVE: Appears well, in no apparent distress.  Vital signs are normal. Urine dipstick shows positive for WBC's and positive for RBC's. UPT is negative  ASSESSMENT: Dysuria. Amenorrhea. Depression. Need for Contraception alternative.  PLAN:  Abx sent to pharmacy per verbal order, also push fluids, and may use Urispas prn for bladder spasms.   UPT negative. Nuva Ring Sample given for her to try for 1 month until she returns at next appointment.  Refilled Zoloft for 1 month until she returns for follow up.  Call or return to clinic prn if these symptoms worsen or fail to improve as anticipated.

## 2016-09-04 ENCOUNTER — Ambulatory Visit: Payer: Medicaid Other | Admitting: Obstetrics & Gynecology

## 2017-01-08 ENCOUNTER — Encounter: Payer: Self-pay | Admitting: *Deleted

## 2017-01-08 ENCOUNTER — Telehealth: Payer: Self-pay | Admitting: *Deleted

## 2017-01-08 DIAGNOSIS — F17200 Nicotine dependence, unspecified, uncomplicated: Secondary | ICD-10-CM

## 2017-01-08 NOTE — Telephone Encounter (Signed)
-----   Message from Lindell Spar, [place] sent at 01/08/2017  8:24 AM EDT ----- Regarding: Rx request Contact: [phone] Rx request for patches sent [facility] on [address]

## 2017-01-09 MED ORDER — NICOTINE 21 MG/24HR TD PT24: 21.0000 mg | MEDICATED_PATCH | Freq: Every day | TRANSDERMAL | 4 refills | Status: DC

## 2017-01-09 NOTE — Telephone Encounter (Signed)
Nicoderm patch sent to pharmacy per Dr Macon Large order.

## 2017-01-15 ENCOUNTER — Other Ambulatory Visit: Payer: Medicaid Other | Admitting: *Deleted

## 2017-01-15 DIAGNOSIS — R300 Dysuria: Principal | ICD-10-CM

## 2017-01-15 DIAGNOSIS — O219 Vomiting of pregnancy, unspecified: Secondary | ICD-10-CM

## 2017-01-15 DIAGNOSIS — Z3480 Encounter for supervision of other normal pregnancy, unspecified trimester: Secondary | ICD-10-CM | POA: Insufficient documentation

## 2017-01-15 DIAGNOSIS — O09899 Supervision of other high risk pregnancies, unspecified trimester: Secondary | ICD-10-CM | POA: Insufficient documentation

## 2017-01-15 DIAGNOSIS — O26891 Other specified pregnancy related conditions, first trimester: Secondary | ICD-10-CM

## 2017-01-15 MED ORDER — CEPHALEXIN 500 MG PO CAPS: 500.0000 mg | ORAL_CAPSULE | Freq: Four times a day (QID) | ORAL | 0 refills | Status: DC

## 2017-01-15 MED ORDER — PROMETHAZINE HCL 25 MG PO TABS: 25.0000 mg | ORAL_TABLET | Freq: Four times a day (QID) | ORAL | 1 refills | Status: DC | PRN

## 2017-01-15 NOTE — Progress Notes (Signed)
Pt here today c/o dysuria that started this morning.  Unable to perform urinalysis due to pt taking OTC AZO pills.  Will send urine cx.  Pt is approximately [redacted] wks pregnant, Sent Keflex to the pharmacy as well as Phenergan due to pt c/o episodes of N/V as well.  Will call with results. Encouraged to push fluids and empty bladder often.  Pt has New OB visit next Tuesday.

## 2017-01-17 LAB — URINE CULTURE, OB REFLEX

## 2017-01-17 LAB — CULTURE, OB URINE

## 2017-01-19 ENCOUNTER — Other Ambulatory Visit (HOSPITAL_COMMUNITY)
Admission: RE | Admit: 2017-01-19 | Discharge: 2017-01-19 | Disposition: A | Discharge status: Home / Self Care | Payer: Medicaid Other | Source: Ambulatory Visit | Attending: Obstetrics and Gynecology | Admitting: Obstetrics and Gynecology

## 2017-01-19 ENCOUNTER — Other Ambulatory Visit (INDEPENDENT_AMBULATORY_CARE_PROVIDER_SITE_OTHER): Payer: Medicaid Other | Admitting: *Deleted

## 2017-01-19 ENCOUNTER — Encounter: Payer: Self-pay | Admitting: *Deleted

## 2017-01-19 DIAGNOSIS — Z202 Contact with and (suspected) exposure to infections with a predominantly sexual mode of transmission: Secondary | ICD-10-CM | POA: Diagnosis not present

## 2017-01-19 NOTE — Progress Notes (Unsigned)
  SUBJECTIVE:  26 y.o. female here for STD testing due to infidelity. Denies abnormal vaginal bleeding or significant pelvic pain or vaginal discharge. No UTI symptoms. Denies history of known exposure to STD.  Patient's last menstrual period was 11/21/2016. Pt currently [redacted]w[redacted]d pregnant  OBJECTIVE:  She appears afebrile, fatigued   ASSESSMENT:  Possible STI Exposure   PLAN:  GC, chlamydia, trichomonas, BVAG, CVAG probe sent to lab. Blood STI testing today Treatment: To be determined once lab results are received ROV prn if symptoms persist or worsen.

## 2017-01-20 LAB — HEPATITIS B SURFACE ANTIGEN: Hepatitis B Surface Ag: NEGATIVE

## 2017-01-20 LAB — RPR: RPR: NONREACTIVE

## 2017-01-20 LAB — HEPATITIS C ANTIBODY: Hep C Virus Ab: <0.1 s/co ratio (ref 0.0–0.9)

## 2017-01-20 LAB — HIV ANTIBODY (ROUTINE TESTING W REFLEX): HIV SCREEN 4TH GENERATION: NONREACTIVE

## 2017-01-22 LAB — CERVICOVAGINAL ANCILLARY ONLY
BACTERIAL VAGINITIS: NEGATIVE
CANDIDA VAGINITIS: POSITIVE — AB
CHLAMYDIA, DNA PROBE: NEGATIVE
NEISSERIA GONORRHEA: NEGATIVE
TRICH (WINDOWPATH): NEGATIVE

## 2017-01-23 ENCOUNTER — Encounter: Payer: Self-pay | Admitting: Obstetrics & Gynecology

## 2017-01-23 ENCOUNTER — Other Ambulatory Visit (HOSPITAL_COMMUNITY)
Admission: RE | Admit: 2017-01-23 | Discharge: 2017-01-23 | Disposition: A | Discharge status: Home / Self Care | Payer: Medicaid Other | Source: Ambulatory Visit | Attending: Obstetrics & Gynecology | Admitting: Obstetrics & Gynecology

## 2017-01-23 ENCOUNTER — Ambulatory Visit (INDEPENDENT_AMBULATORY_CARE_PROVIDER_SITE_OTHER): Payer: Medicaid Other | Admitting: Obstetrics & Gynecology

## 2017-01-23 ENCOUNTER — Encounter: Payer: Self-pay | Admitting: *Deleted

## 2017-01-23 VITALS — BP 116/72 | HR 80 | Wt 139.0 lb

## 2017-01-23 DIAGNOSIS — O30001 Twin pregnancy, unspecified number of placenta and unspecified number of amniotic sacs, first trimester: Secondary | ICD-10-CM

## 2017-01-23 DIAGNOSIS — Z3689 Encounter for other specified antenatal screening: Secondary | ICD-10-CM | POA: Diagnosis not present

## 2017-01-23 DIAGNOSIS — O0991 Supervision of high risk pregnancy, unspecified, first trimester: Secondary | ICD-10-CM | POA: Insufficient documentation

## 2017-01-23 DIAGNOSIS — O09899 Supervision of other high risk pregnancies, unspecified trimester: Secondary | ICD-10-CM

## 2017-01-23 DIAGNOSIS — O30009 Twin pregnancy, unspecified number of placenta and unspecified number of amniotic sacs, unspecified trimester: Secondary | ICD-10-CM

## 2017-01-23 DIAGNOSIS — O09891 Supervision of other high risk pregnancies, first trimester: Secondary | ICD-10-CM

## 2017-01-23 DIAGNOSIS — Z3A09 9 weeks gestation of pregnancy: Secondary | ICD-10-CM | POA: Insufficient documentation

## 2017-01-23 NOTE — Progress Notes (Signed)
Bedside US shows twin pregnancy. Baby A measuring [redacted]w[redacted]d with FHR 166 and Baby B measuring [redacted]w[redacted]d with FHR 168.
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
## 2017-01-23 NOTE — Patient Instructions (Signed)
Thank you for enrolling in MyChart. Please follow the instructions below to securely access your online medical record. MyChart allows you to send messages to your doctor, view your test results, manage appointments, and more.   How Do I Sign Up? 1. In your Internet browser, go to [employer] and enter [email or web address]. 2. Click on the Sign Up Now link in the Sign In box. You will see the New Member Sign Up page. 3. Enter your MyChart Access Code exactly as it appears below. You will not need to use this code after you've completed the sign-up process. If you do not sign up before the expiration date, you must request a new code.  MyChart Access Code: [email or web address] Expires: 03/24/2017  9:43 AM  4. Enter your Social Security Number ([id number]) and Date of Birth (mm/dd/yyyy) as indicated and click Submit. You will be taken to the next sign-up page. 5. Create a MyChart ID. This will be your MyChart login ID and cannot be changed, so think of one that is secure and easy to remember. 6. Create a MyChart password. You can change your password at any time. 7. Enter your Password Reset Question and Answer. This can be used at a later time if you forget your password.  8. Enter your e-mail address. You will receive e-mail notification when new information is available in MyChart. 9. Click Sign Up. You can now view your medical record.   Additional Information Remember, MyChart is NOT to be used for urgent needs. For medical emergencies, dial 911.   First Trimester of Pregnancy The first trimester of pregnancy is from week 1 until the end of week 13 (months 1 through 3). A week after a sperm fertilizes an egg, the egg will implant on the wall of the uterus. This embryo will begin to develop into a baby. Genes from you and your partner will form the baby. The female genes will determine whether the baby will be a boy or a girl. At 6-8 weeks, the eyes and face will be formed, and the  heartbeat can be seen on ultrasound. At the end of 12 weeks, all the baby's organs will be formed. Now that you are pregnant, you will want to do everything you can to have a healthy baby. Two of the most important things are to get good prenatal care and to follow your health care provider's instructions. Prenatal care is all the medical care you receive before the baby's birth. This care will help prevent, find, and treat any problems during the pregnancy and childbirth. Body changes during your first trimester Your body goes through many changes during pregnancy. The changes vary from woman to woman.  You may gain or lose a couple of pounds at first.  You may feel sick to your stomach (nauseous) and you may throw up (vomit). If the vomiting is uncontrollable, call your health care provider.  You may tire easily.  You may develop headaches that can be relieved by medicines. All medicines should be approved by your health care provider.  You may urinate more often. Painful urination may mean you have a bladder infection.  You may develop heartburn as a result of your pregnancy.  You may develop constipation because certain hormones are causing the muscles that push stool through your intestines to slow down.  You may develop hemorrhoids or swollen veins (varicose veins).  Your breasts may begin to grow larger and become tender. Your nipples may stick out more, and the tissue  that surrounds them (areola) may become darker.  Your gums may bleed and may be sensitive to brushing and flossing.  Dark spots or blotches (chloasma, mask of pregnancy) may develop on your face. This will likely fade after the baby is born.  Your menstrual periods will stop.  You may have a loss of appetite.  You may develop cravings for certain kinds of food.  You may have changes in your emotions from day to day, such as being excited to be pregnant or being concerned that something may go wrong with the  pregnancy and baby.  You may have more vivid and strange dreams.  You may have changes in your hair. These can include thickening of your hair, rapid growth, and changes in texture. Some women also have hair loss during or after pregnancy, or hair that feels dry or thin. Your hair will most likely return to normal after your baby is born. What to expect at prenatal visits During a routine prenatal visit:  You will be weighed to make sure you and the baby are growing normally.  Your blood pressure will be taken.  Your abdomen will be measured to track your baby's growth.  The fetal heartbeat will be listened to between weeks 10 and 14 of your pregnancy.  Test results from any previous visits will be discussed. Your health care provider may ask you:  How you are feeling.  If you are feeling the baby move.  If you have had any abnormal symptoms, such as leaking fluid, bleeding, severe headaches, or abdominal cramping.  If you are using any tobacco products, including cigarettes, chewing tobacco, and electronic cigarettes.  If you have any questions. Other tests that may be performed during your first trimester include:  Blood tests to find your blood type and to check for the presence of any previous infections. The tests will also be used to check for low iron levels (anemia) and protein on red blood cells (Rh antibodies). Depending on your risk factors, or if you previously had diabetes during pregnancy, you may have tests to check for high blood sugar that affects pregnant women (gestational diabetes).  Urine tests to check for infections, diabetes, or protein in the urine.  An ultrasound to confirm the proper growth and development of the baby.  Fetal screens for spinal cord problems (spina bifida) and Down syndrome.  HIV (human immunodeficiency virus) testing. Routine prenatal testing includes screening for HIV, unless you choose not to have this test.  You may need other  tests to make sure you and the baby are doing well. Follow these instructions at home: Medicines   Follow your health care provider's instructions regarding medicine use. Specific medicines may be either safe or unsafe to take during pregnancy.  Take a prenatal vitamin that contains at least 600 micrograms (mcg) of folic acid.  If you develop constipation, try taking a stool softener if your health care provider approves. Eating and drinking   Eat a balanced diet that includes fresh fruits and vegetables, whole grains, good sources of protein such as meat, eggs, or tofu, and low-fat dairy. Your health care provider will help you determine the amount of weight gain that is right for you.  Avoid raw meat and uncooked cheese. These carry germs that can cause birth defects in the baby.  Eating four or five small meals rather than three large meals a day may help relieve nausea and vomiting. If you start to feel nauseous, eating a few soda  crackers can be helpful. Drinking liquids between meals, instead of during meals, also seems to help ease nausea and vomiting.  Limit foods that are high in fat and processed sugars, such as fried and sweet foods.  To prevent constipation:  Eat foods that are high in fiber, such as fresh fruits and vegetables, whole grains, and beans.  Drink enough fluid to keep your urine clear or pale yellow. Activity   Exercise only as directed by your health care provider. Most women can continue their usual exercise routine during pregnancy. Try to exercise for 30 minutes at least 5 days a week. Exercising will help you:  Control your weight.  Stay in shape.  Be prepared for labor and delivery.  Experiencing pain or cramping in the lower abdomen or lower back is a good sign that you should stop exercising. Check with your health care provider before continuing with normal exercises.  Try to avoid standing for long periods of time. Move your legs often if you  must stand in one place for a long time.  Avoid heavy lifting.  Wear low-heeled shoes and practice good posture.  You may continue to have sex unless your health care provider tells you not to. Relieving pain and discomfort   Wear a good support bra to relieve breast tenderness.  Take warm sitz baths to soothe any pain or discomfort caused by hemorrhoids. Use hemorrhoid cream if your health care provider approves.  Rest with your legs elevated if you have leg cramps or low back pain.  If you develop varicose veins in your legs, wear support hose. Elevate your feet for 15 minutes, 3-4 times a day. Limit salt in your diet. Prenatal care   Schedule your prenatal visits by the twelfth week of pregnancy. They are usually scheduled monthly at first, then more often in the last 2 months before delivery.  Write down your questions. Take them to your prenatal visits.  Keep all your prenatal visits as told by your health care provider. This is important. Safety   Wear your seat belt at all times when driving.  Make a list of emergency phone numbers, including numbers for family, friends, the hospital, and police and fire departments. General instructions   Ask your health care provider for a referral to a local prenatal education class. Begin classes no later than the beginning of month 6 of your pregnancy.  Ask for help if you have counseling or nutritional needs during pregnancy. Your health care provider can offer advice or refer you to specialists for help with various needs.  Do not use hot tubs, steam rooms, or saunas.  Do not douche or use tampons or scented sanitary pads.  Do not cross your legs for long periods of time.  Avoid cat litter boxes and soil used by cats. These carry germs that can cause birth defects in the baby and possibly loss of the fetus by miscarriage or stillbirth.  Avoid all smoking, herbs, alcohol, and medicines not prescribed by your health care  provider. Chemicals in these products affect the formation and growth of the baby.  Do not use any products that contain nicotine or tobacco, such as cigarettes and e-cigarettes. If you need help quitting, ask your health care provider. You may receive counseling support and other resources to help you quit.  Schedule a dentist appointment. At home, brush your teeth with a soft toothbrush and be gentle when you floss. Contact a health care provider if:  You have dizziness.  You have mild pelvic cramps, pelvic pressure, or nagging pain in the abdominal area.  You have persistent nausea, vomiting, or diarrhea.  You have a bad smelling vaginal discharge.  You have pain when you urinate.  You notice increased swelling in your face, hands, legs, or ankles.  You are exposed to fifth disease or chickenpox.  You are exposed to [place] measles (rubella) and have never had it. Get help right away if:  You have a fever.  You are leaking fluid from your vagina.  You have spotting or bleeding from your vagina.  You have severe abdominal cramping or pain.  You have rapid weight gain or loss.  You vomit blood or material that looks like coffee grounds.  You develop a severe headache.  You have shortness of breath.  You have any kind of trauma, such as from a fall or a car accident. Summary  The first trimester of pregnancy is from week 1 until the end of week 13 (months 1 through 3).  Your body goes through many changes during pregnancy. The changes vary from woman to woman.  You will have routine prenatal visits. During those visits, your health care provider will examine you, discuss any test results you may have, and talk with you about how you are feeling. This information is not intended to replace advice given to you by your health care provider. Make sure you discuss any questions you have with your health care provider. Document Released: 09/19/2001 Document Revised:  09/06/2016 Document Reviewed: 09/06/2016 Elsevier Interactive Patient Education  2017 [employer].

## 2017-01-25 NOTE — Progress Notes (Signed)
Specimen was sent yesterday

## 2017-01-26 LAB — CYTOLOGY - PAP
Chlamydia: NEGATIVE
Diagnosis: NEGATIVE
HPV (WINDOPATH): NOT DETECTED
NEISSERIA GONORRHEA: NEGATIVE

## 2017-01-28 LAB — URINE CULTURE, OB REFLEX

## 2017-01-28 LAB — CULTURE, OB URINE

## 2017-02-02 LAB — OBSTETRIC PANEL, INCLUDING HIV
Antibody Screen: NEGATIVE
BASOS ABS: 0 10*3/uL (ref 0.0–0.2)
Basos: 0 %
EOS (ABSOLUTE): 0.1 10*3/uL (ref 0.0–0.4)
Eos: 2 %
HEP B S AG: NEGATIVE
HIV Screen 4th Generation wRfx: NONREACTIVE
Hematocrit: 35.7 % (ref 34.0–46.6)
Hemoglobin: 12.1 g/dL (ref 11.1–15.9)
IMMATURE GRANULOCYTES: 0 %
Immature Grans (Abs): 0 10*3/uL (ref 0.0–0.1)
Lymphocytes Absolute: 1.7 10*3/uL (ref 0.7–3.1)
Lymphs: 29 %
MCH: 30 pg (ref 26.6–33.0)
MCHC: 33.9 g/dL (ref 31.5–35.7)
MCV: 89 fL (ref 79–97)
MONOS ABS: 0.5 10*3/uL (ref 0.1–0.9)
Monocytes: 8 %
NEUTROS PCT: 61 %
Neutrophils Absolute: 3.6 10*3/uL (ref 1.4–7.0)
PLATELETS: 261 10*3/uL (ref 150–379)
RBC: 4.03 x10E6/uL (ref 3.77–5.28)
RDW: 13.5 % (ref 12.3–15.4)
RPR Ser Ql: NONREACTIVE
Rh Factor: POSITIVE
Rubella Antibodies, IGG: 3.58 index (ref >0.99)
WBC: 5.9 10*3/uL (ref 3.4–10.8)

## 2017-02-02 LAB — SMN1 COPY NUMBER ANALYSIS (SMA CARRIER SCREENING)

## 2017-02-02 LAB — CYSTIC FIBROSIS MUTATION 97: GENE DIS ANAL CARRIER INTERP BLD/T-IMP: NOT DETECTED

## 2017-02-02 LAB — SPECIMEN STATUS REPORT

## 2017-02-04 ENCOUNTER — Encounter: Payer: Self-pay | Admitting: Emergency Medicine

## 2017-02-04 ENCOUNTER — Emergency Department
Admission: EM | Admit: 2017-02-04 | Discharge: 2017-02-05 | Disposition: A | Discharge status: Home / Self Care | Payer: Medicaid Other | Attending: Emergency Medicine | Admitting: Emergency Medicine

## 2017-02-04 DIAGNOSIS — O30001 Twin pregnancy, unspecified number of placenta and unspecified number of amniotic sacs, first trimester: Secondary | ICD-10-CM

## 2017-02-04 DIAGNOSIS — O219 Vomiting of pregnancy, unspecified: Secondary | ICD-10-CM | POA: Diagnosis present

## 2017-02-04 DIAGNOSIS — F17210 Nicotine dependence, cigarettes, uncomplicated: Secondary | ICD-10-CM | POA: Insufficient documentation

## 2017-02-04 DIAGNOSIS — Z79899 Other long term (current) drug therapy: Secondary | ICD-10-CM | POA: Insufficient documentation

## 2017-02-04 DIAGNOSIS — Z3A11 11 weeks gestation of pregnancy: Secondary | ICD-10-CM | POA: Insufficient documentation

## 2017-02-04 DIAGNOSIS — O99331 Smoking (tobacco) complicating pregnancy, first trimester: Secondary | ICD-10-CM | POA: Diagnosis not present

## 2017-02-04 LAB — CBC
HCT: 36.5 % (ref 35.0–47.0)
Hemoglobin: 13 g/dL (ref 12.0–16.0)
MCH: 31.8 pg (ref 26.0–34.0)
MCHC: 35.5 g/dL (ref 32.0–36.0)
MCV: 89.7 fL (ref 80.0–100.0)
PLATELETS: 249 10*3/uL (ref 150–440)
RBC: 4.07 MIL/uL (ref 3.80–5.20)
RDW: 13 % (ref 11.5–14.5)
WBC: 8.9 10*3/uL (ref 3.6–11.0)

## 2017-02-04 LAB — COMPREHENSIVE METABOLIC PANEL
ALK PHOS: 50 U/L (ref 38–126)
ALT: 10 U/L — AB (ref 14–54)
ANION GAP: 9 (ref 5–15)
AST: 13 U/L — ABNORMAL LOW (ref 15–41)
Albumin: 4.1 g/dL (ref 3.5–5.0)
BUN: 8 mg/dL (ref 6–20)
CO2: 25 mmol/L (ref 22–32)
CREATININE: 0.47 mg/dL (ref 0.44–1.00)
Calcium: 8.7 mg/dL — ABNORMAL LOW (ref 8.9–10.3)
Chloride: 99 mmol/L — ABNORMAL LOW (ref 101–111)
GLUCOSE: 111 mg/dL — AB (ref 65–99)
Potassium: 3.5 mmol/L (ref 3.5–5.1)
SODIUM: 133 mmol/L — AB (ref 135–145)
Total Bilirubin: 0.3 mg/dL (ref 0.3–1.2)
Total Protein: 7.1 g/dL (ref 6.5–8.1)

## 2017-02-04 LAB — URINALYSIS, COMPLETE (UACMP) WITH MICROSCOPIC
BILIRUBIN URINE: NEGATIVE
GLUCOSE, UA: NEGATIVE mg/dL
HGB URINE DIPSTICK: NEGATIVE
Ketones, ur: 5 mg/dL — AB
NITRITE: NEGATIVE
PH: 7 (ref 5.0–8.0)
Protein, ur: NEGATIVE mg/dL
SPECIFIC GRAVITY, URINE: 1.018 (ref 1.005–1.030)

## 2017-02-04 LAB — LIPASE, BLOOD: LIPASE: 28 U/L (ref 11–51)

## 2017-02-04 LAB — PREGNANCY, URINE: PREG TEST UR: POSITIVE — AB

## 2017-02-04 MED ORDER — ONDANSETRON 4 MG PO TBDP
4.0000 mg | ORAL_TABLET | Freq: Once | ORAL | Status: AC
  Administered 2017-02-04: 4 mg via ORAL
  Filled 2017-02-04: qty 1

## 2017-02-04 MED ORDER — SODIUM CHLORIDE 0.9 % IV BOLUS (SEPSIS)
1000.0000 mL | INTRAVENOUS | Status: AC
  Administered 2017-02-04: 1000 mL via INTRAVENOUS
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
## 2017-02-04 NOTE — ED Triage Notes (Signed)
Pt reports that she has been vomiting more than usual this pregnancy. Pt states that she originally was 157lb, now is was 138lb, pt states she is carrying twins, states some rlq pain last pm, pt states that she can't keep anything down, and her body is [employer]

## 2017-02-04 NOTE — ED Notes (Signed)
Pt states is 11 weeks gravid with twins. Pt states she has emesis and "is not able to keep nothin' down". Pt denies diarrhea. Pt states she can feel fetal movement and denies vaginal discharge or bleeding. Pt states last pm she had rlq pain but none currently. Pt with pwd skin, cap refill less than 2 seconds.

## 2017-02-05 ENCOUNTER — Encounter: Payer: Self-pay | Admitting: *Deleted

## 2017-02-05 MED ORDER — ONDANSETRON HCL 4 MG PO TABS: ORAL_TABLET | ORAL | 0 refills | Status: DC

## 2017-02-05 NOTE — ED Notes (Signed)
Pt taken crackers and po fluids by md for po challenge.

## 2017-02-05 NOTE — ED Notes (Signed)
Pt tolerating po fluids and cracker. Pt has approx of ns left to infuse in iv bag.

## 2017-02-05 NOTE — Discharge Instructions (Signed)
Your workup and examination today were reassuring.  Because your current medications are not working, we prescribed you a medication called Zofran which may help with your symptoms.  We encourage you to stick to a bland diet and try to stay hydrated with fluids as much as possible.  Follow up with your prenatal provider at the next available opportunity to discuss additional treatment options.  Return to the emergency department if you develop new or worsening symptoms that concern you.

## 2017-02-06 LAB — URINE CULTURE
Culture: NO GROWTH
SPECIAL REQUESTS: NORMAL

## 2017-02-20 ENCOUNTER — Ambulatory Visit (HOSPITAL_COMMUNITY)
Admission: RE | Admit: 2017-02-20 | Discharge: 2017-02-20 | Disposition: A | Discharge status: Home / Self Care | Payer: Medicaid Other | Source: Ambulatory Visit | Attending: Obstetrics & Gynecology | Admitting: Obstetrics & Gynecology

## 2017-02-20 ENCOUNTER — Encounter (HOSPITAL_COMMUNITY): Payer: Self-pay

## 2017-02-20 DIAGNOSIS — Z3A13 13 weeks gestation of pregnancy: Secondary | ICD-10-CM | POA: Diagnosis not present

## 2017-02-20 DIAGNOSIS — O0992 Supervision of high risk pregnancy, unspecified, second trimester: Secondary | ICD-10-CM | POA: Diagnosis not present

## 2017-02-20 DIAGNOSIS — F17200 Nicotine dependence, unspecified, uncomplicated: Secondary | ICD-10-CM | POA: Insufficient documentation

## 2017-02-20 DIAGNOSIS — O30031 Twin pregnancy, monochorionic/diamniotic, first trimester: Secondary | ICD-10-CM | POA: Diagnosis present

## 2017-02-20 DIAGNOSIS — O30032 Twin pregnancy, monochorionic/diamniotic, second trimester: Secondary | ICD-10-CM | POA: Insufficient documentation

## 2017-02-20 DIAGNOSIS — O09899 Supervision of other high risk pregnancies, unspecified trimester: Secondary | ICD-10-CM

## 2017-02-20 DIAGNOSIS — O99332 Smoking (tobacco) complicating pregnancy, second trimester: Secondary | ICD-10-CM | POA: Insufficient documentation

## 2017-02-20 DIAGNOSIS — O30009 Twin pregnancy, unspecified number of placenta and unspecified number of amniotic sacs, unspecified trimester: Secondary | ICD-10-CM

## 2017-02-20 DIAGNOSIS — O0991 Supervision of high risk pregnancy, unspecified, first trimester: Secondary | ICD-10-CM

## 2017-02-21 ENCOUNTER — Ambulatory Visit (INDEPENDENT_AMBULATORY_CARE_PROVIDER_SITE_OTHER): Payer: Medicaid Other | Admitting: Family Medicine

## 2017-02-21 ENCOUNTER — Encounter: Payer: Self-pay | Admitting: Radiology

## 2017-02-21 ENCOUNTER — Encounter: Payer: Medicaid Other | Admitting: Family Medicine

## 2017-02-21 VITALS — BP 108/66 | HR 93 | Wt 139.0 lb

## 2017-02-21 DIAGNOSIS — O09891 Supervision of other high risk pregnancies, first trimester: Secondary | ICD-10-CM

## 2017-02-21 DIAGNOSIS — O30031 Twin pregnancy, monochorionic/diamniotic, first trimester: Secondary | ICD-10-CM

## 2017-02-21 DIAGNOSIS — F53 Postpartum depression: Secondary | ICD-10-CM

## 2017-02-21 DIAGNOSIS — O30039 Twin pregnancy, monochorionic/diamniotic, unspecified trimester: Secondary | ICD-10-CM | POA: Insufficient documentation

## 2017-02-21 DIAGNOSIS — O0991 Supervision of high risk pregnancy, unspecified, first trimester: Secondary | ICD-10-CM

## 2017-02-21 DIAGNOSIS — O99345 Other mental disorders complicating the puerperium: Secondary | ICD-10-CM

## 2017-02-21 DIAGNOSIS — O09899 Supervision of other high risk pregnancies, unspecified trimester: Secondary | ICD-10-CM

## 2017-02-21 NOTE — Addendum Note (Signed)
Addended by: Geanie BerlinNEWTON, Burley Kopka N on: 02/21/2017 10:18 AM   Modules accepted: Orders

## 2017-02-21 NOTE — Addendum Note (Signed)
Addended by: Geanie BerlinNEWTON, Olly Shiner N on: 02/21/2017 01:32 PM   Modules accepted: Orders

## 2017-02-21 NOTE — Addendum Note (Signed)
Addended by: Geanie BerlinNEWTON, Mardie Kellen N on: 02/21/2017 11:17 AM   Modules accepted: Orders

## 2017-02-21 NOTE — Patient Instructions (Signed)

## 2017-02-21 NOTE — Progress Notes (Addendum)
   PRENATAL VISIT NOTE  Subjective:  Sharon Chandler is a 26 y.o. G3P2002 at 5223w1d being seen today for ongoing prenatal care.  She is currently monitored for the following issues for this high-risk pregnancy and has Supervision of high-risk pregnancy; Postpartum depression; Short interval between pregnancies affecting pregnancy, antepartum; and Twin pregnancy on her problem list.  Patient reports backache.  Contractions: Not present. Vag. Bleeding: None.  Movement: Present. Denies leaking of fluid.   The following portions of the patient's history were reviewed and updated as appropriate: allergies, current medications, past family history, past medical history, past social history, past surgical history and problem list. Problem list updated.  Objective:   Vitals:   02/21/17 0951  BP: 108/66  Pulse: 93  Weight: 139 lb (63 kg)    Fetal Status: Fetal Heart Rate (bpm): 152/148   Movement: Present     General:  Alert, oriented and cooperative. Patient is in no acute distress.  Skin: Skin is warm and dry. No rash noted.   Cardiovascular: Normal heart rate noted  Respiratory: Normal respiratory effort, no problems with respiration noted  Abdomen: Soft, gravid, appropriate for gestational age. Pain/Pressure: Absent     Pelvic:  Cervical exam deferred        Extremities: Normal range of motion.  Edema: None  Mental Status: Normal mood and affect. Normal behavior. Normal judgment and thought content.   Assessment and Plan:  Pregnancy: G3P2002 at 4723w1d  1. Short interval between pregnancies affecting pregnancy, antepartum Last delivery in 2017  2. Postpartum depression Mood stable today. No concerns  3. Supervision of high risk pregnancy in first trimester Reviewed US results and need for AFP at next visit NTx2 wnl.  N/V is resolving at this point Reviewed back pain relief in pregnancy - stretches, heating pads, warm bath, tylenol.   4. Monochorionic diamniotic twin  pregnancy, antepartum Discussed plan for growth US every 2-3 weeks with MFM after 18 weeks Reviewed pathopysiology of TTS Ordered Anatomy US, plan to schedule in 5 weeks  Preterm labor symptoms and general obstetric precautions including but not limited to vaginal bleeding, contractions, leaking of fluid and fetal movement were reviewed in detail with the patient. Please refer to After Visit Summary for other counseling recommendations.  Return in about 4 weeks (around 03/21/2017) for Routine prenatal care.   Federico FlakeNewton, Farin Buhman Niles, MD

## 2017-02-26 ENCOUNTER — Encounter: Payer: Self-pay | Admitting: Obstetrics & Gynecology

## 2017-02-26 ENCOUNTER — Encounter: Payer: Self-pay | Admitting: *Deleted

## 2017-03-02 ENCOUNTER — Other Ambulatory Visit: Payer: Self-pay

## 2017-03-27 ENCOUNTER — Encounter: Payer: Medicaid Other | Admitting: Obstetrics & Gynecology

## 2017-03-27 ENCOUNTER — Encounter: Payer: Self-pay | Admitting: *Deleted

## 2017-03-27 ENCOUNTER — Ambulatory Visit (INDEPENDENT_AMBULATORY_CARE_PROVIDER_SITE_OTHER): Payer: Medicaid Other | Admitting: Obstetrics & Gynecology

## 2017-03-27 VITALS — BP 106/67 | HR 73 | Wt 144.0 lb

## 2017-03-27 DIAGNOSIS — O26892 Other specified pregnancy related conditions, second trimester: Secondary | ICD-10-CM

## 2017-03-27 DIAGNOSIS — R300 Dysuria: Secondary | ICD-10-CM

## 2017-03-27 DIAGNOSIS — O30039 Twin pregnancy, monochorionic/diamniotic, unspecified trimester: Secondary | ICD-10-CM | POA: Diagnosis not present

## 2017-03-27 DIAGNOSIS — O0992 Supervision of high risk pregnancy, unspecified, second trimester: Secondary | ICD-10-CM

## 2017-03-27 MED ORDER — NITROFURANTOIN MONOHYD MACRO 100 MG PO CAPS: 100.0000 mg | ORAL_CAPSULE | Freq: Two times a day (BID) | ORAL | 1 refills | Status: DC

## 2017-03-27 NOTE — Progress Notes (Signed)
   PRENATAL VISIT NOTE  Subjective:  Bess HarvestBreanna A Foiles is a 26 y.o. G3P2002 at 2753w0d being seen today for ongoing prenatal care.  She is currently monitored for the following issues for this high-risk pregnancy and has Supervision of high-risk pregnancy; History of postpartum depression, currently pregnant; Short interval between pregnancies affecting pregnancy, antepartum; and Monochorionic diamniotic twin pregnancy, antepartum on her problem list.  Patient reports dysuria for a few days.  Contractions: Not present. Vag. Bleeding: None.  Movement: Present. Denies leaking of fluid.   The following portions of the patient's history were reviewed and updated as appropriate: allergies, current medications, past family history, past medical history, past social history, past surgical history and problem list. Problem list updated.  Objective:   Vitals:   03/27/17 0914  BP: 106/67  Pulse: 73  Weight: 144 lb (65.3 kg)    Fetal Status: Fetal Heart Rate (bpm): 146/150   Movement: Present     General:  Alert, oriented and cooperative. Patient is in no acute distress.  Skin: Skin is warm and dry. No rash noted.   Cardiovascular: Normal heart rate noted  Respiratory: Normal respiratory effort, no problems with respiration noted  Abdomen: Soft, gravid, appropriate for gestational age. Pain/Pressure: Absent     Pelvic:  Cervical exam deferred        Extremities: Normal range of motion.  Edema: None  Mental Status: Normal mood and affect. Normal behavior. Normal judgment and thought content.   Assessment and Plan:  Pregnancy: G3P2002 at 7853w0d  1. Monochorionic diamniotic twin pregnancy, antepartum Normal first screen. Anatomy scan and subsequent scans by MFM scheduled. AFP today. - US bedside; Future - AFP, Serum, Open Spina Bifida  2. Dysuria during pregnancy in second trimester UA showed hematuria, Macrobid prescribed. - POCT Urinalysis Dipstick - CULTURE, URINE COMPREHENSIVE -  nitrofurantoin, macrocrystal-monohydrate, (MACROBID) 100 MG capsule; Take 1 capsule (100 mg total) by mouth 2 (two) times daily.  Dispense: 14 capsule; Refill: 1  3. Supervision of high risk pregnancy in second trimester Preterm labor symptoms and general obstetric precautions including but not limited to vaginal bleeding, contractions, leaking of fluid and fetal movement were reviewed in detail with the patient. Please refer to After Visit Summary for other counseling recommendations.  Return in about 4 weeks (around 04/24/2017) for OB Visit.   Jaynie CollinsUgonna Anyanwu, MD

## 2017-03-27 NOTE — Progress Notes (Signed)
Bedside US shows two FHR - Baby A at 146bpm and Baby B at 150bpm

## 2017-03-27 NOTE — Patient Instructions (Signed)
Return to clinic for any scheduled appointments or obstetric concerns, or go to [facility] for evaluation  

## 2017-03-31 LAB — AFP, SERUM, OPEN SPINA BIFIDA
AFP MoM: 2.78
AFP VALUE AFPOSL: 122.2 ng/mL
Gest. Age on Collection Date: 18 weeks
Maternal Age At EDD: 26.7 yr
OSBR RISK 1 IN: 486
Test Results:: NEGATIVE
WEIGHT: 144 [lb_av]

## 2017-04-02 LAB — CULTURE, URINE COMPREHENSIVE

## 2017-04-03 ENCOUNTER — Ambulatory Visit (HOSPITAL_COMMUNITY)
Admission: RE | Admit: 2017-04-03 | Discharge: 2017-04-03 | Disposition: A | Discharge status: Home / Self Care | Payer: Medicaid Other | Source: Ambulatory Visit | Attending: Family Medicine | Admitting: Family Medicine

## 2017-04-03 ENCOUNTER — Other Ambulatory Visit: Payer: Self-pay | Admitting: Family Medicine

## 2017-04-03 ENCOUNTER — Encounter (HOSPITAL_COMMUNITY): Payer: Self-pay

## 2017-04-03 DIAGNOSIS — O30032 Twin pregnancy, monochorionic/diamniotic, second trimester: Secondary | ICD-10-CM | POA: Diagnosis not present

## 2017-04-03 DIAGNOSIS — O30039 Twin pregnancy, monochorionic/diamniotic, unspecified trimester: Secondary | ICD-10-CM | POA: Diagnosis present

## 2017-04-03 DIAGNOSIS — O09899 Supervision of other high risk pregnancies, unspecified trimester: Secondary | ICD-10-CM

## 2017-04-03 DIAGNOSIS — Z3A19 19 weeks gestation of pregnancy: Secondary | ICD-10-CM

## 2017-04-03 DIAGNOSIS — O0991 Supervision of high risk pregnancy, unspecified, first trimester: Secondary | ICD-10-CM | POA: Diagnosis not present

## 2017-04-03 DIAGNOSIS — O0992 Supervision of high risk pregnancy, unspecified, second trimester: Secondary | ICD-10-CM

## 2017-04-04 ENCOUNTER — Other Ambulatory Visit (HOSPITAL_COMMUNITY): Payer: Self-pay | Admitting: *Deleted

## 2017-04-04 DIAGNOSIS — O30032 Twin pregnancy, monochorionic/diamniotic, second trimester: Secondary | ICD-10-CM

## 2017-04-10 ENCOUNTER — Encounter: Payer: Self-pay | Admitting: *Deleted

## 2017-04-10 ENCOUNTER — Ambulatory Visit (INDEPENDENT_AMBULATORY_CARE_PROVIDER_SITE_OTHER): Payer: Medicaid Other | Admitting: Family Medicine

## 2017-04-10 VITALS — BP 131/71 | HR 94 | Wt 146.0 lb

## 2017-04-10 DIAGNOSIS — O0992 Supervision of high risk pregnancy, unspecified, second trimester: Secondary | ICD-10-CM

## 2017-04-10 DIAGNOSIS — O30032 Twin pregnancy, monochorionic/diamniotic, second trimester: Secondary | ICD-10-CM

## 2017-04-10 DIAGNOSIS — R300 Dysuria: Secondary | ICD-10-CM

## 2017-04-10 DIAGNOSIS — O4442 Low lying placenta NOS or without hemorrhage, second trimester: Secondary | ICD-10-CM

## 2017-04-10 DIAGNOSIS — O30039 Twin pregnancy, monochorionic/diamniotic, unspecified trimester: Secondary | ICD-10-CM

## 2017-04-10 DIAGNOSIS — O4440 Low lying placenta NOS or without hemorrhage, unspecified trimester: Secondary | ICD-10-CM

## 2017-04-10 LAB — POCT URINALYSIS DIPSTICK
BILIRUBIN UA: NEGATIVE
GLUCOSE UA: NEGATIVE
Ketones, UA: NEGATIVE
NITRITE UA: NEGATIVE
Protein, UA: NEGATIVE
Spec Grav, UA: 1.015 (ref 1.010–1.025)
UROBILINOGEN UA: 0.2 U/dL
pH, UA: 6 (ref 5.0–8.0)

## 2017-04-10 MED ORDER — CEPHALEXIN 500 MG PO CAPS: 500.0000 mg | ORAL_CAPSULE | Freq: Four times a day (QID) | ORAL | 0 refills | Status: DC

## 2017-04-10 NOTE — Progress Notes (Addendum)
   PRENATAL VISIT NOTE  Subjective:  Sharon Chandler is a 26 y.o. G3P2002 at 4222w0d being seen today for ongoing prenatal care.  She is currently monitored for the following issues for this high-risk pregnancy and has Supervision of high-risk pregnancy; History of postpartum depression, currently pregnant; Short interval between pregnancies affecting pregnancy, antepartum; and Monochorionic diamniotic twin pregnancy, antepartum on her problem list.  Patient reports backache. Feels like when she had to be hospitalized for pyelonephritis. She denies fever, chills, Nausea, vomiting. Contractions: Not present. Vag. Bleeding: None.  Movement: Present. Denies leaking of fluid.   The following portions of the patient's history were reviewed and updated as appropriate: allergies, current medications, past family history, past medical history, past social history, past surgical history and problem list. Problem list updated.  Objective:   Vitals:   04/10/17 1626  BP: 131/71  Pulse: 94  Weight: 146 lb (66.2 kg)    Fetal Status: Fetal Heart Rate (bpm): 141/150   Movement: Present     General:  Alert, oriented and cooperative. Patient is in no acute distress.  Skin: Skin is warm and dry. No rash noted.   Cardiovascular: Normal heart rate noted  Respiratory: Normal respiratory effort, no problems with respiration noted  Abdomen: Soft, gravid, appropriate for gestational age. Pain/Pressure: Absent     Pelvic:  Cervical exam deferred        Extremities: Normal range of motion.  Edema: None  Mental Status: Normal mood and affect. Normal behavior. Normal judgment and thought content.   Urinalysis  Color, UA  Yellow    Clarity, UA  Cloudy    Glucose, UA  Negative    Bilirubin, UA  Negative    Ketones, UA  Negative    Spec Grav, UA 1.010 - 1.025 1.015    Blood, UA  Large !    pH, UA 5.0 - 8.0 6.0    Protein, UA  Negative    Urobilinogen, UA 0.2 or 1.0 E.U./dL 0.2    Nitrite, UA  Negative      Leukocytes, UA Negative Large (3+)       Assessment and Plan:  Pregnancy: G3P2002 at 3422w0d  1. Supervision of high risk pregnancy in second trimester Anatomy u/s x 2 WNL Female x 2 Low lying placenta  2. Monochorionic diamniotic twin pregnancy, antepartum Has MFM f/u  3. Dysuria Check culture, begin abx  - CULTURE, URINE COMPREHENSIVE - POCT Urinalysis Dipstick - cephALEXin (KEFLEX) 500 MG capsule; Take 1 capsule (500 mg total) by mouth 4 (four) times daily.  Dispense: 28 capsule; Refill: 0   General obstetric precautions including but not limited to vaginal bleeding, contractions, leaking of fluid and fetal movement were reviewed in detail with the patient. Please refer to After Visit Summary for other counseling recommendations.  Return in 4 weeks (on 05/08/2017).   Reva Boresanya S Syrena Burges, MD

## 2017-04-10 NOTE — Patient Instructions (Signed)
 Second Trimester of Pregnancy The second trimester is from week 14 through week 27 (months 4 through 6). The second trimester is often a time when you feel your best. Your body has adjusted to being pregnant, and you begin to feel better physically. Usually, morning sickness has lessened or quit completely, you may have more energy, and you may have an increase in appetite. The second trimester is also a time when the fetus is growing rapidly. At the end of the sixth month, the fetus is about 9 inches long and weighs about 1 pounds. You will likely begin to feel the baby move (quickening) between 16 and 20 weeks of pregnancy. Body changes during your second trimester Your body continues to go through many changes during your second trimester. The changes vary from woman to woman.  Your weight will continue to increase. You will notice your lower abdomen bulging out.  You may begin to get stretch marks on your hips, abdomen, and breasts.  You may develop headaches that can be relieved by medicines. The medicines should be approved by your health care provider.  You may urinate more often because the fetus is pressing on your bladder.  You may develop or continue to have heartburn as a result of your pregnancy.  You may develop constipation because certain hormones are causing the muscles that push waste through your intestines to slow down.  You may develop hemorrhoids or swollen, bulging veins (varicose veins).  You may have back pain. This is caused by: ? Weight gain. ? Pregnancy hormones that are relaxing the joints in your pelvis. ? A shift in weight and the muscles that support your balance.  Your breasts will continue to grow and they will continue to become tender.  Your gums may bleed and may be sensitive to brushing and flossing.  Dark spots or blotches (chloasma, mask of pregnancy) may develop on your face. This will likely fade after the baby is born.  A dark line from  your belly button to the pubic area (linea nigra) may appear. This will likely fade after the baby is born.  You may have changes in your hair. These can include thickening of your hair, rapid growth, and changes in texture. Some women also have hair loss during or after pregnancy, or hair that feels dry or thin. Your hair will most likely return to normal after your baby is born.  What to expect at prenatal visits During a routine prenatal visit:  You will be weighed to make sure you and the fetus are growing normally.  Your blood pressure will be taken.  Your abdomen will be measured to track your baby's growth.  The fetal heartbeat will be listened to.  Any test results from the previous visit will be discussed.  Your health care provider may ask you:  How you are feeling.  If you are feeling the baby move.  If you have had any abnormal symptoms, such as leaking fluid, bleeding, severe headaches, or abdominal cramping.  If you are using any tobacco products, including cigarettes, chewing tobacco, and electronic cigarettes.  If you have any questions.  Other tests that may be performed during your second trimester include:  Blood tests that check for: ? Low iron levels (anemia). ? High blood sugar that affects pregnant women (gestational diabetes) between 24 and 28 weeks. ? Rh antibodies. This is to check for a protein on red blood cells (Rh factor).  Urine tests to check for infections, diabetes,   or protein in the urine.  An ultrasound to confirm the proper growth and development of the baby.  An amniocentesis to check for possible genetic problems.  Fetal screens for spina bifida and Down syndrome.  HIV (human immunodeficiency virus) testing. Routine prenatal testing includes screening for HIV, unless you choose not to have this test.  Follow these instructions at home: Medicines  Follow your health care provider's instructions regarding medicine use. Specific  medicines may be either safe or unsafe to take during pregnancy.  Take a prenatal vitamin that contains at least 600 micrograms (mcg) of folic acid.  If you develop constipation, try taking a stool softener if your health care provider approves. Eating and drinking  Eat a balanced diet that includes fresh fruits and vegetables, whole grains, good sources of protein such as meat, eggs, or tofu, and low-fat dairy. Your health care provider will help you determine the amount of weight gain that is right for you.  Avoid raw meat and uncooked cheese. These carry germs that can cause birth defects in the baby.  If you have low calcium intake from food, talk to your health care provider about whether you should take a daily calcium supplement.  Limit foods that are high in fat and processed sugars, such as fried and sweet foods.  To prevent constipation: ? Drink enough fluid to keep your urine clear or pale yellow. ? Eat foods that are high in fiber, such as fresh fruits and vegetables, whole grains, and beans. Activity  Exercise only as directed by your health care provider. Most women can continue their usual exercise routine during pregnancy. Try to exercise for 30 minutes at least 5 days a week. Stop exercising if you experience uterine contractions.  Avoid heavy lifting, wear low heel shoes, and practice good posture.  A sexual relationship may be continued unless your health care provider directs you otherwise. Relieving pain and discomfort  Wear a good support bra to prevent discomfort from breast tenderness.  Take warm sitz baths to soothe any pain or discomfort caused by hemorrhoids. Use hemorrhoid cream if your health care provider approves.  Rest with your legs elevated if you have leg cramps or low back pain.  If you develop varicose veins, wear support hose. Elevate your feet for 15 minutes, 3-4 times a day. Limit salt in your diet. Prenatal Care  Write down your questions.  Take them to your prenatal visits.  Keep all your prenatal visits as told by your health care provider. This is important. Safety  Wear your seat belt at all times when driving.  Make a list of emergency phone numbers, including numbers for family, friends, the hospital, and police and fire departments. General instructions  Ask your health care provider for a referral to a local prenatal education class. Begin classes no later than the beginning of month 6 of your pregnancy.  Ask for help if you have counseling or nutritional needs during pregnancy. Your health care provider can offer advice or refer you to specialists for help with various needs.  Do not use hot tubs, steam rooms, or saunas.  Do not douche or use tampons or scented sanitary pads.  Do not cross your legs for long periods of time.  Avoid cat litter boxes and soil used by cats. These carry germs that can cause birth defects in the baby and possibly loss of the fetus by miscarriage or stillbirth.  Avoid all smoking, herbs, alcohol, and unprescribed drugs. Chemicals in these products   can affect the formation and growth of the baby.  Do not use any products that contain nicotine or tobacco, such as cigarettes and e-cigarettes. If you need help quitting, ask your health care provider.  Visit your dentist if you have not gone yet during your pregnancy. Use a soft toothbrush to brush your teeth and be gentle when you floss. Contact a health care provider if:  You have dizziness.  You have mild pelvic cramps, pelvic pressure, or nagging pain in the abdominal area.  You have persistent nausea, vomiting, or diarrhea.  You have a bad smelling vaginal discharge.  You have pain when you urinate. Get help right away if:  You have a fever.  You are leaking fluid from your vagina.  You have spotting or bleeding from your vagina.  You have severe abdominal cramping or pain.  You have rapid weight gain or weight  loss.  You have shortness of breath with chest pain.  You notice sudden or extreme swelling of your face, hands, ankles, feet, or legs.  You have not felt your baby move in over an hour.  You have severe headaches that do not go away when you take medicine.  You have vision changes. Summary  The second trimester is from week 14 through week 27 (months 4 through 6). It is also a time when the fetus is growing rapidly.  Your body goes through many changes during pregnancy. The changes vary from woman to woman.  Avoid all smoking, herbs, alcohol, and unprescribed drugs. These chemicals affect the formation and growth your baby.  Do not use any tobacco products, such as cigarettes, chewing tobacco, and e-cigarettes. If you need help quitting, ask your health care provider.  Contact your health care provider if you have any questions. Keep all prenatal visits as told by your health care provider. This is important. This information is not intended to replace advice given to you by your health care provider. Make sure you discuss any questions you have with your health care provider. Document Released: 09/19/2001 Document Revised: 03/02/2016 Document Reviewed: 11/26/2012 [employer] Interactive Patient Education  2017 [employer]   Breastfeeding Deciding to breastfeed is one of the best choices you can make for you and your baby. A change in hormones during pregnancy causes your breast tissue to grow and increases the number and size of your milk ducts. These hormones also allow proteins, sugars, and fats from your blood supply to make breast milk in your milk-producing glands. Hormones prevent breast milk from being released before your baby is born as well as prompt milk flow after birth. Once breastfeeding has begun, thoughts of your baby, as well as his or her sucking or crying, can stimulate the release of milk from your milk-producing glands. Benefits of breastfeeding For Your  Baby  Your first milk (colostrum) helps your baby's digestive system function better.  There are antibodies in your milk that help your baby fight off infections.  Your baby has a lower incidence of asthma, allergies, and sudden infant death syndrome.  The nutrients in breast milk are better for your baby than infant formulas and are designed uniquely for your baby's needs.  Breast milk improves your baby's brain development.  Your baby is less likely to develop other conditions, such as childhood obesity, asthma, or type 2 diabetes mellitus.  For You  Breastfeeding helps to create a very special bond between you and your baby.  Breastfeeding is convenient. Breast milk is always available at   the correct temperature and costs nothing.  Breastfeeding helps to burn calories and helps you lose the weight gained during pregnancy.  Breastfeeding makes your uterus contract to its prepregnancy size faster and slows bleeding (lochia) after you give birth.  Breastfeeding helps to lower your risk of developing type 2 diabetes mellitus, osteoporosis, and breast or ovarian cancer later in life.  Signs that your baby is hungry Early Signs of Hunger  Increased alertness or activity.  Stretching.  Movement of the head from side to side.  Movement of the head and opening of the mouth when the corner of the mouth or cheek is stroked (rooting).  Increased sucking sounds, smacking lips, cooing, sighing, or squeaking.  Hand-to-mouth movements.  Increased sucking of fingers or hands.  Late Signs of Hunger  Fussing.  Intermittent crying.  Extreme Signs of Hunger Signs of extreme hunger will require calming and consoling before your baby will be able to breastfeed successfully. Do not wait for the following signs of extreme hunger to occur before you initiate breastfeeding:  Restlessness.  A loud, strong cry.  Screaming.  Breastfeeding basics Breastfeeding Initiation  Find a  comfortable place to sit or lie down, with your neck and back well supported.  Place a pillow or rolled up blanket under your baby to bring him or her to the level of your breast (if you are seated). Nursing pillows are specially designed to help support your arms and your baby while you breastfeed.  Make sure that your baby's abdomen is facing your abdomen.  Gently massage your breast. With your fingertips, massage from your chest wall toward your nipple in a circular motion. This encourages milk flow. You may need to continue this action during the feeding if your milk flows slowly.  Support your breast with 4 fingers underneath and your thumb above your nipple. Make sure your fingers are well away from your nipple and your baby's mouth.  Stroke your baby's lips gently with your finger or nipple.  When your baby's mouth is open wide enough, quickly bring your baby to your breast, placing your entire nipple and as much of the colored area around your nipple (areola) as possible into your baby's mouth. ? More areola should be visible above your baby's upper lip than below the lower lip. ? Your baby's tongue should be between his or her lower gum and your breast.  Ensure that your baby's mouth is correctly positioned around your nipple (latched). Your baby's lips should create a seal on your breast and be turned out (everted).  It is common for your baby to suck about 2-3 minutes in order to start the flow of breast milk.  Latching Teaching your baby how to latch on to your breast properly is very important. An improper latch can cause nipple pain and decreased milk supply for you and poor weight gain in your baby. Also, if your baby is not latched onto your nipple properly, he or she may swallow some air during feeding. This can make your baby fussy. Burping your baby when you switch breasts during the feeding can help to get rid of the air. However, teaching your baby to latch on properly is  still the best way to prevent fussiness from swallowing air while breastfeeding. Signs that your baby has successfully latched on to your nipple:  Silent tugging or silent sucking, without causing you pain.  Swallowing heard between every 3-4 sucks.  Muscle movement above and in front of his or her   ears while sucking.  Signs that your baby has not successfully latched on to nipple:  Sucking sounds or smacking sounds from your baby while breastfeeding.  Nipple pain.  If you think your baby has not latched on correctly, slip your finger into the corner of your baby's mouth to break the suction and place it between your baby's gums. Attempt breastfeeding initiation again. Signs of Successful Breastfeeding Signs from your baby:  A gradual decrease in the number of sucks or complete cessation of sucking.  Falling asleep.  Relaxation of his or her body.  Retention of a small amount of milk in his or her mouth.  Letting go of your breast by himself or herself.  Signs from you:  Breasts that have increased in firmness, weight, and size 1-3 hours after feeding.  Breasts that are softer immediately after breastfeeding.  Increased milk volume, as well as a change in milk consistency and color by the fifth day of breastfeeding.  Nipples that are not sore, cracked, or bleeding.  Signs That Your Baby is Getting Enough Milk  Wetting at least 1-2 diapers during the first 24 hours after birth.  Wetting at least 5-6 diapers every 24 hours for the first week after birth. The urine should be clear or pale yellow by 5 days after birth.  Wetting 6-8 diapers every 24 hours as your baby continues to grow and develop.  At least 3 stools in a 24-hour period by age 5 days. The stool should be soft and yellow.  At least 3 stools in a 24-hour period by age 7 days. The stool should be seedy and yellow.  No loss of weight greater than 10% of birth weight during the first 3 days of age.  Average  weight gain of 4-7 ounces (113-198 g) per week after age 4 days.  Consistent daily weight gain by age 5 days, without weight loss after the age of 2 weeks.  After a feeding, your baby may spit up a small amount. This is common. Breastfeeding frequency and duration Frequent feeding will help you make more milk and can prevent sore nipples and breast engorgement. Breastfeed when you feel the need to reduce the fullness of your breasts or when your baby shows signs of hunger. This is called "breastfeeding on demand." Avoid introducing a pacifier to your baby while you are working to establish breastfeeding (the first 4-6 weeks after your baby is born). After this time you may choose to use a pacifier. Research has shown that pacifier use during the first year of a baby's life decreases the risk of sudden infant death syndrome (SIDS). Allow your baby to feed on each breast as long as he or she wants. Breastfeed until your baby is finished feeding. When your baby unlatches or falls asleep while feeding from the first breast, offer the second breast. Because newborns are often sleepy in the first few weeks of life, you may need to awaken your baby to get him or her to feed. Breastfeeding times will vary from baby to baby. However, the following rules can serve as a guide to help you ensure that your baby is properly fed:  Newborns (babies 4 weeks of age or younger) may breastfeed every 1-3 hours.  Newborns should not go longer than 3 hours during the day or 5 hours during the night without breastfeeding.  You should breastfeed your baby a minimum of 8 times in a 24-hour period until you begin to introduce solid foods to your   baby at around 6 months of age.  Breast milk pumping Pumping and storing breast milk allows you to ensure that your baby is exclusively fed your breast milk, even at times when you are unable to breastfeed. This is especially important if you are going back to work while you are still  breastfeeding or when you are not able to be present during feedings. Your lactation consultant can give you guidelines on how long it is safe to store breast milk. A breast pump is a machine that allows you to pump milk from your breast into a sterile bottle. The pumped breast milk can then be stored in a refrigerator or freezer. Some breast pumps are operated by hand, while others use electricity. Ask your lactation consultant which type will work best for you. Breast pumps can be purchased, but some hospitals and breastfeeding support groups lease breast pumps on a monthly basis. A lactation consultant can teach you how to hand express breast milk, if you prefer not to use a pump. Caring for your breasts while you breastfeed Nipples can become dry, cracked, and sore while breastfeeding. The following recommendations can help keep your breasts moisturized and healthy:  Avoid using soap on your nipples.  Wear a supportive bra. Although not required, special nursing bras and tank tops are designed to allow access to your breasts for breastfeeding without taking off your entire bra or top. Avoid wearing underwire-style bras or extremely tight bras.  Air dry your nipples for 3-4minutes after each feeding.  Use only cotton bra pads to absorb leaked breast milk. Leaking of breast milk between feedings is normal.  Use lanolin on your nipples after breastfeeding. Lanolin helps to maintain your skin's normal moisture barrier. If you use pure lanolin, you do not need to wash it off before feeding your baby again. Pure lanolin is not toxic to your baby. You may also hand express a few drops of breast milk and gently massage that milk into your nipples and allow the milk to air dry.  In the first few weeks after giving birth, some women experience extremely full breasts (engorgement). Engorgement can make your breasts feel heavy, warm, and tender to the touch. Engorgement peaks within 3-5 days after you give  birth. The following recommendations can help ease engorgement:  Completely empty your breasts while breastfeeding or pumping. You may want to start by applying warm, moist heat (in the shower or with warm water-soaked hand towels) just before feeding or pumping. This increases circulation and helps the milk flow. If your baby does not completely empty your breasts while breastfeeding, pump any extra milk after he or she is finished.  Wear a snug bra (nursing or regular) or tank top for 1-2 days to signal your body to slightly decrease milk production.  Apply ice packs to your breasts, unless this is too uncomfortable for you.  Make sure that your baby is latched on and positioned properly while breastfeeding.  If engorgement persists after 48 hours of following these recommendations, contact your health care provider or a lactation consultant. Overall health care recommendations while breastfeeding  Eat healthy foods. Alternate between meals and snacks, eating 3 of each per day. Because what you eat affects your breast milk, some of the foods may make your baby more irritable than usual. Avoid eating these foods if you are sure that they are negatively affecting your baby.  Drink milk, fruit juice, and water to satisfy your thirst (about 10 glasses a day).    Rest often, relax, and continue to take your prenatal vitamins to prevent fatigue, stress, and anemia.  Continue breast self-awareness checks.  Avoid chewing and smoking tobacco. Chemicals from cigarettes that pass into breast milk and exposure to secondhand smoke may harm your baby.  Avoid alcohol and drug use, including marijuana. Some medicines that may be harmful to your baby can pass through breast milk. It is important to ask your health care provider before taking any medicine, including all over-the-counter and prescription medicine as well as vitamin and herbal supplements. It is possible to become pregnant while breastfeeding.  If birth control is desired, ask your health care provider about options that will be safe for your baby. Contact a health care provider if:  You feel like you want to stop breastfeeding or have become frustrated with breastfeeding.  You have painful breasts or nipples.  Your nipples are cracked or bleeding.  Your breasts are red, tender, or warm.  You have a swollen area on either breast.  You have a fever or chills.  You have nausea or vomiting.  You have drainage other than breast milk from your nipples.  Your breasts do not become full before feedings by the fifth day after you give birth.  You feel sad and depressed.  Your baby is too sleepy to eat well.  Your baby is having trouble sleeping.  Your baby is wetting less than 3 diapers in a 24-hour period.  Your baby has less than 3 stools in a 24-hour period.  Your baby's skin or the white part of his or her eyes becomes yellow.  Your baby is not gaining weight by 5 days of age. Get help right away if:  Your baby is overly tired (lethargic) and does not want to wake up and feed.  Your baby develops an unexplained fever. This information is not intended to replace advice given to you by your health care provider. Make sure you discuss any questions you have with your health care provider. Document Released: 09/25/2005 Document Revised: 03/08/2016 Document Reviewed: 03/19/2013 [employer] Interactive Patient Education  2017 [employer]  

## 2017-04-12 ENCOUNTER — Emergency Department
Admission: EM | Admit: 2017-04-12 | Discharge: 2017-04-12 | Disposition: A | Discharge status: Home / Self Care | Payer: Medicaid Other | Attending: Emergency Medicine | Admitting: Emergency Medicine

## 2017-04-12 ENCOUNTER — Emergency Department: Payer: Medicaid Other

## 2017-04-12 ENCOUNTER — Encounter: Payer: Self-pay | Admitting: *Deleted

## 2017-04-12 DIAGNOSIS — F17210 Nicotine dependence, cigarettes, uncomplicated: Secondary | ICD-10-CM | POA: Insufficient documentation

## 2017-04-12 DIAGNOSIS — O99332 Smoking (tobacco) complicating pregnancy, second trimester: Secondary | ICD-10-CM | POA: Diagnosis not present

## 2017-04-12 DIAGNOSIS — O2392 Unspecified genitourinary tract infection in pregnancy, second trimester: Secondary | ICD-10-CM | POA: Insufficient documentation

## 2017-04-12 DIAGNOSIS — N12 Tubulo-interstitial nephritis, not specified as acute or chronic: Secondary | ICD-10-CM

## 2017-04-12 DIAGNOSIS — Z3A20 20 weeks gestation of pregnancy: Secondary | ICD-10-CM | POA: Diagnosis not present

## 2017-04-12 DIAGNOSIS — R1031 Right lower quadrant pain: Secondary | ICD-10-CM | POA: Diagnosis present

## 2017-04-12 LAB — URINALYSIS, COMPLETE (UACMP) WITH MICROSCOPIC
BILIRUBIN URINE: NEGATIVE
GLUCOSE, UA: 50 mg/dL — AB
KETONES UR: NEGATIVE mg/dL
NITRITE: NEGATIVE
PH: 7 (ref 5.0–8.0)
Protein, ur: NEGATIVE mg/dL
SPECIFIC GRAVITY, URINE: 1.011 (ref 1.005–1.030)

## 2017-04-12 LAB — CBC WITH DIFFERENTIAL/PLATELET
Basophils Absolute: 0 10*3/uL (ref 0–0.1)
Basophils Relative: 0 %
EOS PCT: 1 %
Eosinophils Absolute: 0.1 10*3/uL (ref 0–0.7)
HEMATOCRIT: 32.3 % — AB (ref 35.0–47.0)
Hemoglobin: 11.1 g/dL — ABNORMAL LOW (ref 12.0–16.0)
Lymphocytes Relative: 9 %
Lymphs Abs: 1 10*3/uL (ref 1.0–3.6)
MCH: 30.8 pg (ref 26.0–34.0)
MCHC: 34.3 g/dL (ref 32.0–36.0)
MCV: 90 fL (ref 80.0–100.0)
MONO ABS: 0.5 10*3/uL (ref 0.2–0.9)
MONOS PCT: 5 %
NEUTROS ABS: 9.8 10*3/uL — AB (ref 1.4–6.5)
Neutrophils Relative %: 85 %
PLATELETS: 251 10*3/uL (ref 150–440)
RBC: 3.59 MIL/uL — ABNORMAL LOW (ref 3.80–5.20)
RDW: 13.3 % (ref 11.5–14.5)
WBC: 11.3 10*3/uL — ABNORMAL HIGH (ref 3.6–11.0)

## 2017-04-12 LAB — COMPREHENSIVE METABOLIC PANEL
ALBUMIN: 3.1 g/dL — AB (ref 3.5–5.0)
ALT: 9 U/L — ABNORMAL LOW (ref 14–54)
ANION GAP: 7 (ref 5–15)
AST: 18 U/L (ref 15–41)
Alkaline Phosphatase: 76 U/L (ref 38–126)
BILIRUBIN TOTAL: 0.5 mg/dL (ref 0.3–1.2)
BUN: 8 mg/dL (ref 6–20)
CHLORIDE: 103 mmol/L (ref 101–111)
CO2: 24 mmol/L (ref 22–32)
Calcium: 9.1 mg/dL (ref 8.9–10.3)
Creatinine, Ser: 0.66 mg/dL (ref 0.44–1.00)
GFR calc Af Amer: >60 mL/min (ref >60)
Glucose, Bld: 145 mg/dL — ABNORMAL HIGH (ref 65–99)
POTASSIUM: 3.3 mmol/L — AB (ref 3.5–5.1)
Sodium: 134 mmol/L — ABNORMAL LOW (ref 135–145)
TOTAL PROTEIN: 6.6 g/dL (ref 6.5–8.1)

## 2017-04-12 LAB — ACETAMINOPHEN LEVEL: Acetaminophen (Tylenol), Serum: <10 ug/mL — ABNORMAL LOW (ref 10–30)

## 2017-04-12 MED ORDER — OXYCODONE HCL 5 MG PO TABS
5.0000 mg | ORAL_TABLET | Freq: Once | ORAL | Status: AC
  Administered 2017-04-12: 5 mg via ORAL
  Filled 2017-04-12: qty 1

## 2017-04-12 MED ORDER — DEXTROSE 5 % IV SOLN
1.0000 g | Freq: Once | INTRAVENOUS | Status: AC
  Administered 2017-04-12: 1 g via INTRAVENOUS
  Filled 2017-04-12: qty 10

## 2017-04-12 MED ORDER — OXYCODONE HCL 5 MG PO TABS: 5.0000 mg | ORAL_TABLET | Freq: Every evening | ORAL | 0 refills | Status: DC | PRN
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
## 2017-04-12 NOTE — ED Notes (Signed)
Patient transported to Ultrasound 

## 2017-04-12 NOTE — ED Triage Notes (Addendum)
Patient c/o right flank pain that has been present for one week. Patient was treated for a UTI and after seeing her OB on Tuesday was prescribed more "uti medication." Patient states she has been taking 5-6 500mg  Tylenol PO 2-3 a day for the pain. Patient was educated on use of Tylenol by this Clinical research associatewriter. Patient is 20-weeks pregnant with twins. Patient states blood was found in her urine on Tuesday. Patient c/o intermittent emesis which has been present during entire pregnancy.

## 2017-04-12 NOTE — Discharge Instructions (Signed)
Continue to take the antibiotics as prescribed. Do not take more than 4000 mg of Tylenol a day as it is very dangerous for your liver and your babies. Follow the instructions for pain control given to you by Dr. Elesa MassedWard. Follow-up with your OB on your appointment Tuesday. Return to the emergency room if you have fever or if her pain is getting worse.

## 2017-04-14 LAB — CULTURE, URINE COMPREHENSIVE

## 2017-04-14 LAB — URINE CULTURE: Culture: NO GROWTH

## 2017-04-16 ENCOUNTER — Encounter: Payer: Self-pay | Admitting: *Deleted

## 2017-04-16 ENCOUNTER — Ambulatory Visit (INDEPENDENT_AMBULATORY_CARE_PROVIDER_SITE_OTHER): Payer: Medicaid Other | Admitting: Family Medicine

## 2017-04-16 VITALS — BP 117/71 | HR 67 | Wt 150.0 lb

## 2017-04-16 DIAGNOSIS — O4442 Low lying placenta NOS or without hemorrhage, second trimester: Secondary | ICD-10-CM

## 2017-04-16 DIAGNOSIS — N1330 Unspecified hydronephrosis: Secondary | ICD-10-CM

## 2017-04-16 DIAGNOSIS — O0992 Supervision of high risk pregnancy, unspecified, second trimester: Secondary | ICD-10-CM

## 2017-04-16 DIAGNOSIS — O09899 Supervision of other high risk pregnancies, unspecified trimester: Secondary | ICD-10-CM

## 2017-04-16 DIAGNOSIS — O30039 Twin pregnancy, monochorionic/diamniotic, unspecified trimester: Secondary | ICD-10-CM

## 2017-04-16 DIAGNOSIS — O4440 Low lying placenta NOS or without hemorrhage, unspecified trimester: Secondary | ICD-10-CM

## 2017-04-16 DIAGNOSIS — O30032 Twin pregnancy, monochorionic/diamniotic, second trimester: Secondary | ICD-10-CM

## 2017-04-16 NOTE — Progress Notes (Signed)
   PRENATAL VISIT NOTE  Subjective:  Sharon Chandler is a 26 y.o. G3P2002 at 6271w6d being seen today for ongoing prenatal care.  She is currently monitored for the following issues for this high-risk pregnancy and has Supervision of high-risk pregnancy; History of postpartum depression, currently pregnant; Short interval between pregnancies affecting pregnancy, antepartum; Monochorionic diamniotic twin pregnancy, antepartum; and Low-lying placenta on her problem list.  Patient reports right flank pain. Patient seen 6 days ago, had flank pain and found to have UTI. Given antibiotics. [facility] days [facility] - US showed hydronephrosis on right. Was given prescription for percocet. Pain has decreased after antibiotics. Still having increased pain at night - thinks its more because she's trying to sleep. Has been taking the percocet occasionally, but doesn't want to continue them.  Contractions: Not present. Vag. Bleeding: None.  Movement: Present. Denies leaking of fluid.   The following portions of the patient's history were reviewed and updated as appropriate: allergies, current medications, past family history, past medical history, past social history, past surgical history and problem list. Problem list updated.  Objective:   Vitals:   04/16/17 1602  BP: 117/71  Pulse: 67  Weight: 150 lb (68 kg)    Fetal Status: Fetal Heart Rate (bpm): 140/148   Movement: Present     General:  Alert, oriented and cooperative. Patient is in no acute distress.  Skin: Skin is warm and dry. No rash noted.   Cardiovascular: Normal heart rate noted  Respiratory: Normal respiratory effort, no problems with respiration noted  Abdomen: Soft, gravid, appropriate for gestational age. Pain/Pressure: Absent. Right flank pain to palpation    Pelvic:  Cervical exam deferred        Extremities: Normal range of motion.  Edema: Trace  Mental Status: Normal mood and affect. Normal behavior.  Normal judgment and thought content.   Assessment and Plan:  Pregnancy: G3P2002 at 6271w6d  1. Supervision of high risk pregnancy in second trimester FHT and FH normal  2. Low-lying placenta Will follow with serial US  3. Monochorionic diamniotic twin pregnancy, antepartum Serial growths  4. Short interval between pregnancies affecting pregnancy, antepartum  5. Hydronephrosis, unspecified hydronephrosis type No evidence of UTI now. Finish Abx. Improved pain. Recommended trial of benadryl to help with sleep. Also try letting abd to hang by bending over side of pool to allow decompression of ureter. If she becomes more symptomatic, may need urology referral to evaluate for stenting, although she may experience discomfortable from stent. Discussed symptoms of pyelonephritis: fevers, chills, nausea, vomiting.  Preterm labor symptoms and general obstetric precautions including but not limited to vaginal bleeding, contractions, leaking of fluid and fetal movement were reviewed in detail with the patient. Please refer to After Visit Summary for other counseling recommendations.  No Follow-up on file.   Levie HeritageJacob J Kidada Ging, DO

## 2017-04-16 NOTE — Progress Notes (Signed)
Pt has been treated for UTI. Hx Pyelonephritis with last pregnancy. [facility] and needs f/u with OB. Pt states she continues to have flank pain and taking Rx analgesics qhs.

## 2017-04-16 NOTE — Addendum Note (Signed)
Addended by: Levie HeritageSTINSON, JACOB J on: 04/16/2017 04:50 PM   Modules accepted: Level of Service

## 2017-04-17 ENCOUNTER — Other Ambulatory Visit (HOSPITAL_COMMUNITY): Payer: Self-pay | Admitting: Obstetrics and Gynecology

## 2017-04-17 ENCOUNTER — Encounter (HOSPITAL_COMMUNITY): Payer: Self-pay

## 2017-04-17 ENCOUNTER — Ambulatory Visit (HOSPITAL_COMMUNITY)
Admission: RE | Admit: 2017-04-17 | Discharge: 2017-04-17 | Disposition: A | Discharge status: Home / Self Care | Payer: Medicaid Other | Source: Ambulatory Visit | Attending: Family Medicine | Admitting: Family Medicine

## 2017-04-17 DIAGNOSIS — O30032 Twin pregnancy, monochorionic/diamniotic, second trimester: Secondary | ICD-10-CM

## 2017-04-17 DIAGNOSIS — O321XX2 Maternal care for breech presentation, fetus 2: Secondary | ICD-10-CM | POA: Insufficient documentation

## 2017-04-17 DIAGNOSIS — O09892 Supervision of other high risk pregnancies, second trimester: Secondary | ICD-10-CM

## 2017-04-17 DIAGNOSIS — O30039 Twin pregnancy, monochorionic/diamniotic, unspecified trimester: Secondary | ICD-10-CM

## 2017-04-17 DIAGNOSIS — Z3A21 21 weeks gestation of pregnancy: Secondary | ICD-10-CM

## 2017-04-17 DIAGNOSIS — O4440 Low lying placenta NOS or without hemorrhage, unspecified trimester: Secondary | ICD-10-CM

## 2017-04-17 DIAGNOSIS — O0992 Supervision of high risk pregnancy, unspecified, second trimester: Secondary | ICD-10-CM

## 2017-04-17 DIAGNOSIS — O09899 Supervision of other high risk pregnancies, unspecified trimester: Secondary | ICD-10-CM

## 2017-04-19 ENCOUNTER — Telehealth: Payer: Self-pay | Admitting: *Deleted

## 2017-04-19 DIAGNOSIS — N1330 Unspecified hydronephrosis: Secondary | ICD-10-CM | POA: Insufficient documentation

## 2017-04-19 MED ORDER — CYCLOBENZAPRINE HCL 10 MG PO TABS: 10.0000 mg | ORAL_TABLET | Freq: Three times a day (TID) | ORAL | 3 refills | Status: DC | PRN

## 2017-04-19 NOTE — Telephone Encounter (Signed)
Pt called in stating continues to have back pain. Per v/o Dr. Macon LargeAnyanwu sent in Flexeril and advised pt to take 1000mg  Tylenol no more than 3 times per day. Pt expressed understanding.

## 2017-04-24 ENCOUNTER — Encounter: Payer: Self-pay | Admitting: Radiology

## 2017-04-24 ENCOUNTER — Ambulatory Visit (INDEPENDENT_AMBULATORY_CARE_PROVIDER_SITE_OTHER): Payer: Medicaid Other | Admitting: Obstetrics & Gynecology

## 2017-04-24 VITALS — BP 116/74 | HR 96 | Wt 149.0 lb

## 2017-04-24 DIAGNOSIS — O4442 Low lying placenta NOS or without hemorrhage, second trimester: Secondary | ICD-10-CM

## 2017-04-24 DIAGNOSIS — O30039 Twin pregnancy, monochorionic/diamniotic, unspecified trimester: Secondary | ICD-10-CM

## 2017-04-24 DIAGNOSIS — O30032 Twin pregnancy, monochorionic/diamniotic, second trimester: Secondary | ICD-10-CM

## 2017-04-24 DIAGNOSIS — O0992 Supervision of high risk pregnancy, unspecified, second trimester: Secondary | ICD-10-CM

## 2017-04-24 DIAGNOSIS — O4440 Low lying placenta NOS or without hemorrhage, unspecified trimester: Secondary | ICD-10-CM

## 2017-04-24 NOTE — Patient Instructions (Signed)
Return to clinic for any scheduled appointments or obstetric concerns, or go to [facility] for evaluation  

## 2017-04-24 NOTE — Progress Notes (Signed)
   PRENATAL VISIT NOTE  Subjective:  Sharon Chandler is a 26 y.o. G3P2002 at 4687w0d being seen today for ongoing prenatal care.  She is currently monitored for the following issues for this high-risk pregnancy and has Supervision of high-risk pregnancy; History of postpartum depression, currently pregnant; Short interval between pregnancies affecting pregnancy, antepartum; Monochorionic diamniotic twin pregnancy, antepartum; Low-lying placenta; and Hydronephrosis on her problem list.  Patient reports no complaints.  Contractions: Not present. Vag. Bleeding: None.  Movement: Present. Denies leaking of fluid.   The following portions of the patient's history were reviewed and updated as appropriate: allergies, current medications, past family history, past medical history, past social history, past surgical history and problem list. Problem list updated.  Objective:   Vitals:   04/24/17 1009  BP: 116/74  Pulse: 96  Weight: 149 lb (67.6 kg)    Fetal Status: Fetal Heart Rate (bpm): 150/155   Movement: Present     General:  Alert, oriented and cooperative. Patient is in no acute distress.  Skin: Skin is warm and dry. No rash noted.   Cardiovascular: Normal heart rate noted  Respiratory: Normal respiratory effort, no problems with respiration noted  Abdomen: Soft, gravid, appropriate for gestational age.  Pain/Pressure: Absent     Pelvic: Cervical exam deferred        Extremities: Normal range of motion.  Edema: None  Mental Status:  Normal mood and affect. Normal behavior. Normal judgment and thought content.   Assessment and Plan:  Pregnancy: G3P2002 at 1887w0d  1. Monochorionic diamniotic twin pregnancy, antepartum Continue scans and surveillance as per MFM  2. Low-lying placenta Will follow up on next scan - US MFM OB Transvaginal; Future  3. Supervision of high risk pregnancy in second trimester Preterm labor symptoms and general obstetric precautions including but not  limited to vaginal bleeding, contractions, leaking of fluid and fetal movement were reviewed in detail with the patient. Please refer to After Visit Summary for other counseling recommendations.  Return in about 4 weeks (around 05/22/2017) for OB Visit.   Jaynie CollinsUgonna Eusebio Blazejewski, MD

## 2017-05-01 ENCOUNTER — Encounter (HOSPITAL_COMMUNITY): Payer: Self-pay

## 2017-05-01 ENCOUNTER — Ambulatory Visit (HOSPITAL_COMMUNITY)
Admission: RE | Admit: 2017-05-01 | Discharge: 2017-05-01 | Disposition: A | Discharge status: Home / Self Care | Payer: Medicaid Other | Source: Ambulatory Visit | Attending: Family Medicine | Admitting: Family Medicine

## 2017-05-01 DIAGNOSIS — Z3A23 23 weeks gestation of pregnancy: Secondary | ICD-10-CM | POA: Insufficient documentation

## 2017-05-01 DIAGNOSIS — O4440 Low lying placenta NOS or without hemorrhage, unspecified trimester: Secondary | ICD-10-CM

## 2017-05-01 DIAGNOSIS — O09892 Supervision of other high risk pregnancies, second trimester: Secondary | ICD-10-CM | POA: Diagnosis not present

## 2017-05-01 DIAGNOSIS — O30032 Twin pregnancy, monochorionic/diamniotic, second trimester: Secondary | ICD-10-CM | POA: Insufficient documentation

## 2017-05-01 DIAGNOSIS — O99332 Smoking (tobacco) complicating pregnancy, second trimester: Secondary | ICD-10-CM | POA: Insufficient documentation

## 2017-05-02 ENCOUNTER — Other Ambulatory Visit (HOSPITAL_COMMUNITY): Payer: Self-pay | Admitting: *Deleted

## 2017-05-02 DIAGNOSIS — O30039 Twin pregnancy, monochorionic/diamniotic, unspecified trimester: Secondary | ICD-10-CM

## 2017-05-15 ENCOUNTER — Ambulatory Visit (HOSPITAL_COMMUNITY)
Admission: RE | Admit: 2017-05-15 | Discharge: 2017-05-15 | Disposition: A | Discharge status: Home / Self Care | Payer: Medicaid Other | Source: Ambulatory Visit | Attending: Family Medicine | Admitting: Family Medicine

## 2017-05-15 ENCOUNTER — Encounter (HOSPITAL_COMMUNITY): Payer: Self-pay

## 2017-05-15 DIAGNOSIS — O09892 Supervision of other high risk pregnancies, second trimester: Secondary | ICD-10-CM | POA: Diagnosis not present

## 2017-05-15 DIAGNOSIS — Z3A25 25 weeks gestation of pregnancy: Secondary | ICD-10-CM | POA: Diagnosis not present

## 2017-05-15 DIAGNOSIS — O30032 Twin pregnancy, monochorionic/diamniotic, second trimester: Secondary | ICD-10-CM | POA: Insufficient documentation

## 2017-05-15 DIAGNOSIS — O99331 Smoking (tobacco) complicating pregnancy, first trimester: Secondary | ICD-10-CM | POA: Diagnosis not present

## 2017-05-18 NOTE — Progress Notes (Signed)
Beside US shows FHR  Baby A:132 Baby B:138

## 2017-05-22 ENCOUNTER — Other Ambulatory Visit: Payer: Self-pay | Admitting: Family Medicine

## 2017-05-22 ENCOUNTER — Ambulatory Visit (INDEPENDENT_AMBULATORY_CARE_PROVIDER_SITE_OTHER): Payer: Medicaid Other | Admitting: Family Medicine

## 2017-05-22 ENCOUNTER — Encounter: Payer: Self-pay | Admitting: Radiology

## 2017-05-22 VITALS — BP 122/83 | HR 105 | Wt 159.0 lb

## 2017-05-22 DIAGNOSIS — O30032 Twin pregnancy, monochorionic/diamniotic, second trimester: Secondary | ICD-10-CM

## 2017-05-22 DIAGNOSIS — O0992 Supervision of high risk pregnancy, unspecified, second trimester: Secondary | ICD-10-CM

## 2017-05-22 DIAGNOSIS — O30039 Twin pregnancy, monochorionic/diamniotic, unspecified trimester: Secondary | ICD-10-CM

## 2017-05-22 NOTE — Progress Notes (Signed)
   PRENATAL VISIT NOTE  Subjective:  Sharon Chandler is a 26 y.o. G3P2002 at 3779w0d being seen today for ongoing prenatal care.  She is currently monitored for the following issues for this high-risk pregnancy and has Supervision of high-risk pregnancy; History of postpartum depression, currently pregnant; Short interval between pregnancies affecting pregnancy, antepartum; Monochorionic diamniotic twin pregnancy, antepartum; Low-lying placenta; and Hydronephrosis on her problem list.  Patient reports vaginal discharge.  Contractions: Not present. Vag. Bleeding: None.  Movement: Present. Denies leaking of fluid.   The following portions of the patient's history were reviewed and updated as appropriate: allergies, current medications, past family history, past medical history, past social history, past surgical history and problem list. Problem list updated.  Objective:   Vitals:   05/22/17 1026  BP: 122/83  Pulse: (!) 105  Weight: 159 lb (72.1 kg)    Fetal Status: Fetal Heart Rate (bpm): 138/132 Fundal Height: 32 cm Movement: Present     General:  Alert, oriented and cooperative. Patient is in no acute distress.  Skin: Skin is warm and dry. No rash noted.   Cardiovascular: Normal heart rate noted  Respiratory: Normal respiratory effort, no problems with respiration noted  Abdomen: Soft, gravid, appropriate for gestational age.  Pain/Pressure: Absent     Pelvic: Cervical exam deferred        Extremities: Normal range of motion.  Edema: None  Mental Status:  Normal mood and affect. Normal behavior. Normal judgment and thought content.   Assessment and Plan:  Pregnancy: G3P2002 at 7679w0d  1. Monochorionic diamniotic twin pregnancy, antepartum Continue TTTS screening with MFM Thus far, growth is concordant - US bedside; Future - Urine cytology ancillary only  2. Supervision of high risk pregnancy in second trimester 28 wk labs next visit - US bedside; Future - Urine cytology  ancillary only - Culture, OB Urine  Preterm labor symptoms and general obstetric precautions including but not limited to vaginal bleeding, contractions, leaking of fluid and fetal movement were reviewed in detail with the patient. Please refer to After Visit Summary for other counseling recommendations.  Return in 2 weeks (on 06/05/2017) for ob visit, 28 wk labs.   Reva Boresanya S Emmalie Haigh, MD

## 2017-05-22 NOTE — Patient Instructions (Addendum)
 Third Trimester of Pregnancy The third trimester is from week 28 through week 40 (months 7 through 9). The third trimester is a time when the unborn baby (fetus) is growing rapidly. At the end of the ninth month, the fetus is about 20 inches in length and weighs 6-10 pounds. Body changes during your third trimester Your body will continue to go through many changes during pregnancy. The changes vary from woman to woman. During the third trimester:  Your weight will continue to increase. You can expect to gain 25-35 pounds (11-16 kg) by the end of the pregnancy.  You may begin to get stretch marks on your hips, abdomen, and breasts.  You may urinate more often because the fetus is moving lower into your pelvis and pressing on your bladder.  You may develop or continue to have heartburn. This is caused by increased hormones that slow down muscles in the digestive tract.  You may develop or continue to have constipation because increased hormones slow digestion and cause the muscles that push waste through your intestines to relax.  You may develop hemorrhoids. These are swollen veins (varicose veins) in the rectum that can itch or be painful.  You may develop swollen, bulging veins (varicose veins) in your legs.  You may have increased body aches in the pelvis, back, or thighs. This is due to weight gain and increased hormones that are relaxing your joints.  You may have changes in your hair. These can include thickening of your hair, rapid growth, and changes in texture. Some women also have hair loss during or after pregnancy, or hair that feels dry or thin. Your hair will most likely return to normal after your baby is born.  Your breasts will continue to grow and they will continue to become tender. A yellow fluid (colostrum) may leak from your breasts. This is the first milk you are producing for your baby.  Your belly button may stick out.  You may notice more swelling in your  hands, face, or ankles.  You may have increased tingling or numbness in your hands, arms, and legs. The skin on your belly may also feel numb.  You may feel short of breath because of your expanding uterus.  You may have more problems sleeping. This can be caused by the size of your belly, increased need to urinate, and an increase in your body's metabolism.  You may notice the fetus "dropping," or moving lower in your abdomen (lightening).  You may have increased vaginal discharge.  You may notice your joints feel loose and you may have pain around your pelvic bone.  What to expect at prenatal visits You will have prenatal exams every 2 weeks until week 36. Then you will have weekly prenatal exams. During a routine prenatal visit:  You will be weighed to make sure you and the baby are growing normally.  Your blood pressure will be taken.  Your abdomen will be measured to track your baby's growth.  The fetal heartbeat will be listened to.  Any test results from the previous visit will be discussed.  You may have a cervical check near your due date to see if your cervix has softened or thinned (effaced).  You will be tested for Group B streptococcus. This happens between 35 and 37 weeks.  Your health care provider may ask you:  What your birth plan is.  How you are feeling.  If you are feeling the baby move.  If you have   had any abnormal symptoms, such as leaking fluid, bleeding, severe headaches, or abdominal cramping.  If you are using any tobacco products, including cigarettes, chewing tobacco, and electronic cigarettes.  If you have any questions.  Other tests or screenings that may be performed during your third trimester include:  Blood tests that check for low iron levels (anemia).  Fetal testing to check the health, activity level, and growth of the fetus. Testing is done if you have certain medical conditions or if there are problems during the  pregnancy.  Nonstress test (NST). This test checks the health of your baby to make sure there are no signs of problems, such as the baby not getting enough oxygen. During this test, a belt is placed around your belly. The baby is made to move, and its heart rate is monitored during movement.  What is false labor? False labor is a condition in which you feel small, irregular tightenings of the muscles in the womb (contractions) that usually go away with rest, changing position, or drinking water. These are called Braxton Hicks contractions. Contractions may last for hours, days, or even weeks before true labor sets in. If contractions come at regular intervals, become more frequent, increase in intensity, or become painful, you should see your health care provider. What are the signs of labor?  Abdominal cramps.  Regular contractions that start at 10 minutes apart and become stronger and more frequent with time.  Contractions that start on the top of the uterus and spread down to the lower abdomen and back.  Increased pelvic pressure and dull back pain.  A watery or bloody mucus discharge that comes from the vagina.  Leaking of amniotic fluid. This is also known as your "water breaking." It could be a slow trickle or a gush. Let your health care provider know if it has a color or strange odor. If you have any of these signs, call your health care provider right away, even if it is before your due date. Follow these instructions at home: Medicines  Follow your health care provider's instructions regarding medicine use. Specific medicines may be either safe or unsafe to take during pregnancy.  Take a prenatal vitamin that contains at least 600 micrograms (mcg) of folic acid.  If you develop constipation, try taking a stool softener if your health care provider approves. Eating and drinking  Eat a balanced diet that includes fresh fruits and vegetables, whole grains, good sources of protein  such as meat, eggs, or tofu, and low-fat dairy. Your health care provider will help you determine the amount of weight gain that is right for you.  Avoid raw meat and uncooked cheese. These carry germs that can cause birth defects in the baby.  If you have low calcium intake from food, talk to your health care provider about whether you should take a daily calcium supplement.  Eat four or five small meals rather than three large meals a day.  Limit foods that are high in fat and processed sugars, such as fried and sweet foods.  To prevent constipation: ? Drink enough fluid to keep your urine clear or pale yellow. ? Eat foods that are high in fiber, such as fresh fruits and vegetables, whole grains, and beans. Activity  Exercise only as directed by your health care provider. Most women can continue their usual exercise routine during pregnancy. Try to exercise for 30 minutes at least 5 days a week. Stop exercising if you experience uterine contractions.  Avoid   heavy lifting.  Do not exercise in extreme heat or humidity, or at high altitudes.  Wear low-heel, comfortable shoes.  Practice good posture.  You may continue to have sex unless your health care provider tells you otherwise. Relieving pain and discomfort  Take frequent breaks and rest with your legs elevated if you have leg cramps or low back pain.  Take warm sitz baths to soothe any pain or discomfort caused by hemorrhoids. Use hemorrhoid cream if your health care provider approves.  Wear a good support bra to prevent discomfort from breast tenderness.  If you develop varicose veins: ? Wear support pantyhose or compression stockings as told by your healthcare provider. ? Elevate your feet for 15 minutes, 3-4 times a day. Prenatal care  Write down your questions. Take them to your prenatal visits.  Keep all your prenatal visits as told by your health care provider. This is important. Safety  Wear your seat belt at  all times when driving.  Make a list of emergency phone numbers, including numbers for family, friends, the hospital, and police and fire departments. General instructions  Avoid cat litter boxes and soil used by cats. These carry germs that can cause birth defects in the baby. If you have a cat, ask someone to clean the litter box for you.  Do not travel far distances unless it is absolutely necessary and only with the approval of your health care provider.  Do not use hot tubs, steam rooms, or saunas.  Do not drink alcohol.  Do not use any products that contain nicotine or tobacco, such as cigarettes and e-cigarettes. If you need help quitting, ask your health care provider.  Do not use any medicinal herbs or unprescribed drugs. These chemicals affect the formation and growth of the baby.  Do not douche or use tampons or scented sanitary pads.  Do not cross your legs for long periods of time.  To prepare for the arrival of your baby: ? Take prenatal classes to understand, practice, and ask questions about labor and delivery. ? Make a trial run to the hospital. ? Visit the hospital and tour the maternity area. ? Arrange for maternity or paternity leave through employers. ? Arrange for family and friends to take care of pets while you are in the hospital. ? Purchase a rear-facing car seat and make sure you know how to install it in your car. ? Pack your hospital bag. ? Prepare the baby's nursery. Make sure to remove all pillows and stuffed animals from the baby's crib to prevent suffocation.  Visit your dentist if you have not gone during your pregnancy. Use a soft toothbrush to brush your teeth and be gentle when you floss. Contact a health care provider if:  You are unsure if you are in labor or if your water has broken.  You become dizzy.  You have mild pelvic cramps, pelvic pressure, or nagging pain in your abdominal area.  You have lower back pain.  You have persistent  nausea, vomiting, or diarrhea.  You have an unusual or bad smelling vaginal discharge.  You have pain when you urinate. Get help right away if:  Your water breaks before 37 weeks.  You have regular contractions less than 5 minutes apart before 37 weeks.  You have a fever.  You are leaking fluid from your vagina.  You have spotting or bleeding from your vagina.  You have severe abdominal pain or cramping.  You have rapid weight loss or weight   gain.  You have shortness of breath with chest pain.  You notice sudden or extreme swelling of your face, hands, ankles, feet, or legs.  Your baby makes fewer than 10 movements in 2 hours.  You have severe headaches that do not go away when you take medicine.  You have vision changes. Summary  The third trimester is from week 28 through week 40, months 7 through 9. The third trimester is a time when the unborn baby (fetus) is growing rapidly.  During the third trimester, your discomfort may increase as you and your baby continue to gain weight. You may have abdominal, leg, and back pain, sleeping problems, and an increased need to urinate.  During the third trimester your breasts will keep growing and they will continue to become tender. A yellow fluid (colostrum) may leak from your breasts. This is the first milk you are producing for your baby.  False labor is a condition in which you feel small, irregular tightenings of the muscles in the womb (contractions) that eventually go away. These are called Braxton Hicks contractions. Contractions may last for hours, days, or even weeks before true labor sets in.  Signs of labor can include: abdominal cramps; regular contractions that start at 10 minutes apart and become stronger and more frequent with time; watery or bloody mucus discharge that comes from the vagina; increased pelvic pressure and dull back pain; and leaking of amniotic fluid. This information is not intended to replace advice  given to you by your health care provider. Make sure you discuss any questions you have with your health care provider. Document Released: 09/19/2001 Document Revised: 03/02/2016 Document Reviewed: 11/26/2012 [employer] Interactive Patient Education  2017 [employer]   Breastfeeding Deciding to breastfeed is one of the best choices you can make for you and your baby. A change in hormones during pregnancy causes your breast tissue to grow and increases the number and size of your milk ducts. These hormones also allow proteins, sugars, and fats from your blood supply to make breast milk in your milk-producing glands. Hormones prevent breast milk from being released before your baby is born as well as prompt milk flow after birth. Once breastfeeding has begun, thoughts of your baby, as well as his or her sucking or crying, can stimulate the release of milk from your milk-producing glands. Benefits of breastfeeding For Your Baby  Your first milk (colostrum) helps your baby's digestive system function better.  There are antibodies in your milk that help your baby fight off infections.  Your baby has a lower incidence of asthma, allergies, and sudden infant death syndrome.  The nutrients in breast milk are better for your baby than infant formulas and are designed uniquely for your baby's needs.  Breast milk improves your baby's brain development.  Your baby is less likely to develop other conditions, such as childhood obesity, asthma, or type 2 diabetes mellitus.  For You  Breastfeeding helps to create a very special bond between you and your baby.  Breastfeeding is convenient. Breast milk is always available at the correct temperature and costs nothing.  Breastfeeding helps to burn calories and helps you lose the weight gained during pregnancy.  Breastfeeding makes your uterus contract to its prepregnancy size faster and slows bleeding (lochia) after you give birth.  Breastfeeding helps  to lower your risk of developing type 2 diabetes mellitus, osteoporosis, and breast or ovarian cancer later in life.  Signs that your baby is hungry Early Signs of Hunger    Increased alertness or activity.  Stretching.  Movement of the head from side to side.  Movement of the head and opening of the mouth when the corner of the mouth or cheek is stroked (rooting).  Increased sucking sounds, smacking lips, cooing, sighing, or squeaking.  Hand-to-mouth movements.  Increased sucking of fingers or hands.  Late Signs of Hunger  Fussing.  Intermittent crying.  Extreme Signs of Hunger Signs of extreme hunger will require calming and consoling before your baby will be able to breastfeed successfully. Do not wait for the following signs of extreme hunger to occur before you initiate breastfeeding:  Restlessness.  A loud, strong cry.  Screaming.  Breastfeeding basics Breastfeeding Initiation  Find a comfortable place to sit or lie down, with your neck and back well supported.  Place a pillow or rolled up blanket under your baby to bring him or her to the level of your breast (if you are seated). Nursing pillows are specially designed to help support your arms and your baby while you breastfeed.  Make sure that your baby's abdomen is facing your abdomen.  Gently massage your breast. With your fingertips, massage from your chest wall toward your nipple in a circular motion. This encourages milk flow. You may need to continue this action during the feeding if your milk flows slowly.  Support your breast with 4 fingers underneath and your thumb above your nipple. Make sure your fingers are well away from your nipple and your baby's mouth.  Stroke your baby's lips gently with your finger or nipple.  When your baby's mouth is open wide enough, quickly bring your baby to your breast, placing your entire nipple and as much of the colored area around your nipple (areola) as possible into  your baby's mouth. ? More areola should be visible above your baby's upper lip than below the lower lip. ? Your baby's tongue should be between his or her lower gum and your breast.  Ensure that your baby's mouth is correctly positioned around your nipple (latched). Your baby's lips should create a seal on your breast and be turned out (everted).  It is common for your baby to suck about 2-3 minutes in order to start the flow of breast milk.  Latching Teaching your baby how to latch on to your breast properly is very important. An improper latch can cause nipple pain and decreased milk supply for you and poor weight gain in your baby. Also, if your baby is not latched onto your nipple properly, he or she may swallow some air during feeding. This can make your baby fussy. Burping your baby when you switch breasts during the feeding can help to get rid of the air. However, teaching your baby to latch on properly is still the best way to prevent fussiness from swallowing air while breastfeeding. Signs that your baby has successfully latched on to your nipple:  Silent tugging or silent sucking, without causing you pain.  Swallowing heard between every 3-4 sucks.  Muscle movement above and in front of his or her ears while sucking.  Signs that your baby has not successfully latched on to nipple:  Sucking sounds or smacking sounds from your baby while breastfeeding.  Nipple pain.  If you think your baby has not latched on correctly, slip your finger into the corner of your baby's mouth to break the suction and place it between your baby's gums. Attempt breastfeeding initiation again. Signs of Successful Breastfeeding Signs from your baby:  A   gradual decrease in the number of sucks or complete cessation of sucking.  Falling asleep.  Relaxation of his or her body.  Retention of a small amount of milk in his or her mouth.  Letting go of your breast by himself or herself.  Signs from  you:  Breasts that have increased in firmness, weight, and size 1-3 hours after feeding.  Breasts that are softer immediately after breastfeeding.  Increased milk volume, as well as a change in milk consistency and color by the fifth day of breastfeeding.  Nipples that are not sore, cracked, or bleeding.  Signs That Your Baby is Getting Enough Milk  Wetting at least 1-2 diapers during the first 24 hours after birth.  Wetting at least 5-6 diapers every 24 hours for the first week after birth. The urine should be clear or pale yellow by 5 days after birth.  Wetting 6-8 diapers every 24 hours as your baby continues to grow and develop.  At least 3 stools in a 24-hour period by age 5 days. The stool should be soft and yellow.  At least 3 stools in a 24-hour period by age 7 days. The stool should be seedy and yellow.  No loss of weight greater than 10% of birth weight during the first 3 days of age.  Average weight gain of 4-7 ounces (113-198 g) per week after age 4 days.  Consistent daily weight gain by age 5 days, without weight loss after the age of 2 weeks.  After a feeding, your baby may spit up a small amount. This is common. Breastfeeding frequency and duration Frequent feeding will help you make more milk and can prevent sore nipples and breast engorgement. Breastfeed when you feel the need to reduce the fullness of your breasts or when your baby shows signs of hunger. This is called "breastfeeding on demand." Avoid introducing a pacifier to your baby while you are working to establish breastfeeding (the first 4-6 weeks after your baby is born). After this time you may choose to use a pacifier. Research has shown that pacifier use during the first year of a baby's life decreases the risk of sudden infant death syndrome (SIDS). Allow your baby to feed on each breast as long as he or she wants. Breastfeed until your baby is finished feeding. When your baby unlatches or falls asleep  while feeding from the first breast, offer the second breast. Because newborns are often sleepy in the first few weeks of life, you may need to awaken your baby to get him or her to feed. Breastfeeding times will vary from baby to baby. However, the following rules can serve as a guide to help you ensure that your baby is properly fed:  Newborns (babies 4 weeks of age or younger) may breastfeed every 1-3 hours.  Newborns should not go longer than 3 hours during the day or 5 hours during the night without breastfeeding.  You should breastfeed your baby a minimum of 8 times in a 24-hour period until you begin to introduce solid foods to your baby at around 6 months of age.  Breast milk pumping Pumping and storing breast milk allows you to ensure that your baby is exclusively fed your breast milk, even at times when you are unable to breastfeed. This is especially important if you are going back to work while you are still breastfeeding or when you are not able to be present during feedings. Your lactation consultant can give you guidelines on how   long it is safe to store breast milk. A breast pump is a machine that allows you to pump milk from your breast into a sterile bottle. The pumped breast milk can then be stored in a refrigerator or freezer. Some breast pumps are operated by hand, while others use electricity. Ask your lactation consultant which type will work best for you. Breast pumps can be purchased, but some hospitals and breastfeeding support groups lease breast pumps on a monthly basis. A lactation consultant can teach you how to hand express breast milk, if you prefer not to use a pump. Caring for your breasts while you breastfeed Nipples can become dry, cracked, and sore while breastfeeding. The following recommendations can help keep your breasts moisturized and healthy:  Avoid using soap on your nipples.  Wear a supportive bra. Although not required, special nursing bras and tank  tops are designed to allow access to your breasts for breastfeeding without taking off your entire bra or top. Avoid wearing underwire-style bras or extremely tight bras.  Air dry your nipples for 3-4minutes after each feeding.  Use only cotton bra pads to absorb leaked breast milk. Leaking of breast milk between feedings is normal.  Use lanolin on your nipples after breastfeeding. Lanolin helps to maintain your skin's normal moisture barrier. If you use pure lanolin, you do not need to wash it off before feeding your baby again. Pure lanolin is not toxic to your baby. You may also hand express a few drops of breast milk and gently massage that milk into your nipples and allow the milk to air dry.  In the first few weeks after giving birth, some women experience extremely full breasts (engorgement). Engorgement can make your breasts feel heavy, warm, and tender to the touch. Engorgement peaks within 3-5 days after you give birth. The following recommendations can help ease engorgement:  Completely empty your breasts while breastfeeding or pumping. You may want to start by applying warm, moist heat (in the shower or with warm water-soaked hand towels) just before feeding or pumping. This increases circulation and helps the milk flow. If your baby does not completely empty your breasts while breastfeeding, pump any extra milk after he or she is finished.  Wear a snug bra (nursing or regular) or tank top for 1-2 days to signal your body to slightly decrease milk production.  Apply ice packs to your breasts, unless this is too uncomfortable for you.  Make sure that your baby is latched on and positioned properly while breastfeeding.  If engorgement persists after 48 hours of following these recommendations, contact your health care provider or a lactation consultant. Overall health care recommendations while breastfeeding  Eat healthy foods. Alternate between meals and snacks, eating 3 of each per  day. Because what you eat affects your breast milk, some of the foods may make your baby more irritable than usual. Avoid eating these foods if you are sure that they are negatively affecting your baby.  Drink milk, fruit juice, and water to satisfy your thirst (about 10 glasses a day).  Rest often, relax, and continue to take your prenatal vitamins to prevent fatigue, stress, and anemia.  Continue breast self-awareness checks.  Avoid chewing and smoking tobacco. Chemicals from cigarettes that pass into breast milk and exposure to secondhand smoke may harm your baby.  Avoid alcohol and drug use, including marijuana. Some medicines that may be harmful to your baby can pass through breast milk. It is important to ask your health care   provider before taking any medicine, including all over-the-counter and prescription medicine as well as vitamin and herbal supplements. It is possible to become pregnant while breastfeeding. If birth control is desired, ask your health care provider about options that will be safe for your baby. Contact a health care provider if:  You feel like you want to stop breastfeeding or have become frustrated with breastfeeding.  You have painful breasts or nipples.  Your nipples are cracked or bleeding.  Your breasts are red, tender, or warm.  You have a swollen area on either breast.  You have a fever or chills.  You have nausea or vomiting.  You have drainage other than breast milk from your nipples.  Your breasts do not become full before feedings by the fifth day after you give birth.  You feel sad and depressed.  Your baby is too sleepy to eat well.  Your baby is having trouble sleeping.  Your baby is wetting less than 3 diapers in a 24-hour period.  Your baby has less than 3 stools in a 24-hour period.  Your baby's skin or the white part of his or her eyes becomes yellow.  Your baby is not gaining weight by 5 days of age. Get help right away  if:  Your baby is overly tired (lethargic) and does not want to wake up and feed.  Your baby develops an unexplained fever. This information is not intended to replace advice given to you by your health care provider. Make sure you discuss any questions you have with your health care provider. Document Released: 09/25/2005 Document Revised: 03/08/2016 Document Reviewed: 03/19/2013 Elsevier Interactive Patient Education  2017 [employer]  

## 2017-05-24 LAB — URINE CULTURE, OB REFLEX

## 2017-05-24 LAB — CULTURE, OB URINE

## 2017-05-26 LAB — CHLAMYDIA/GONOCOCCUS/TRICHOMONAS, NAA
Chlamydia by NAA: POSITIVE — AB
GONOCOCCUS BY NAA: NEGATIVE
TRICH VAG BY NAA: NEGATIVE

## 2017-05-28 ENCOUNTER — Telehealth: Payer: Self-pay

## 2017-05-28 ENCOUNTER — Encounter: Payer: Self-pay | Admitting: Family Medicine

## 2017-05-28 DIAGNOSIS — A749 Chlamydial infection, unspecified: Secondary | ICD-10-CM | POA: Insufficient documentation

## 2017-05-28 DIAGNOSIS — O98812 Other maternal infectious and parasitic diseases complicating pregnancy, second trimester: Secondary | ICD-10-CM

## 2017-05-28 MED ORDER — AZITHROMYCIN 500 MG PO TABS: 1000.0000 mg | ORAL_TABLET | Freq: Once | ORAL | 0 refills | Status: AC

## 2017-05-28 NOTE — Telephone Encounter (Signed)
Left message for patient to call office regarding lab results and recommendations.  Azithromycin sent to pharmacy.

## 2017-05-28 NOTE — Telephone Encounter (Signed)
-----   Message from Reva Bores, MD sent at 05/28/2017  4:53 PM EDT ----- Has chlamydia--needs azithromycin 1 gm and treatment for partner.

## 2017-05-29 ENCOUNTER — Encounter (HOSPITAL_COMMUNITY): Payer: Self-pay

## 2017-05-29 ENCOUNTER — Ambulatory Visit (HOSPITAL_COMMUNITY)
Admission: RE | Admit: 2017-05-29 | Discharge: 2017-05-29 | Disposition: A | Discharge status: Home / Self Care | Payer: Medicaid Other | Source: Ambulatory Visit | Attending: Family Medicine | Admitting: Family Medicine

## 2017-05-29 ENCOUNTER — Other Ambulatory Visit: Payer: Self-pay

## 2017-05-29 ENCOUNTER — Other Ambulatory Visit (HOSPITAL_COMMUNITY): Payer: Self-pay | Admitting: *Deleted

## 2017-05-29 ENCOUNTER — Other Ambulatory Visit (HOSPITAL_COMMUNITY): Payer: Self-pay | Admitting: Maternal and Fetal Medicine

## 2017-05-29 DIAGNOSIS — O09892 Supervision of other high risk pregnancies, second trimester: Secondary | ICD-10-CM | POA: Diagnosis not present

## 2017-05-29 DIAGNOSIS — O30032 Twin pregnancy, monochorionic/diamniotic, second trimester: Secondary | ICD-10-CM | POA: Insufficient documentation

## 2017-05-29 DIAGNOSIS — O30039 Twin pregnancy, monochorionic/diamniotic, unspecified trimester: Secondary | ICD-10-CM

## 2017-05-29 DIAGNOSIS — Z3A27 27 weeks gestation of pregnancy: Secondary | ICD-10-CM | POA: Diagnosis not present

## 2017-05-29 MED ORDER — AZITHROMYCIN 500 MG PO TABS: ORAL_TABLET | ORAL | 0 refills | Status: DC

## 2017-05-29 NOTE — Telephone Encounter (Signed)
Informed patient of chlamydia results.  She has taken the medication.  Refill sent to pharmacy for partner.

## 2017-06-04 ENCOUNTER — Ambulatory Visit (INDEPENDENT_AMBULATORY_CARE_PROVIDER_SITE_OTHER): Payer: Medicaid Other | Admitting: *Deleted

## 2017-06-04 ENCOUNTER — Encounter: Payer: Self-pay | Admitting: *Deleted

## 2017-06-04 DIAGNOSIS — Z23 Encounter for immunization: Secondary | ICD-10-CM

## 2017-06-04 DIAGNOSIS — O0992 Supervision of high risk pregnancy, unspecified, second trimester: Secondary | ICD-10-CM | POA: Diagnosis not present

## 2017-06-04 NOTE — Progress Notes (Signed)
Pt here for 28wk labs and TDAP No Complaints.  She has completed abx for +Chlamydia

## 2017-06-05 LAB — CBC
HEMATOCRIT: 32.5 % — AB (ref 34.0–46.6)
Hemoglobin: 10.6 g/dL — ABNORMAL LOW (ref 11.1–15.9)
MCH: 30.2 pg (ref 26.6–33.0)
MCHC: 32.6 g/dL (ref 31.5–35.7)
MCV: 93 fL (ref 79–97)
Platelets: 252 10*3/uL (ref 150–379)
RBC: 3.51 x10E6/uL — ABNORMAL LOW (ref 3.77–5.28)
RDW: 14.3 % (ref 12.3–15.4)
WBC: 8.7 10*3/uL (ref 3.4–10.8)

## 2017-06-05 LAB — HIV ANTIBODY (ROUTINE TESTING W REFLEX): HIV Screen 4th Generation wRfx: NONREACTIVE

## 2017-06-05 LAB — GLUCOSE TOLERANCE, 2 HOURS W/ 1HR
GLUCOSE, 1 HOUR: 155 mg/dL (ref 65–179)
GLUCOSE, 2 HOUR: 118 mg/dL (ref 65–152)
GLUCOSE, FASTING: 70 mg/dL (ref 65–91)

## 2017-06-05 LAB — RPR: RPR Ser Ql: NONREACTIVE

## 2017-06-07 ENCOUNTER — Encounter: Payer: Medicaid Other | Admitting: Obstetrics and Gynecology

## 2017-06-08 ENCOUNTER — Encounter: Payer: Self-pay | Admitting: Radiology

## 2017-06-08 ENCOUNTER — Ambulatory Visit (INDEPENDENT_AMBULATORY_CARE_PROVIDER_SITE_OTHER): Payer: Medicaid Other | Admitting: Obstetrics & Gynecology

## 2017-06-08 VITALS — BP 134/78 | HR 101 | Wt 166.2 lb

## 2017-06-08 DIAGNOSIS — O98813 Other maternal infectious and parasitic diseases complicating pregnancy, third trimester: Secondary | ICD-10-CM

## 2017-06-08 DIAGNOSIS — O0993 Supervision of high risk pregnancy, unspecified, third trimester: Secondary | ICD-10-CM | POA: Diagnosis not present

## 2017-06-08 DIAGNOSIS — O30039 Twin pregnancy, monochorionic/diamniotic, unspecified trimester: Secondary | ICD-10-CM

## 2017-06-08 DIAGNOSIS — O98812 Other maternal infectious and parasitic diseases complicating pregnancy, second trimester: Secondary | ICD-10-CM

## 2017-06-08 DIAGNOSIS — O30033 Twin pregnancy, monochorionic/diamniotic, third trimester: Secondary | ICD-10-CM

## 2017-06-08 DIAGNOSIS — A749 Chlamydial infection, unspecified: Secondary | ICD-10-CM

## 2017-06-08 NOTE — Progress Notes (Signed)
PRENATAL VISIT NOTE  Subjective:  Sharon Chandler is a 26 y.o. G3P2002 at [redacted]w[redacted]d being seen today for ongoing prenatal care.  She is currently monitored for the following issues for this high-risk pregnancy and has Supervision of high-risk pregnancy; History of postpartum depression, currently pregnant; Short interval between pregnancies affecting pregnancy, antepartum; Monochorionic diamniotic twin pregnancy, antepartum; Hydronephrosis; Chlamydia infection affecting pregnancy in second trimester, antepartum; Bilateral pulmonary contusion; Fracture of malleolus of right ankle; and LOC (loss of consciousness) (HCC) on her problem list.  Patient reports no complaints.  Contractions: Not present. Vag. Bleeding: None.  Movement: Present. Denies leaking of fluid.   The following portions of the patient's history were reviewed and updated as appropriate: allergies, current medications, past family history, past medical history, past social history, past surgical history and problem list. Problem list updated.  Objective:   Vitals:   06/08/17 1015  BP: 134/78  Pulse: (!) 101  Weight: 166 lb 3.2 oz (75.4 kg)    Fetal Status: Fetal Heart Rate (bpm): 150/146   Movement: Present     General:  Alert, oriented and cooperative. Patient is in no acute distress.  Skin: Skin is warm and dry. No rash noted.   Cardiovascular: Normal heart rate noted  Respiratory: Normal respiratory effort, no problems with respiration noted  Abdomen: Soft, gravid, appropriate for gestational age.  Pain/Pressure: Absent     Pelvic: Cervical exam deferred        Extremities: Normal range of motion.  Edema: Mild pitting, slight indentation  Mental Status:  Normal mood and affect. Normal behavior. Normal judgment and thought content.   Results for orders placed or performed in visit on 06/04/17 (from the past 504 hour(s))  CBC   Collection Time: 06/04/17  8:41 AM  Result Value Ref Range   WBC 8.7 3.4 - 10.8  x10E3/uL   RBC 3.51 (L) 3.77 - 5.28 x10E6/uL   Hemoglobin 10.6 (L) 11.1 - 15.9 g/dL   Hematocrit 16.1 (L) 09.6 - 46.6 %   MCV 93 79 - 97 fL   MCH 30.2 26.6 - 33.0 pg   MCHC 32.6 31.5 - 35.7 g/dL   RDW 04.5 40.9 - 81.1 %   Platelets 252 150 - 379 x10E3/uL  Glucose Tolerance, 2 Hours w/1 Hour   Collection Time: 06/04/17  8:41 AM  Result Value Ref Range   Glucose, Fasting 70 65 - 91 mg/dL   Glucose, 1 hour 914 65 - 179 mg/dL   Glucose, 2 hour 782 65 - 152 mg/dL  HIV antibody   Collection Time: 06/04/17  8:41 AM  Result Value Ref Range   HIV Screen 4th Generation wRfx Non Reactive Non Reactive  RPR   Collection Time: 06/04/17  8:41 AM  Result Value Ref Range   RPR Ser Ql Non Reactive Non Reactive  Results for orders placed or performed in visit on 05/22/17 (from the past 504 hour(s))  Chlamydia/Gonococcus/Trichomonas, NAA   Collection Time: 05/22/17 12:00 AM  Result Value Ref Range   Chlamydia by NAA Positive (A) Negative   Gonococcus by NAA Negative Negative   Trich vag by NAA Negative Negative  Results for orders placed or performed in visit on 05/22/17 (from the past 504 hour(s))  Culture, OB Urine   Collection Time: 05/22/17 10:50 AM  Result Value Ref Range   Urine Culture, OB Final report   Urine Culture, OB Reflex   Collection Time: 05/22/17 10:50 AM  Result Value Ref Range   Organism ID,  Bacteria Comment    [place] Mfm Ob Follow Up  Result Date: 05/30/2017 ----------------------------------------------------------------------  OBSTETRICS REPORT                        (Corrected Final 05/30/2017 11:59                                                                          am) ---------------------------------------------------------------------- Patient Info  ID #:       [id number]                         D.O.B.:   1991-04-27 (26 yrs)  Name:       Sharon Chandler           Visit Date:  05/29/2017 10:10 am ----------------------------------------------------------------------  Performed By  Performed By:     Tommi Emery         Ref. Address:     [address]                                                             [place], [place]                                                             [address]  Attending:        Particia Nearing MD       Location:         [facility]  Referred By:      Tereso Newcomer MD ---------------------------------------------------------------------- Orders   #  Description                                 Code   1  [place] MFM OB FOLLOW UP                         [phone]   2  [place] MFM OB FOLLOW UP ADDL GEST               95621.30  ----------------------------------------------------------------------   #  Ordered By  Order #        Accession #    Episode #   1  Alpha Gula            [id number]      [id number]     [id number]   2  Alpha Gula            [id number]      [id number]     [id number]  ---------------------------------------------------------------------- Indications   [redacted] weeks gestation of pregnancy                Z3A.27   Twin pregnancy, mono/di, second trimester;     O30.032   ECHOs pending 08/24   Short interval between pregancies, 2nd         O09.892   trimester  ---------------------------------------------------------------------- OB History  Blood Type:            Height:  5'5"   Weight (lb):  140      BMI:   23.29  Gravidity:    3         Term:   2  Living:       2 ---------------------------------------------------------------------- Fetal Evaluation (Fetus A)  Num Of Fetuses:     2  Cardiac Activity:   Observed  Fetal Lie:          Lower Left Fetus  Presentation:       Breech  Placenta:           Posterior, above cervical os  P. Cord Insertion:  Visualized  Membrane Desc:      Dividing Membrane seen  Amniotic Fluid  AFI FV:      Subjectively within normal limits                              Largest Pocket(cm)                               4.2 ---------------------------------------------------------------------- Biometry (Fetus A)  BPD:      65.2  mm     G. Age:  26w 3d         18  %    CI:        78.66   %   70 - 86                                                          FL/HC:      21.9   %   18.6 - 20.4  HC:      232.5  mm     G. Age:  25w 2d        < 3  %    HC/AC:      1.01       1.05 - 1.21  AC:      229.9  mm     G. Age:  27w 3d         53  %    FL/BPD:     78.2   %   71 - 87  FL:         51  mm     G. Age:  27w 2d  45  %    FL/AC:      22.2   %   20 - 24  HUM:      45.1  mm     G. Age:  26w 5d         40  %  CER:      31.5  mm     G. Age:  27w 4d         58  %  Est. FW:    1022  gm      2 lb 4 oz     55  %     FW Discordancy         4  % ---------------------------------------------------------------------- Gestational Age (Fetus A)  LMP:           27w 0d       Date:   11/21/16                 EDD:   08/28/17  U/S Today:     26w 4d                                        EDD:   08/31/17  Best:          27w 0d    Det. By:   LMP  (11/21/16)          EDD:   08/28/17 ---------------------------------------------------------------------- Anatomy (Fetus A)  Cranium:               Appears normal         Aortic Arch:            Previously seen  Cavum:                 Appears normal         Ductal Arch:            Previously seen  Ventricles:            Appears normal         Diaphragm:              Previously seen  Choroid Plexus:        Previously seen        Stomach:                Appears normal, left                                                                        sided  Cerebellum:            Appears normal         Abdomen:                Appears normal  Posterior Fossa:       Previously seen        Abdominal Wall:         Previously seen  Nuchal Fold:           Previously seen        Cord Vessels:  Appears normal (3                                                                        vessel cord)  Face:                   Orbits and profile     Kidneys:                Appear normal                         previously seen  Lips:                  Previously seen        Bladder:                Appears normal  Thoracic:              Appears normal         Spine:                  Previously seen  Heart:                 Appears normal         Upper Extremities:      Previously seen                         (4CH, axis, and                         situs)  RVOT:                  Previously seen        Lower Extremities:      Previously seen  LVOT:                  Previously seen  Other:  Female gender. Heels and Nasal bone previously visualized.          Technically difficult due to fetal position. ---------------------------------------------------------------------- Fetal Evaluation (Fetus B)  Num Of Fetuses:     2  Fetal Heart         150  Rate(bpm):  Cardiac Activity:   Observed  Fetal Lie:          Upper Right Fetus  Presentation:       Cephalic  Placenta:           Posterior, above cervical os  P. Cord Insertion:  Visualized, central  Membrane Desc:      Dividing Membrane seen  Amniotic Fluid  AFI FV:      Subjectively within normal limits                              Largest Pocket(cm)                              5.2  Comment:    Stomach and bladder visualized. ---------------------------------------------------------------------- Biometry (Fetus B)  BPD:      66.4  mm     G. Age:  26w 5d         31  %    CI:        77.42   %   70 - 86                                                          FL/HC:      21.7   %   18.6 - 20.4  HC:      238.9  mm     G. Age:  26w 0d          5  %    HC/AC:      1.03       1.05 - 1.21  AC:      231.1  mm     G. Age:  27w 4d         56  %    FL/BPD:     78.0   %   71 - 87  FL:       51.8  mm     G. Age:  27w 4d         55  %    FL/AC:      22.4   %   20 - 24  HUM:      46.1  mm     G. Age:  27w 1d         51  %  CER:      32.6  mm     G. Age:  28w 4d         74  %  Est. FW:    1060  gm       2 lb 5 oz     60  %     FW Discordancy      0 \ 4 % ---------------------------------------------------------------------- Gestational Age (Fetus B)  LMP:           27w 0d       Date:   11/21/16                 EDD:   08/28/17  U/S Today:     27w 0d                                        EDD:   08/28/17  Best:          27w 0d    Det. By:   LMP  (11/21/16)          EDD:   08/28/17 ---------------------------------------------------------------------- Anatomy (Fetus B)  Cranium:               Appears normal         Aortic Arch:            Previously seen  Cavum:                 Appears normal         Ductal Arch:            Previously seen  Ventricles:            Appears normal  Diaphragm:              Previously seen  Choroid Plexus:        Previously seen        Stomach:                Appears normal, left                                                                        sided  Cerebellum:            Appears normal         Abdomen:                Appears normal  Posterior Fossa:       Appears normal         Abdominal Wall:         Previously seen  Nuchal Fold:           Previously seen        Cord Vessels:           Appears normal (3                                                                        vessel cord)  Face:                  Orbits and profile     Kidneys:                Appear normal                         previously seen  Lips:                  Previously seen        Bladder:                Appears normal  Thoracic:              Appears normal         Spine:                  Previously seen  Heart:                 Appears normal         Upper Extremities:      Previously seen                         (4CH, axis, and                         situs)  RVOT:                  Appears normal         Lower Extremities:      Previously seen  LVOT:  Appears normal  Other:  Female gender. Heels, 5th digit, and Nasal bone previously          visualized. Technically difficult due  to fetal position. ---------------------------------------------------------------------- Cervix Uterus Adnexa  Cervix  Length:            3.2  cm.  Normal appearance by transabdominal scan.  Uterus  Normal shape and size.  Left Ovary  Size(cm)       3.5 x    1.1    x  1.9       Vol(ml): 3.8  Within normal limits.  Right Ovary  Size(cm)       3.2 x    2.5    x  3         Vol(ml): 12.6  Within normal limits.  Cul De Sac:   No free fluid seen.  Adnexa:       No abnormality visualized. ---------------------------------------------------------------------- Impression  Monochorionic/diamniotic twin pregnancy at 27+0 weeks  Normal interval anatomy x 2; anatomic survey complete x 2  Normal amniotic fluid volume x 2  Appropriate interval growths with EFWs at the 55th and 60th  %tiles ---------------------------------------------------------------------- Recommendations  TTTS check in 2 weeks  Growth [place] in 4 weeks ----------------------------------------------------------------------               Fayne Norrie, BS, RDMS, RVT Electronically Signed Corrected Final Report  05/30/2017 11:59 am ----------------------------------------------------------------------  [place] Mfm Ob Follow Up Addl Gest  Result Date: 05/30/2017 ----------------------------------------------------------------------  OBSTETRICS REPORT                        (Corrected Final 05/30/2017 11:59                                                                          am) ---------------------------------------------------------------------- Patient Info  ID #:       [id number]                         D.O.B.:   26-Nov-1990 (26 yrs)  Name:       Sharon Chandler           Visit Date:  05/29/2017 10:10 am ---------------------------------------------------------------------- Performed By  Performed By:     Tommi Emery         Ref. Address:     [address]  [place], [place]                                                             [address]  Attending:        Particia Nearing MD       Location:         [facility]  Referred By:      Tereso Newcomer MD ---------------------------------------------------------------------- Orders   #  Description                                 Code   1  [place] MFM OB FOLLOW UP                         [id number]   2  [place] MFM OB FOLLOW UP ADDL GEST               81191.47  ----------------------------------------------------------------------   #  Ordered By               Order #        Accession #    Episode #   1  Alpha Gula            [id number]      [id number]     [id number]   2  [facility]            [id number]      [id number]     [id number]  ---------------------------------------------------------------------- Indications   [redacted] weeks gestation of pregnancy                Z3A.27   Twin pregnancy, mono/di, second trimester;     O30.032   ECHOs pending 08/24   Short interval between pregancies, 2nd         O09.892   trimester  ---------------------------------------------------------------------- OB History  Blood Type:            Height:  5'5"   Weight (lb):  140      BMI:   23.29  Gravidity:    3         Term:   2  Living:       2 ---------------------------------------------------------------------- Fetal Evaluation (Fetus A)  Num Of Fetuses:     2  Cardiac Activity:   Observed  Fetal Lie:          Lower Left Fetus  Presentation:       Breech  Placenta:           Posterior, above cervical os  P. Cord Insertion:  Visualized  Membrane Desc:      Dividing Membrane seen  Amniotic Fluid  AFI FV:      Subjectively within normal limits                              Largest Pocket(cm)                              4.2 ---------------------------------------------------------------------- Biometry (Fetus  A)  BPD:      65.2  mm     G. Age:  26w 3d         18  %    CI:        78.66    %   70 - 86                                                          FL/HC:      21.9   %   18.6 - 20.4  HC:      232.5  mm     G. Age:  25w 2d        < 3  %    HC/AC:      1.01       1.05 - 1.21  AC:      229.9  mm     G. Age:  27w 3d         53  %    FL/BPD:     78.2   %   71 - 87  FL:         51  mm     G. Age:  27w 2d         45  %    FL/AC:      22.2   %   20 - 24  HUM:      45.1  mm     G. Age:  26w 5d         40  %  CER:      31.5  mm     G. Age:  27w 4d         58  %  Est. FW:    1022  gm      2 lb 4 oz     55  %     FW Discordancy         4  % ---------------------------------------------------------------------- Gestational Age (Fetus A)  LMP:           27w 0d       Date:   11/21/16                 EDD:   08/28/17  U/S Today:     26w 4d                                        EDD:   08/31/17  Best:          27w 0d    Det. By:   LMP  (11/21/16)          EDD:   08/28/17 ---------------------------------------------------------------------- Anatomy (Fetus A)  Cranium:               Appears normal         Aortic Arch:            Previously seen  Cavum:                 Appears normal         Ductal Arch:            Previously seen  Ventricles:  Appears normal         Diaphragm:              Previously seen  Choroid Plexus:        Previously seen        Stomach:                Appears normal, left                                                                        sided  Cerebellum:            Appears normal         Abdomen:                Appears normal  Posterior Fossa:       Previously seen        Abdominal Wall:         Previously seen  Nuchal Fold:           Previously seen        Cord Vessels:           Appears normal (3                                                                        vessel cord)  Face:                  Orbits and profile     Kidneys:                Appear normal                         previously seen  Lips:                  Previously seen        Bladder:                 Appears normal  Thoracic:              Appears normal         Spine:                  Previously seen  Heart:                 Appears normal         Upper Extremities:      Previously seen                         (4CH, axis, and                         situs)  RVOT:                  Previously seen        Lower Extremities:  Previously seen  LVOT:                  Previously seen  Other:  Female gender. Heels and Nasal bone previously visualized.          Technically difficult due to fetal position. ---------------------------------------------------------------------- Fetal Evaluation (Fetus B)  Num Of Fetuses:     2  Fetal Heart         150  Rate(bpm):  Cardiac Activity:   Observed  Fetal Lie:          Upper Right Fetus  Presentation:       Cephalic  Placenta:           Posterior, above cervical os  P. Cord Insertion:  Visualized, central  Membrane Desc:      Dividing Membrane seen  Amniotic Fluid  AFI FV:      Subjectively within normal limits                              Largest Pocket(cm)                              5.2  Comment:    Stomach and bladder visualized. ---------------------------------------------------------------------- Biometry (Fetus B)  BPD:      66.4  mm     G. Age:  26w 5d         31  %    CI:        77.42   %   70 - 86                                                          FL/HC:      21.7   %   18.6 - 20.4  HC:      238.9  mm     G. Age:  26w 0d          5  %    HC/AC:      1.03       1.05 - 1.21  AC:      231.1  mm     G. Age:  27w 4d         56  %    FL/BPD:     78.0   %   71 - 87  FL:       51.8  mm     G. Age:  27w 4d         55  %    FL/AC:      22.4   %   20 - 24  HUM:      46.1  mm     G. Age:  27w 1d         51  %  CER:      32.6  mm     G. Age:  28w 4d         74  %  Est. FW:    1060  gm      2 lb 5 oz     60  %     FW Discordancy      0 \ 4 % ---------------------------------------------------------------------- Gestational  Age (Fetus B)  LMP:           27w 0d       Date:    11/21/16                 EDD:   08/28/17  U/S Today:     27w 0d                                        EDD:   08/28/17  Best:          27w 0d    Det. By:   LMP  (11/21/16)          EDD:   08/28/17 ---------------------------------------------------------------------- Anatomy (Fetus B)  Cranium:               Appears normal         Aortic Arch:            Previously seen  Cavum:                 Appears normal         Ductal Arch:            Previously seen  Ventricles:            Appears normal         Diaphragm:              Previously seen  Choroid Plexus:        Previously seen        Stomach:                Appears normal, left                                                                        sided  Cerebellum:            Appears normal         Abdomen:                Appears normal  Posterior Fossa:       Appears normal         Abdominal Wall:         Previously seen  Nuchal Fold:           Previously seen        Cord Vessels:           Appears normal (3                                                                        vessel cord)  Face:                  Orbits and profile     Kidneys:                Appear normal  previously seen  Lips:                  Previously seen        Bladder:                Appears normal  Thoracic:              Appears normal         Spine:                  Previously seen  Heart:                 Appears normal         Upper Extremities:      Previously seen                         (4CH, axis, and                         situs)  RVOT:                  Appears normal         Lower Extremities:      Previously seen  LVOT:                  Appears normal  Other:  Female gender. Heels, 5th digit, and Nasal bone previously          visualized. Technically difficult due to fetal position. ---------------------------------------------------------------------- Cervix Uterus Adnexa  Cervix  Length:            3.2  cm.  Normal appearance by transabdominal  scan.  Uterus  Normal shape and size.  Left Ovary  Size(cm)       3.5 x    1.1    x  1.9       Vol(ml): 3.8  Within normal limits.  Right Ovary  Size(cm)       3.2 x    2.5    x  3         Vol(ml): 12.6  Within normal limits.  Cul De Sac:   No free fluid seen.  Adnexa:       No abnormality visualized. ---------------------------------------------------------------------- Impression  Monochorionic/diamniotic twin pregnancy at 27+0 weeks  Normal interval anatomy x 2; anatomic survey complete x 2  Normal amniotic fluid volume x 2  Appropriate interval growths with EFWs at the 55th and 60th  %tiles ---------------------------------------------------------------------- Recommendations  TTTS check in 2 weeks  Growth [place] in 4 weeks ----------------------------------------------------------------------               Fayne Norrie, BS, RDMS, RVT Electronically Signed Corrected Final Report  05/30/2017 11:59 am ----------------------------------------------------------------------  Assessment and Plan:  Pregnancy: G3P2002 at [redacted]w[redacted]d  1. Monochorionic diamniotic twin pregnancy, antepartum Concordant growth.  Continue scans and surveillance as per MFM - US OB Limited; Future done for FHR checks  2. Chlamydia infection affecting pregnancy in second trimester, antepartum Had treatment last week; will do TOC next visit. STI prevention with condoms emphasized.  3. Supervision of high risk pregnancy in third trimester Lab results and recent imaging studies reviewed Preterm labor symptoms and general obstetric precautions including but not limited to vaginal bleeding, contractions, leaking of fluid and fetal movement were reviewed in detail with the patient. Please refer to After Visit Summary for other counseling recommendations.  Return in about  2 weeks (around 06/22/2017) for OB Visit.   Jaynie Collins, MD

## 2017-06-08 NOTE — Patient Instructions (Signed)
Return to clinic for any scheduled appointments or obstetric concerns, or go to [facility] for evaluation  

## 2017-06-12 ENCOUNTER — Other Ambulatory Visit (HOSPITAL_COMMUNITY): Payer: Self-pay | Admitting: Maternal and Fetal Medicine

## 2017-06-12 ENCOUNTER — Ambulatory Visit (HOSPITAL_COMMUNITY)
Admission: RE | Admit: 2017-06-12 | Discharge: 2017-06-12 | Disposition: A | Discharge status: Home / Self Care | Payer: Medicaid Other | Source: Ambulatory Visit | Attending: Family Medicine | Admitting: Family Medicine

## 2017-06-12 ENCOUNTER — Encounter (HOSPITAL_COMMUNITY): Payer: Self-pay

## 2017-06-12 DIAGNOSIS — O30039 Twin pregnancy, monochorionic/diamniotic, unspecified trimester: Secondary | ICD-10-CM

## 2017-06-12 DIAGNOSIS — Z3A29 29 weeks gestation of pregnancy: Secondary | ICD-10-CM

## 2017-06-12 DIAGNOSIS — O09893 Supervision of other high risk pregnancies, third trimester: Secondary | ICD-10-CM

## 2017-06-21 ENCOUNTER — Other Ambulatory Visit (HOSPITAL_COMMUNITY)
Admission: RE | Admit: 2017-06-21 | Discharge: 2017-06-21 | Disposition: A | Discharge status: Home / Self Care | Payer: Medicaid Other | Source: Ambulatory Visit | Attending: Obstetrics & Gynecology | Admitting: Obstetrics & Gynecology

## 2017-06-21 ENCOUNTER — Ambulatory Visit (INDEPENDENT_AMBULATORY_CARE_PROVIDER_SITE_OTHER): Payer: Medicaid Other | Admitting: Obstetrics & Gynecology

## 2017-06-21 VITALS — BP 150/91 | Wt 170.0 lb

## 2017-06-21 DIAGNOSIS — O163 Unspecified maternal hypertension, third trimester: Secondary | ICD-10-CM | POA: Diagnosis not present

## 2017-06-21 DIAGNOSIS — Z749 Problem related to care provider dependency, unspecified: Secondary | ICD-10-CM | POA: Diagnosis not present

## 2017-06-21 DIAGNOSIS — O98813 Other maternal infectious and parasitic diseases complicating pregnancy, third trimester: Secondary | ICD-10-CM

## 2017-06-21 DIAGNOSIS — O0993 Supervision of high risk pregnancy, unspecified, third trimester: Secondary | ICD-10-CM

## 2017-06-21 DIAGNOSIS — O30033 Twin pregnancy, monochorionic/diamniotic, third trimester: Secondary | ICD-10-CM

## 2017-06-21 DIAGNOSIS — O98812 Other maternal infectious and parasitic diseases complicating pregnancy, second trimester: Secondary | ICD-10-CM | POA: Insufficient documentation

## 2017-06-21 DIAGNOSIS — A749 Chlamydial infection, unspecified: Secondary | ICD-10-CM

## 2017-06-21 DIAGNOSIS — O30039 Twin pregnancy, monochorionic/diamniotic, unspecified trimester: Secondary | ICD-10-CM

## 2017-06-21 MED ORDER — BETAMETHASONE SOD PHOS & ACET 6 (3-3) MG/ML IJ SUSP
12.0000 mg | INTRAMUSCULAR | Status: AC
  Administered 2017-06-21 – 2017-06-22 (×2): 12 mg via INTRAMUSCULAR

## 2017-06-21 NOTE — Progress Notes (Signed)
PRENATAL VISIT NOTE  Subjective:  Sharon Chandler is a 26 y.o. G3P2002 at 22w2dbeing seen today for ongoing prenatal care.  She is currently monitored for the following issues for this high-risk pregnancy and has Supervision of high-risk pregnancy; History of postpartum depression, currently pregnant; Short interval between pregnancies affecting pregnancy, antepartum; Monochorionic diamniotic twin pregnancy, antepartum; Hydronephrosis; Chlamydia infection affecting pregnancy in second trimester, antepartum; Bilateral pulmonary contusion; Fracture of malleolus of right ankle and LOC (loss of consciousness) ([place].  Patient reports having increased edema in lower extremities and face.  Contractions: Not present. Vag. Bleeding: None.  Movement: Present. Denies leaking of fluid.   The following portions of the patient's history were reviewed and updated as appropriate: allergies, current medications, past family history, past medical history, past social history, past surgical history and problem list. Problem list updated.  Objective:   Vitals:   06/21/17 1218 06/21/17 1219  BP: (!) 146/78 (!) 150/91  Weight: 170 lb (77.1 kg)     Fetal Status: Fetal Heart Rate (bpm): 145/150   Movement: Present     General:  Alert, oriented and cooperative. Patient is in no acute distress.  Skin: Skin is warm and dry. No rash noted.   Cardiovascular: Normal heart rate noted  Respiratory: Normal respiratory effort, no problems with respiration noted  Abdomen: Soft, gravid, appropriate for gestational age.  Pain/Pressure: Absent     Pelvic: Cervical exam deferred        Extremities: Normal range of motion.  Edema: Moderate pitting, indentation subsides rapidly  Mental Status:  Normal mood and affect. Normal behavior. Normal judgment and thought content.   [place] Follow Up  Result Date: 05/30/2017 ----------------------------------------------------------------------   OBSTETRICS REPORT                        (Corrected Final 05/30/2017 11:59                                                                          am) ---------------------------------------------------------------------- Patient Info  ID #:       [id number]                        D.O.B.:   004/15/1992(26 yrs)  Name:       BBubba HalesA Chandler           Visit Date:  05/29/2017 10:10 am ---------------------------------------------------------------------- Performed By  Performed By:     ORaquel Chandler        Ref. Address:     [address]  [place], [place]  Attending:        Renella Cunas [facility]       Location:         [facility]  Referred By:      Sharon Chandler ---------------------------------------------------------------------- Orders   #  Description                                 Code   1  [place] MFM OB FOLLOW UP                         [phone]   2  [place] MFM OB FOLLOW UP ADDL GEST               09381.82  ----------------------------------------------------------------------   #  Ordered By               Order #        Accession #    Episode #   1  Sharon Chandler            [id number]      [id number]     [id number]   2  Sharon Chandler            [id number]      [id number]     [id number]  ---------------------------------------------------------------------- Indications   [redacted] weeks gestation of pregnancy                Z3A.27   Twin pregnancy, mono/di, second trimester;     O30.032   ECHOs pending 08/24   Short interval between pregancies, 2nd         O09.892   trimester  ---------------------------------------------------------------------- OB History  Blood Type:            Height:  5'5"   Weight (lb):  140      BMI:   23.29  Gravidity:    3         Term:   2  Living:       2  ---------------------------------------------------------------------- Fetal Evaluation (Fetus A)  Num Of Fetuses:     2  Cardiac Activity:   Observed  Fetal Lie:          Lower Left Fetus  Presentation:       Breech  Placenta:           Posterior, above cervical os  P. Cord Insertion:  Visualized  Membrane Desc:      Dividing Membrane seen  Amniotic Fluid  AFI FV:      Subjectively within normal limits                              Largest Pocket(cm)                              4.2 ---------------------------------------------------------------------- Biometry (  Fetus A)  BPD:      65.2  mm     G. Age:  26w 3d         18  %    CI:        78.66   %   70 - 86                                                          FL/HC:      21.9   %   18.6 - 20.4  HC:      232.5  mm     G. Age:  25w 2d        < 3  %    HC/AC:      1.01       1.05 - 1.21  AC:      229.9  mm     G. Age:  27w 3d         10  %    FL/BPD:     78.2   %   71 - 87  FL:         51  mm     G. Age:  27w 2d         45  %    FL/AC:      22.2   %   20 - 24  HUM:      45.1  mm     G. Age:  26w 5d         40  %  CER:      31.5  mm     G. Age:  27w 4d         58  %  Est. FW:    1022  gm      2 lb 4 oz     55  %     FW Discordancy         4  % ---------------------------------------------------------------------- Gestational Age (Fetus A)  LMP:           27w 0d       Date:   11/21/16                 EDD:   08/28/17  U/S Today:     26w 4d                                        EDD:   08/31/17  Best:          27w 0d    Det. By:   LMP  (11/21/16)          EDD:   08/28/17 ---------------------------------------------------------------------- Anatomy (Fetus A)  Cranium:               Appears normal         Aortic Arch:            Previously seen  Cavum:                 Appears normal         Ductal Arch:            Previously seen  Ventricles:  Appears normal         Diaphragm:              Previously seen  Choroid Plexus:        Previously seen        Stomach:                 Appears normal, left                                                                        sided  Cerebellum:            Appears normal         Abdomen:                Appears normal  Posterior Fossa:       Previously seen        Abdominal Wall:         Previously seen  Nuchal Fold:           Previously seen        Cord Vessels:           Appears normal (3                                                                        vessel cord)  Face:                  Orbits and profile     Kidneys:                Appear normal                         previously seen  Lips:                  Previously seen        Bladder:                Appears normal  Thoracic:              Appears normal         Spine:                  Previously seen  Heart:                 Appears normal         Upper Extremities:      Previously seen                         (4CH, axis, and                         situs)  RVOT:                  Previously seen        Lower Extremities:  Previously seen  LVOT:                  Previously seen  Other:  Female gender. Heels and Nasal bone previously visualized.          Technically difficult due to fetal position. ---------------------------------------------------------------------- Fetal Evaluation (Fetus B)  Num Of Fetuses:     2  Fetal Heart         150  Rate(bpm):  Cardiac Activity:   Observed  Fetal Lie:          Upper Right Fetus  Presentation:       Cephalic  Placenta:           Posterior, above cervical os  P. Cord Insertion:  Visualized, central  Membrane Desc:      Dividing Membrane seen  Amniotic Fluid  AFI FV:      Subjectively within normal limits                              Largest Pocket(cm)                              5.2  Comment:    Stomach and bladder visualized. ---------------------------------------------------------------------- Biometry (Fetus B)  BPD:      66.4  mm     G. Age:  26w 5d         31  %    CI:        77.42   %   70 - 86                                                           FL/HC:      21.7   %   18.6 - 20.4  HC:      238.9  mm     G. Age:  26w 0d          5  %    HC/AC:      1.03       1.05 - 1.21  AC:      231.1  mm     G. Age:  27w 4d         56  %    FL/BPD:     78.0   %   71 - 87  FL:       51.8  mm     G. Age:  27w 4d         55  %    FL/AC:      22.4   %   20 - 24  HUM:      46.1  mm     G. Age:  27w 1d         51  %  CER:      32.6  mm     G. Age:  28w 4d         74  %  Est. FW:    1060  gm      2 lb 5 oz     60  %     FW Discordancy      0 \ 4 % ----------------------------------------------------------------------  Gestational Age (Fetus B)  LMP:           27w 0d       Date:   11/21/16                 EDD:   08/28/17  U/S Today:     27w 0d                                        EDD:   08/28/17  Best:          27w 0d    Det. By:   LMP  (11/21/16)          EDD:   08/28/17 ---------------------------------------------------------------------- Anatomy (Fetus B)  Cranium:               Appears normal         Aortic Arch:            Previously seen  Cavum:                 Appears normal         Ductal Arch:            Previously seen  Ventricles:            Appears normal         Diaphragm:              Previously seen  Choroid Plexus:        Previously seen        Stomach:                Appears normal, left                                                                        sided  Cerebellum:            Appears normal         Abdomen:                Appears normal  Posterior Fossa:       Appears normal         Abdominal Wall:         Previously seen  Nuchal Fold:           Previously seen        Cord Vessels:           Appears normal (3                                                                        vessel cord)  Face:                  Orbits and profile     Kidneys:                Appear normal  previously seen  Lips:                  Previously seen        Bladder:                Appears normal  Thoracic:               Appears normal         Spine:                  Previously seen  Heart:                 Appears normal         Upper Extremities:      Previously seen                         (4CH, axis, and                         situs)  RVOT:                  Appears normal         Lower Extremities:      Previously seen  LVOT:                  Appears normal  Other:  Female gender. Heels, 5th digit, and Nasal bone previously          visualized. Technically difficult due to fetal position. ---------------------------------------------------------------------- Cervix Uterus Adnexa  Cervix  Length:            3.2  cm.  Normal appearance by transabdominal scan.  Uterus  Normal shape and size.  Left Ovary  Size(cm)       3.5 x    1.1    x  1.9       Vol(ml): 3.8  Within normal limits.  Right Ovary  Size(cm)       3.2 x    2.5    x  3         Vol(ml): 12.6  Within normal limits.  Cul De Sac:   No free fluid seen.  Adnexa:       No abnormality visualized. ---------------------------------------------------------------------- Impression  Monochorionic/diamniotic twin pregnancy at 27+0 weeks  Normal interval anatomy x 2; anatomic survey complete x 2  Normal amniotic fluid volume x 2  Appropriate interval growths with EFWs at the 55th and 60th  %tiles ---------------------------------------------------------------------- Recommendations  TTTS check in 2 weeks  Growth [place] in 4 weeks ----------------------------------------------------------------------               Vance Peper, BS, RDMS, RVT Electronically Signed Corrected Final Report  05/30/2017 11:59 am ----------------------------------------------------------------------  [place] Mfm Ob Follow Up Addl Gest  Result Date: 05/30/2017 ----------------------------------------------------------------------  OBSTETRICS REPORT                        (Corrected Final 05/30/2017 11:59                                                                          am)  ----------------------------------------------------------------------  Patient Info  ID #:       [id number]                         D.O.B.:   Mar 10, 1991 (26 yrs)  Name:       Sharon Chandler           Visit Date:  05/29/2017 10:10 am ---------------------------------------------------------------------- Performed By  Performed By:     Sharon Chandler         Ref. Address:     [address]                                                             [place], [place]  Attending:        Renella Cunas Chandler       Location[facility]         [facility]  Referred By:      Sharon Chandler ---------------------------------------------------------------------- Orders   #  Description                                 Code   1  [place] MFM OB FOLLOW UP                         [phone]   2  [place] MFM OB FOLLOW UP ADDL GEST               89211.94  ----------------------------------------------------------------------   #  Ordered By               Order #        Accession #    Episode #   1  Sharon Chandler            [id number]      [id number]     [id number]   2  [facility]            [id number]      [id number]     [id number]  ---------------------------------------------------------------------- Indications   [redacted] weeks gestation of pregnancy                Z3A.27   Twin pregnancy, mono/di, second trimester;     O30.032   ECHOs pending  08/24   Short interval between pregancies, 2nd         O09.892   trimester  ---------------------------------------------------------------------- OB History  Blood Type:            Height:  5'5"   Weight (lb):  140      BMI:   23.29  Gravidity:    3         Term:   2  Living:       2 ---------------------------------------------------------------------- Fetal Evaluation (Fetus A)  Num Of Fetuses:     2  Cardiac Activity:   Observed  Fetal  Lie:          Lower Left Fetus  Presentation:       Breech  Placenta:           Posterior, above cervical os  P. Cord Insertion:  Visualized  Membrane Desc:      Dividing Membrane seen  Amniotic Fluid  AFI FV:      Subjectively within normal limits                              Largest Pocket(cm)                              4.2 ---------------------------------------------------------------------- Biometry (Fetus A)  BPD:      65.2  mm     G. Age:  26w 3d         18  %    CI:        78.66   %   70 - 86                                                          FL/HC:      21.9   %   18.6 - 20.4  HC:      232.5  mm     G. Age:  25w 2d        < 3  %    HC/AC:      1.01       1.05 - 1.21  AC:      229.9  mm     G. Age:  27w 3d         61  %    FL/BPD:     78.2   %   71 - 87  FL:         51  mm     G. Age:  27w 2d         45  %    FL/AC:      22.2   %   20 - 24  HUM:      45.1  mm     G. Age:  26w 5d         40  %  CER:      31.5  mm     G. Age:  27w 4d         58  %  Est. FW:    1022  gm      2 lb 4 oz     55  %     FW  Discordancy         4  % ---------------------------------------------------------------------- Gestational Age (Fetus A)  LMP:           27w 0d       Date:   11/21/16                 EDD:   08/28/17  U/S Today:     26w 4d                                        EDD:   08/31/17  Best:          27w 0d    Det. By:   LMP  (11/21/16)          EDD:   08/28/17 ---------------------------------------------------------------------- Anatomy (Fetus A)  Cranium:               Appears normal         Aortic Arch:            Previously seen  Cavum:                 Appears normal         Ductal Arch:            Previously seen  Ventricles:            Appears normal         Diaphragm:              Previously seen  Choroid Plexus:        Previously seen        Stomach:                Appears normal, left                                                                        sided  Cerebellum:            Appears normal          Abdomen:                Appears normal  Posterior Fossa:       Previously seen        Abdominal Wall:         Previously seen  Nuchal Fold:           Previously seen        Cord Vessels:           Appears normal (3                                                                        vessel cord)  Face:                  Orbits and profile     Kidneys:  Appear normal                         previously seen  Lips:                  Previously seen        Bladder:                Appears normal  Thoracic:              Appears normal         Spine:                  Previously seen  Heart:                 Appears normal         Upper Extremities:      Previously seen                         (4CH, axis, and                         situs)  RVOT:                  Previously seen        Lower Extremities:      Previously seen  LVOT:                  Previously seen  Other:  Female gender. Heels and Nasal bone previously visualized.          Technically difficult due to fetal position. ---------------------------------------------------------------------- Fetal Evaluation (Fetus B)  Num Of Fetuses:     2  Fetal Heart         150  Rate(bpm):  Cardiac Activity:   Observed  Fetal Lie:          Upper Right Fetus  Presentation:       Cephalic  Placenta:           Posterior, above cervical os  P. Cord Insertion:  Visualized, central  Membrane Desc:      Dividing Membrane seen  Amniotic Fluid  AFI FV:      Subjectively within normal limits                              Largest Pocket(cm)                              5.2  Comment:    Stomach and bladder visualized. ---------------------------------------------------------------------- Biometry (Fetus B)  BPD:      66.4  mm     G. Age:  26w 5d         31  %    CI:        77.42   %   70 - 86                                                          FL/HC:      21.7   %   18.6 - 20.4  HC:  238.9  mm     G. Age:  26w 0d          5  %    HC/AC:      1.03       1.05 - 1.21   AC:      231.1  mm     G. Age:  27w 4d         56  %    FL/BPD:     78.0   %   71 - 87  FL:       51.8  mm     G. Age:  27w 4d         55  %    FL/AC:      22.4   %   20 - 24  HUM:      46.1  mm     G. Age:  27w 1d         51  %  CER:      32.6  mm     G. Age:  28w 4d         74  %  Est. FW:    1060  gm      2 lb 5 oz     60  %     FW Discordancy      0 \ 4 % ---------------------------------------------------------------------- Gestational Age (Fetus B)  LMP:           27w 0d       Date:   11/21/16                 EDD:   08/28/17  U/S Today:     27w 0d                                        EDD:   08/28/17  Best:          27w 0d    Det. By:   LMP  (11/21/16)          EDD:   08/28/17 ---------------------------------------------------------------------- Anatomy (Fetus B)  Cranium:               Appears normal         Aortic Arch:            Previously seen  Cavum:                 Appears normal         Ductal Arch:            Previously seen  Ventricles:            Appears normal         Diaphragm:              Previously seen  Choroid Plexus:        Previously seen        Stomach:                Appears normal, left  sided  Cerebellum:            Appears normal         Abdomen:                Appears normal  Posterior Fossa:       Appears normal         Abdominal Wall:         Previously seen  Nuchal Fold:           Previously seen        Cord Vessels:           Appears normal (3                                                                        vessel cord)  Face:                  Orbits and profile     Kidneys:                Appear normal                         previously seen  Lips:                  Previously seen        Bladder:                Appears normal  Thoracic:              Appears normal         Spine:                  Previously seen  Heart:                 Appears normal         Upper Extremities:      Previously seen                          (4CH, axis, and                         situs)  RVOT:                  Appears normal         Lower Extremities:      Previously seen  LVOT:                  Appears normal  Other:  Female gender. Heels, 5th digit, and Nasal bone previously          visualized. Technically difficult due to fetal position. ---------------------------------------------------------------------- Cervix Uterus Adnexa  Cervix  Length:            3.2  cm.  Normal appearance by transabdominal scan.  Uterus  Normal shape and size.  Left Ovary  Size(cm)       3.5 x    1.1    x  1.9       Vol(ml): 3.8  Within normal limits.  Right Ovary  Size(cm)  3.2 x    2.5    x  3         Vol(ml): 12.6  Within normal limits.  Cul De Sac:   No free fluid seen.  Adnexa:       No abnormality visualized. ---------------------------------------------------------------------- Impression  Monochorionic/diamniotic twin pregnancy at 27+0 weeks  Normal interval anatomy x 2; anatomic survey complete x 2  Normal amniotic fluid volume x 2  Appropriate interval growths with EFWs at the 55th and 60th  %tiles ---------------------------------------------------------------------- Recommendations  TTTS check in 2 weeks  Growth [place] in 4 weeks ----------------------------------------------------------------------               Vance Peper, BS, RDMS, RVT Electronically Signed Corrected Final Report  05/30/2017 11:59 am ----------------------------------------------------------------------  [place] Mfm Ob Limited  Result Date: 06/12/2017 ----------------------------------------------------------------------  OBSTETRICS REPORT                      (Signed Final 06/12/2017 10:30 am) ---------------------------------------------------------------------- Patient Info  ID #:       [id number]                          D.O.B.:  1991/07/07 (26 yrs)  Name:       Sharon Chandler            Visit Date: 06/12/2017 09:54 am  ---------------------------------------------------------------------- Performed By  Performed By:     Rodrigo Ran BS      Ref. Address:     [address]                    [place]                                                             [place], [place]  Attending:        Griffin Dakin [facility]         Location:         [facility]  Referred By:      Sharon Chandler ---------------------------------------------------------------------- Orders   #  Description                                 Code   1  [place] MFM OB LIMITED  58309.40  ----------------------------------------------------------------------   #  Ordered By               Order #        Accession #    Episode #   1  Sharon Chandler            [id number]      [id number]     [id number]  ---------------------------------------------------------------------- Indications   [redacted] weeks gestation of pregnancy                Z3A.29   Short interval between pregancies, 2nd         O09.892   trimester   Twin pregnancy, mono/di, third trimester       O30.033  ---------------------------------------------------------------------- OB History  Blood Type:            Height:  5'5"   Weight (lb):  140       BMI:  23.29  Gravidity:    3         Term:   2  Living:       2 ---------------------------------------------------------------------- Fetal Evaluation (Fetus A)  Num Of Fetuses:     2  Fetal Heart         157  Rate(bpm):  Cardiac Activity:   Observed  Fetal Lie:          Lower Left Fetus  Presentation:       Breech  Placenta:           Posterior, above cervical os  P. Cord Insertion:  Previously Visualized  Amniotic Fluid  AFI FV:      Subjectively within normal limits                              Largest Pocket(cm)                              2.8  ---------------------------------------------------------------------- Gestational Age (Fetus A)  LMP:           29w 0d        Date:  11/21/16                 EDD:   08/28/17  Best:          29w 0d     Det. By:  LMP  (11/21/16)          EDD:   08/28/17 ---------------------------------------------------------------------- Fetal Evaluation (Fetus B)  Num Of Fetuses:     2  Fetal Heart         148  Rate(bpm):  Cardiac Activity:   Observed  Fetal Lie:          Upper Right Fetus  Presentation:       Cephalic  Placenta:           Posterior, above cervical os  P. Cord Insertion:  Visualized  Amniotic Fluid  AFI FV:      Subjectively within normal limits                              Largest Pocket(cm)                              5.15 ---------------------------------------------------------------------- Gestational Age (Fetus B)  LMP:  29w 0d        Date:  11/21/16                 EDD:   08/28/17  Best:          29w 0d     Det. By:  LMP  (11/21/16)          EDD:   08/28/17 ---------------------------------------------------------------------- Cervix Uterus Adnexa  Cervix  Not visualized (advanced GA >29wks) ---------------------------------------------------------------------- Impression  Monochorionic diamniotic twin pregnancy at 29+0 weeks,  here for TTTS check  Normal amniotic fluid volume for both twins  Both twins have visible bladders and stomachs  Normal movement and cardiac activity in both twins  No signs of twin-twin transfusion. ---------------------------------------------------------------------- Recommendations  Continue current [place] evaluation regimen ----------------------------------------------------------------------                 Griffin Dakin, [place] Electronically Signed Final Report   06/12/2017 10:30 am ----------------------------------------------------------------------  Bedside scan:  Good FM x 2, nml AFV x 2, FHR reassuring x 2 Assessment and Plan:  Pregnancy: G3P2002 at [redacted]w[redacted]d 1.  Elevated blood pressure complicating pregnancy, antepartum, third trimester Denies any headaches, visual changes, RUQ/epigastric pain.  Preeclampsia precautions reviewed. Will check labs today. Will give betamethasone today, repeat tomorrow along with BP check. If BP worsens, or any concerning labs for severe preeclampsia, [facility].   - CBC - Comp Met (CMET) - betamethasone acetate-betamethasone sodium phosphate (CELESTONE) injection 12 mg; Inject 2 mLs (12 mg total) into the muscle every 24 hours x 2 doses. - Protein / creatinine ratio, urine  2. Chlamydia infection affecting pregnancy in second trimester, antepartum Test of cure done today. - Urine cytology ancillary only  3. Monochorionic diamniotic twin pregnancy, antepartum Continue monitoring as per MFM, next scheduled scan is on 06/26/17..  4. Supervision of high risk pregnancy in third trimester Preterm labor symptoms and general obstetric precautions including but not limited to vaginal bleeding, contractions, leaking of fluid and fetal movement were reviewed in detail with the patient. Please refer to After Visit Summary for other counseling recommendations.  Return in about 1 day (around 06/22/2017) for BP check, BMZ#2. Then one week for OB visit (or earlier if needed).   [facility] Schneiders [place]

## 2017-06-21 NOTE — Patient Instructions (Signed)
Preeclampsia and Eclampsia °Preeclampsia is a serious condition that develops only during pregnancy. It is also called toxemia of pregnancy. This condition causes high blood pressure along with other symptoms, such as swelling and headaches. These symptoms may develop as the condition gets worse. Preeclampsia may occur at 20 weeks of pregnancy or later. °Diagnosing and treating preeclampsia early is very important. If not treated early, it can cause serious problems for you and your baby. One problem it can lead to is eclampsia, which is a condition that causes muscle jerking or shaking (convulsions or seizures) in the mother. Delivering your baby is the best treatment for preeclampsia or eclampsia. Preeclampsia and eclampsia symptoms usually go away after your baby is born. °What are the causes? °The cause of preeclampsia is not known. °What increases the risk? °The following risk factors make you more likely to develop preeclampsia: °· Being pregnant for the first time. °· Having had preeclampsia during a past pregnancy. °· Having a family history of preeclampsia. °· Having high blood pressure. °· Being pregnant with twins or triplets. °· Being 35 or older. °· Being African-American. °· Having kidney disease or diabetes. °· Having medical conditions such as lupus or blood diseases. °· Being very overweight (obese). ° °What are the signs or symptoms? °The earliest signs of preeclampsia are: °· High blood pressure. °· Increased protein in your urine. Your health care provider will check for this at every visit before you give birth (prenatal visit). ° °Other symptoms that may develop as the condition gets worse include: °· Severe headaches. °· Sudden weight gain. °· Swelling of the hands, face, legs, and feet. °· Nausea and vomiting. °· Vision problems, such as blurred or double vision. °· Numbness in the face, arms, legs, and feet. °· Urinating less than usual. °· Dizziness. °· Slurred speech. °· Abdominal pain,  especially upper abdominal pain. °· Convulsions or seizures. ° °Symptoms generally go away after giving birth. °How is this diagnosed? °There are no screening tests for preeclampsia. Your health care provider will ask you about symptoms and check for signs of preeclampsia during your prenatal visits. You may also have tests that include: °· Urine tests. °· Blood tests. °· Checking your blood pressure. °· Monitoring your baby’s heart rate. °· Ultrasound. ° °How is this treated? °You and your health care provider will determine the treatment approach that is best for you. Treatment may include: °· Having more frequent prenatal exams to check for signs of preeclampsia, if you have an increased risk for preeclampsia. °· Bed rest. °· Reducing how much salt (sodium) you eat. °· Medicine to lower your blood pressure. °· Staying in the hospital, if your condition is severe. There, treatment will focus on controlling your blood pressure and the amount of fluids in your body (fluid retention). °· You may need to take medicine (magnesium sulfate) to prevent seizures. This medicine may be given as an injection or through an IV tube. °· Delivering your baby early, if your condition gets worse. You may have your labor started with medicine (induced), or you may have a cesarean delivery. ° °Follow these instructions at home: °Eating and drinking ° °· Drink enough fluid to keep your urine clear or pale yellow. °· Eat a healthy diet that is low in sodium. Do not add salt to your food. Check nutrition labels to see how much sodium a food or beverage contains. °· Avoid caffeine. °Lifestyle °· Do not use any products that contain nicotine or tobacco, such as cigarettes   and e-cigarettes. If you need help quitting, ask your health care provider. °· Do not use alcohol or drugs. °· Avoid stress as much as possible. Rest and get plenty of sleep. °General instructions °· Take over-the-counter and prescription medicines only as told by your  health care provider. °· When lying down, lie on your side. This keeps pressure off of your baby. °· When sitting or lying down, raise (elevate) your feet. Try putting some pillows underneath your lower legs. °· Exercise regularly. Ask your health care provider what kinds of exercise are best for you. °· Keep all follow-up and prenatal visits as told by your health care provider. This is important. °How is this prevented? °To prevent preeclampsia or eclampsia from developing during another pregnancy: °· Get proper medical care during pregnancy. Your health care provider may be able to prevent preeclampsia or diagnose and treat it early. °· Your health care provider may have you take a low-dose aspirin or a calcium supplement during your next pregnancy. °· You may have tests of your blood pressure and kidney function after giving birth. °· Maintain a healthy weight. Ask your health care provider for help managing weight gain during pregnancy. °· Work with your health care provider to manage any long-term (chronic) health conditions you have, such as diabetes or kidney problems. ° °Contact a health care provider if: °· You gain more weight than expected. °· You have headaches. °· You have nausea or vomiting. °· You have abdominal pain. °· You feel dizzy or light-headed. °Get help right away if: °· You develop sudden or severe swelling anywhere in your body. This usually happens in the legs. °· You gain 5 lbs (2.3 kg) or more during one week. °· You have severe: °? Abdominal pain. °? Headaches. °? Dizziness. °? Vision problems. °? Confusion. °? Nausea or vomiting. °· You have a seizure. °· You have trouble moving any part of your body. °· You develop numbness in any part of your body. °· You have trouble speaking. °· You have any abnormal bleeding. °· You pass out. °This information is not intended to replace advice given to you by your health care provider. Make sure you discuss any questions you have with your health  care provider. °Document Released: 09/22/2000 Document Revised: 05/23/2016 Document Reviewed: 05/01/2016 °[employer] Interactive Patient Education © 2018 [employer] ° °

## 2017-06-22 ENCOUNTER — Ambulatory Visit (INDEPENDENT_AMBULATORY_CARE_PROVIDER_SITE_OTHER): Payer: Medicaid Other | Admitting: *Deleted

## 2017-06-22 ENCOUNTER — Encounter: Payer: Self-pay | Admitting: *Deleted

## 2017-06-22 ENCOUNTER — Encounter: Payer: Self-pay | Admitting: Radiology

## 2017-06-22 VITALS — BP 138/83 | HR 119 | Wt 168.0 lb

## 2017-06-22 DIAGNOSIS — O163 Unspecified maternal hypertension, third trimester: Secondary | ICD-10-CM | POA: Diagnosis not present

## 2017-06-22 DIAGNOSIS — O0993 Supervision of high risk pregnancy, unspecified, third trimester: Secondary | ICD-10-CM

## 2017-06-22 DIAGNOSIS — R300 Dysuria: Secondary | ICD-10-CM

## 2017-06-22 LAB — PROTEIN / CREATININE RATIO, URINE
Creatinine, Urine: 61.6 mg/dL
PROTEIN UR: 26.4 mg/dL
PROTEIN/CREAT RATIO: 429 mg/g{creat} — AB (ref 0–200)

## 2017-06-22 LAB — CBC
HEMATOCRIT: 31.6 % — AB (ref 34.0–46.6)
Hemoglobin: 10.4 g/dL — ABNORMAL LOW (ref 11.1–15.9)
MCH: 29.5 pg (ref 26.6–33.0)
MCHC: 32.9 g/dL (ref 31.5–35.7)
MCV: 90 fL (ref 79–97)
PLATELETS: 250 10*3/uL (ref 150–379)
RBC: 3.52 x10E6/uL — AB (ref 3.77–5.28)
RDW: 13.3 % (ref 12.3–15.4)
WBC: 8.4 10*3/uL (ref 3.4–10.8)

## 2017-06-22 LAB — COMPREHENSIVE METABOLIC PANEL
ALT: 7 IU/L (ref 0–32)
AST: 14 IU/L (ref 0–40)
Albumin/Globulin Ratio: 1.2 (ref 1.2–2.2)
Albumin: 3.2 g/dL — ABNORMAL LOW (ref 3.5–5.5)
Alkaline Phosphatase: 133 IU/L — ABNORMAL HIGH (ref 39–117)
BUN/Creatinine Ratio: 7 — ABNORMAL LOW (ref 9–23)
BUN: 4 mg/dL — ABNORMAL LOW (ref 6–20)
Bilirubin Total: <0.2 mg/dL (ref 0.0–1.2)
CALCIUM: 9.2 mg/dL (ref 8.7–10.2)
CO2: 22 mmol/L (ref 20–29)
CREATININE: 0.59 mg/dL (ref 0.57–1.00)
Chloride: 104 mmol/L (ref 96–106)
GFR calc Af Amer: 146 mL/min/{1.73_m2} (ref >59)
GFR calc non Af Amer: 127 mL/min/{1.73_m2} (ref >59)
Globulin, Total: 2.6 g/dL (ref 1.5–4.5)
Glucose: 104 mg/dL — ABNORMAL HIGH (ref 65–99)
Potassium: 4.6 mmol/L (ref 3.5–5.2)
Sodium: 140 mmol/L (ref 134–144)
Total Protein: 5.8 g/dL — ABNORMAL LOW (ref 6.0–8.5)

## 2017-06-22 LAB — URINE CYTOLOGY ANCILLARY ONLY
Chlamydia: NEGATIVE
Neisseria Gonorrhea: NEGATIVE

## 2017-06-22 NOTE — Progress Notes (Signed)
Subjective:  Sharon Chandler is a 26 y.o. female for BP check and Betamethasone injection #2.   New concerns: DYSURIA   Urine dipstick shows Large positive for ketones.    Objective:  BP 138/83   Pulse (!) 119   Wt 168 lb (76.2 kg)   LMP 11/21/2016   BMI 29.76 kg/m   Denies any headaches, visual changes, RUQ/epigastric pain.  Preeclampsia precautions reviewed. Appearance alert, well appearing, and in no distress and oriented to person, place, and time. General exam BP noted to be well controlled today in office.    Assessment:   BP is stable.   Dysuria  Plan:  Treatment per orders.  Call or return to clinic prn if these symptoms worsen or fail to improve as anticipated. Injection: Ordered Admin Amount: 2 mL = 12 mg of 6 mg/mL     Last Admin: Today09/14/18at1139     Frequency: Every 24 hr x 2  Route: Intramuscular  Order Dose: 12 mg  Order ID: [id number]     Order Start Time: Yesterday09/13/18at1215  Order End Time: Today09/14/18at1139

## 2017-06-22 NOTE — Progress Notes (Signed)
Patient seen and assessed by nursing staff.  Agree with documentation and plan.  

## 2017-06-25 LAB — CULTURE, URINE COMPREHENSIVE

## 2017-06-26 ENCOUNTER — Ambulatory Visit (HOSPITAL_COMMUNITY)
Admission: RE | Admit: 2017-06-26 | Discharge: 2017-06-26 | Disposition: A | Discharge status: Home / Self Care | Payer: Medicaid Other | Source: Ambulatory Visit | Attending: Family Medicine | Admitting: Family Medicine

## 2017-06-26 ENCOUNTER — Other Ambulatory Visit (HOSPITAL_COMMUNITY): Payer: Self-pay | Admitting: *Deleted

## 2017-06-26 ENCOUNTER — Other Ambulatory Visit (HOSPITAL_COMMUNITY): Payer: Self-pay | Admitting: Maternal and Fetal Medicine

## 2017-06-26 ENCOUNTER — Encounter (HOSPITAL_COMMUNITY): Payer: Self-pay

## 2017-06-26 DIAGNOSIS — O321XX1 Maternal care for breech presentation, fetus 1: Secondary | ICD-10-CM | POA: Insufficient documentation

## 2017-06-26 DIAGNOSIS — O30039 Twin pregnancy, monochorionic/diamniotic, unspecified trimester: Secondary | ICD-10-CM

## 2017-06-26 DIAGNOSIS — O365931 Maternal care for other known or suspected poor fetal growth, third trimester, fetus 1: Secondary | ICD-10-CM

## 2017-06-26 DIAGNOSIS — O09893 Supervision of other high risk pregnancies, third trimester: Secondary | ICD-10-CM

## 2017-06-26 DIAGNOSIS — O409XX0 Polyhydramnios, unspecified trimester, not applicable or unspecified: Secondary | ICD-10-CM | POA: Insufficient documentation

## 2017-06-26 DIAGNOSIS — Z3A31 31 weeks gestation of pregnancy: Secondary | ICD-10-CM | POA: Diagnosis not present

## 2017-06-26 DIAGNOSIS — O30033 Twin pregnancy, monochorionic/diamniotic, third trimester: Secondary | ICD-10-CM | POA: Insufficient documentation

## 2017-06-26 NOTE — Addendum Note (Signed)
Encounter addended by: Aundra Millet E on: 06/26/2017 12:19 PM<BR>    Actions taken: Imaging Exam ended

## 2017-06-26 NOTE — Addendum Note (Signed)
Encounter addended by: Levonne Hubert, RDMS, RVT on: 06/26/2017  5:54 PM<BR>    Actions taken: Imaging Exam ended

## 2017-06-29 ENCOUNTER — Other Ambulatory Visit (HOSPITAL_COMMUNITY): Payer: Self-pay | Admitting: Obstetrics and Gynecology

## 2017-06-29 ENCOUNTER — Other Ambulatory Visit: Payer: Self-pay | Admitting: Obstetrics & Gynecology

## 2017-06-29 ENCOUNTER — Ambulatory Visit (INDEPENDENT_AMBULATORY_CARE_PROVIDER_SITE_OTHER): Payer: Medicaid Other | Admitting: Obstetrics & Gynecology

## 2017-06-29 VITALS — BP 137/67 | HR 73 | Wt 167.0 lb

## 2017-06-29 DIAGNOSIS — N1330 Unspecified hydronephrosis: Secondary | ICD-10-CM

## 2017-06-29 DIAGNOSIS — O365930 Maternal care for other known or suspected poor fetal growth, third trimester, not applicable or unspecified: Secondary | ICD-10-CM | POA: Diagnosis not present

## 2017-06-29 DIAGNOSIS — O0993 Supervision of high risk pregnancy, unspecified, third trimester: Secondary | ICD-10-CM

## 2017-06-29 DIAGNOSIS — O30039 Twin pregnancy, monochorionic/diamniotic, unspecified trimester: Secondary | ICD-10-CM

## 2017-06-29 DIAGNOSIS — O30033 Twin pregnancy, monochorionic/diamniotic, third trimester: Secondary | ICD-10-CM | POA: Diagnosis not present

## 2017-06-29 DIAGNOSIS — O402XX2 Polyhydramnios, second trimester, fetus 2: Secondary | ICD-10-CM

## 2017-06-29 DIAGNOSIS — O365931 Maternal care for other known or suspected poor fetal growth, third trimester, fetus 1: Secondary | ICD-10-CM | POA: Insufficient documentation

## 2017-06-29 NOTE — Progress Notes (Signed)
PRENATAL VISIT NOTE  Subjective:  Sharon Chandler is a 26 y.o. G3P2002 at [redacted]w[redacted]d being seen today for ongoing prenatal care.  She is currently monitored for the following issues for this high-risk pregnancy and has Supervision of high-risk pregnancy; History of postpartum depression, currently pregnant; Short interval between pregnancies affecting pregnancy, antepartum; Monochorionic diamniotic twin pregnancy, antepartum; Hydronephrosis; Chlamydia infection affecting pregnancy in second trimester, antepartum; Bilateral pulmonary contusion; Fracture of malleolus of right ankle; LOC (loss of consciousness) (HCC); Elevated blood pressure complicating pregnancy, antepartum, third trimester; IUGR (intrauterine growth restriction) affecting care of mother, third trimester, fetus 1; and Polyhydramnios in second trimester, antepartum complication, fetus 2 on her problem list.  Patient reports no complaints.   .  .  Movement: Present. Denies leaking of fluid.   The following portions of the patient's history were reviewed and updated as appropriate: allergies, current medications, past family history, past medical history, past social history, past surgical history and problem list. Problem list updated.  Objective:   Vitals:   06/29/17 1033  BP: 137/67  Pulse: 73  Weight: 167 lb (75.8 kg)    Fetal Status:     Movement: Present     General:  Alert, oriented and cooperative. Patient is in no acute distress.  Skin: Skin is warm and dry. No rash noted.   Cardiovascular: Normal heart rate noted  Respiratory: Normal respiratory effort, no problems with respiration noted  Abdomen: Soft, gravid, appropriate for gestational age.  Pain/Pressure: Absent     Pelvic: Cervical exam deferred        Extremities: Normal range of motion.     Mental Status:  Normal mood and affect. Normal behavior. Normal judgment and thought content.   [place] Mfm Fetal Bpp Wo Non Stress  Result Date:  06/26/2017 ----------------------------------------------------------------------  OBSTETRICS REPORT                        (Corrected Final 06/26/2017 05:57                                                                          pm) ---------------------------------------------------------------------- Patient Info  ID #:       [id number]                          D.O.B.:  [birth year] (26 yrs)  Name:       Sharon Chandler            Visit Date: 06/26/2017 10:19 am ---------------------------------------------------------------------- Performed By  Performed By:     Eden Lathe BS      Ref. Address:     [address]                    RDMS [address]T                                                             Road  [place], [place]                                                             [address]  Attending:        Charlsie Merles MD         Location:         [facility]  Referred By:      Tereso Newcomer MD ---------------------------------------------------------------------- Orders   #  Description                                 Code   1  [place] MFM OB FOLLOW UP                         [phone]   2  [place] MFM OB FOLLOW UP ADDL GEST               76816.02   3  [place] MFM FETAL BPP WO NON STRESS              76819.01   4  [place] MFM FETAL BPP WO NST ADDL                95621.3      GESTATION   5  [place] MFM UA CORD DOPPLER                      76820.02   6  [place] MFM UA ADDL GEST                         76820.01  ----------------------------------------------------------------------   #  Ordered By               Order #        Accession #    Episode #   1  Particia Nearing            [id number]      [id number]     [id number]   2  MARTHA DECKER            [id number]      [id number]     [id number]   3  MARTHA DECKER            [id number]      [id number]     [id number]   4  MARTHA DECKER            [id number]      [id number]     [id number]   5  MARTHA DECKER             [id number]      [id number]     [id number]   6  MARTHA DECKER            [id number]      [id number]     [id number]  ---------------------------------------------------------------------- Indications   [redacted] weeks gestation of pregnancy                Z3A.31   Short interval between pregancies, 3rd  Z61.096   trimester (BMZ 9/13, 9/14)   Twin pregnancy, mono/di, third trimester       O30.033   Maternal care for known or suspected poor      O36.5931   fetal growth, third trimester, fetus 1  ---------------------------------------------------------------------- OB History  Blood Type:            Height:  5'5"   Weight (lb):  140       BMI:  23.29  Gravidity:    3         Term:   2  Living:       2 ---------------------------------------------------------------------- Fetal Evaluation (Fetus A)  Num Of Fetuses:     2  Fetal Heart         151  Rate(bpm):  Cardiac Activity:   Observed  Fetal Lie:          Lower Left Fetus  Presentation:       Breech  Placenta:           Posterior, above cervical os  P. Cord Insertion:  Previously Visualized  Amniotic Fluid  AFI FV:      Subjectively within normal limits                              Largest Pocket(cm)                              3.61 ---------------------------------------------------------------------- Biophysical Evaluation (Fetus A)  Amniotic F.V:   Pocket => 2 cm two         F. Tone:        Observed                  planes  F. Movement:    Observed                   Score:          8/8  F. Breathing:   Observed ---------------------------------------------------------------------- Biometry (Fetus A)  BPD:      74.3  mm     G. Age:  29w 6d         10  %    CI:        73.34   %    70 - 86                                                          FL/HC:      19.9   %    19.3 - 21.3  HC:      275.7  mm     G. Age:  30w 1d          5  %    HC/AC:      1.13        0.96 - 1.17  AC:      244.5  mm     G. Age:  28w 5d        < 3  %    FL/BPD:     74.0   %    71 - 87  FL:          55  mm  G. Age:  29w 0d          3  %    FL/AC:      22.5   %    20 - 24  HUM:      50.4  mm     G. Age:  29w 4d         21  %  Est. FW:    1323  gm    2 lb 15 oz      19  %     FW Discordancy        21  % ---------------------------------------------------------------------- Gestational Age (Fetus A)  LMP:           31w 0d        Date:  11/21/16                 EDD:   08/28/17  U/S Today:     29w 3d                                        EDD:   09/08/17  Best:          31w 0d     Det. By:  LMP  (11/21/16)          EDD:   08/28/17 ---------------------------------------------------------------------- Anatomy (Fetus A)  Cranium:               Appears normal         Aortic Arch:            Appears normal  Cavum:                 Appears normal         Ductal Arch:            Previously seen  Ventricles:            Appears normal         Diaphragm:              Previously seen  Choroid Plexus:        Appears normal         Stomach:                Appears normal, left                                                                        sided  Cerebellum:            Appears normal         Abdomen:                Appears normal  Posterior Fossa:       Appears normal         Abdominal Wall:         Previously seen  Nuchal Fold:           Previously seen        Cord Vessels:           Appears normal (3  vessel cord)  Face:                  Orbits and profile     Kidneys:                Appear normal                         previously seen  Lips:                  Appears normal         Bladder:                Appears normal  Thoracic:              Appears normal         Spine:                  Previously seen  Heart:                 Appears normal         Upper Extremities:      Previously seen                         (4CH, axis, and                         situs)  RVOT:                  Previously seen        Lower Extremities:      Previously seen   LVOT:                  Previously seen  Other:  Female gender. Heels and Nasal bone previously visualized.          Technically difficult due to fetal position. ---------------------------------------------------------------------- Doppler - Fetal Vessels (Fetus A)  Umbilical Artery   S/D     %tile     RI              PI              PSV    ADFV    RDFV                                                   (cm/s)   3.9       93   0.74             1.23             71.33      No      No ---------------------------------------------------------------------- Fetal Evaluation (Fetus B)  Num Of Fetuses:     2  Fetal Heart         143  Rate(bpm):  Cardiac Activity:   Observed  Fetal Lie:          Upper Right Fetus  Presentation:       Cephalic  Placenta:           Posterior, above cervical os  P. Cord Insertion:  Previously Visualized  Amniotic Fluid  AFI FV:      Polyhydramnios  Largest Pocket(cm)                              10.7 ---------------------------------------------------------------------- Biophysical Evaluation (Fetus B)  Amniotic F.V:   Polyhydramnios             F. Tone:        Observed  F. Movement:    Observed                   Score:          8/8  F. Breathing:   Observed ---------------------------------------------------------------------- Biometry (Fetus B)  BPD:      75.7  mm     G. Age:  30w 3d         21  %    CI:        71.06   %    70 - 86                                                          FL/HC:      19.5   %    19.3 - 21.3  HC:      286.1  mm     G. Age:  31w 3d         25  %    HC/AC:      1.03        0.96 - 1.17  AC:       278   mm     G. Age:  31w 6d         72  %    FL/BPD:     73.8   %    71 - 87  FL:       55.9  mm     G. Age:  29w 3d          7  %    FL/AC:      20.1   %    20 - 24  HUM:      50.8  mm     G. Age:  29w 5d         25  %  Est. FW:    1670  gm    3 lb 11 oz      54  %     FW Discordancy     0 \ 21 %  ---------------------------------------------------------------------- Gestational Age (Fetus B)  LMP:           31w 0d        Date:  11/21/16                 EDD:   08/28/17  U/S Today:     30w 6d                                        EDD:   08/29/17  Best:          31w 0d     Det. By:  LMP  (11/21/16)          EDD:   08/28/17 ---------------------------------------------------------------------- Anatomy (Fetus B)  Cranium:  Appears normal         Aortic Arch:            Appears normal  Cavum:                 Appears normal         Ductal Arch:            Previously seen  Ventricles:            Appears normal         Diaphragm:              Appears normal  Choroid Plexus:        Appears normal         Stomach:                Appears normal, left                                                                        sided  Cerebellum:            Appears normal         Abdomen:                Appears normal  Posterior Fossa:       Appears normal         Abdominal Wall:         Appears nml (cord                                                                        insert, abd wall)  Nuchal Fold:           Previously seen        Cord Vessels:           Appears normal (3                                                                        vessel cord)  Face:                  Appears normal         Kidneys:                Appear normal                         (orbits and profile)  Lips:                  Appears normal         Bladder:                Appears normal  Thoracic:  Appears normal         Spine:                  Previously seen  Heart:                 Appears normal         Upper Extremities:      Previously seen                         (4CH, axis, and                         situs)  RVOT:                  Previously seen        Lower Extremities:      Previously seen  LVOT:                  Appears normal  Other:  Female gender. Heels, 5th digit, and Nasal bone previously           visualized. Technically difficult due to fetal position. ---------------------------------------------------------------------- Doppler - Fetal Vessels (Fetus B)  Umbilical Artery   S/D     %tile     RI              PI              PSV    ADFV    RDFV                                                   (cm/s)  2.92       57   0.66             1.02             82.05      No      No ---------------------------------------------------------------------- Cervix Uterus Adnexa  Cervix  Not visualized (advanced GA >29wks)  Uterus  No abnormality visualized.  Left Ovary  Not visualized.  Right Ovary  Not visualized.  Cul De Sac:   No free fluid seen.  Adnexa:       No abnormality visualized. ---------------------------------------------------------------------- Impression  Monochorionic diamniotic twin pregnancy at 31+0 weeks,  here for growth evaluation and TTTS check  Normal amniotic fluid volume for twin A, but mild  polyhydramnios on twin B with MVPs of 3.6cm and 10.7 cm  respectively  Interval review of anatomy is normal for both twins: both twins  have visible bladders and stomachs  Growth is in the 19th percentile for A and 51st percentile for  B/ A's abdominal circumfrence is <3rd percentile.  Discordance was 21%  BPP 8/8 in both twins  Umbilical artery dopplers were obtained from both twins, and  both were normal; neither had absent or reversed end  diastolic flow ---------------------------------------------------------------------- Recommendations  Discussed findings with patient and family. This does not  appear to be developing TTTS, but we will begin weekly  evaluations due to IUGR of A and polyhydramnios of B, with  BPP and dopplers next week ----------------------------------------------------------------------               Eden Lathe, BS RDMS RVT Electronically Signed Corrected Final Report  06/26/2017 05:57  pm ----------------------------------------------------------------------  [place] Mfm Ob Follow  Up  Result Date: 06/26/2017 ----------------------------------------------------------------------  OBSTETRICS REPORT                        (Corrected Final 06/26/2017 05:57                                                                          pm) ---------------------------------------------------------------------- Patient Info  ID #:       [id number]                          D.O.B.:  Nov 22, 1990 (26 yrs)  Name:       Sharon Chandler            Visit Date: 06/26/2017 10:19 am ---------------------------------------------------------------------- Performed By  Performed By:     Eden Lathe BS      Ref. Address:     [address]                    [address] [address]T                                                             [address]                                                             [place], [place]                                                             [address]  Attending:        Charlsie Merles MD         Location:         [facility]  Referred By:      Tereso Newcomer MD ---------------------------------------------------------------------- Orders   #  Description                                 Code   1  [place] MFM OB FOLLOW UP                         72536.64   2  [place] MFM OB FOLLOW UP ADDL GEST               40347.42   3  [place] MFM FETAL BPP WO NON STRESS  16109.60   4  [place] MFM FETAL BPP WO NST ADDL                76819.1      GESTATION   5  [place] MFM UA CORD DOPPLER                      76820.02   6  [place] MFM UA ADDL GEST                         76820.01  ----------------------------------------------------------------------   #  Ordered By               Order #        Accession #    Episode #   1  Particia Nearing            [id number]      [id number]     [id number]   2  MARTHA DECKER            [id number]      [id number]     [id number]   3  MARTHA DECKER            [id number]      [id number]     [id number]   4  MARTHA DECKER            [id number]      [id number]     [id number]   5  MARTHA  DECKER            [id number]      [id number]     [id number]   6  MARTHA DECKER            [id number]      [id number]     [id number]  ---------------------------------------------------------------------- Indications   [redacted] weeks gestation of pregnancy                Z3A.31   Short interval between pregancies, 3rd         O09.893   trimester (BMZ 9/13, 9/14)   Twin pregnancy, mono/di, third trimester       O30.033   Maternal care for known or suspected poor      O36.5931   fetal growth, third trimester, fetus 1  ---------------------------------------------------------------------- OB History  Blood Type:            Height:  5'5"   Weight (lb):  140       BMI:  23.29  Gravidity:    3         Term:   2  Living:       2 ---------------------------------------------------------------------- Fetal Evaluation (Fetus A)  Num Of Fetuses:     2  Fetal Heart         151  Rate(bpm):  Cardiac Activity:   Observed  Fetal Lie:          Lower Left Fetus  Presentation:       Breech  Placenta:           Posterior, above cervical os  P. Cord Insertion:  Previously Visualized  Amniotic Fluid  AFI FV:      Subjectively within normal limits                              Largest Pocket(cm)  3.61 ---------------------------------------------------------------------- Biophysical Evaluation (Fetus A)  Amniotic F.V:   Pocket => 2 cm two         F. Tone:        Observed                  planes  F. Movement:    Observed                   Score:          8/8  F. Breathing:   Observed ---------------------------------------------------------------------- Biometry (Fetus A)  BPD:      74.3  mm     G. Age:  29w 6d         10  %    CI:        73.34   %    70 - 86                                                          FL/HC:      19.9   %    19.3 - 21.3  HC:      275.7  mm     G. Age:  30w 1d          5  %    HC/AC:      1.13        0.96 - 1.17  AC:      244.5  mm     G. Age:  28w 5d        < 3  %    FL/BPD:     74.0   %    71  - 87  FL:         55  mm     G. Age:  29w 0d          3  %    FL/AC:      22.5   %    20 - 24  HUM:      50.4  mm     G. Age:  29w 4d         21  %  Est. FW:    1323  gm    2 lb 15 oz      19  %     FW Discordancy        21  % ---------------------------------------------------------------------- Gestational Age (Fetus A)  LMP:           31w 0d        Date:  11/21/16                 EDD:   08/28/17  U/S Today:     29w 3d                                        EDD:   09/08/17  Best:          31w 0d     Det. By:  LMP  (11/21/16)          EDD:   08/28/17 ---------------------------------------------------------------------- Anatomy (Fetus A)  Cranium:  Appears normal         Aortic Arch:            Appears normal  Cavum:                 Appears normal         Ductal Arch:            Previously seen  Ventricles:            Appears normal         Diaphragm:              Previously seen  Choroid Plexus:        Appears normal         Stomach:                Appears normal, left                                                                        sided  Cerebellum:            Appears normal         Abdomen:                Appears normal  Posterior Fossa:       Appears normal         Abdominal Wall:         Previously seen  Nuchal Fold:           Previously seen        Cord Vessels:           Appears normal (3                                                                        vessel cord)  Face:                  Orbits and profile     Kidneys:                Appear normal                         previously seen  Lips:                  Appears normal         Bladder:                Appears normal  Thoracic:              Appears normal         Spine:                  Previously seen  Heart:                 Appears normal         Upper Extremities:      Previously seen                         (  4CH, axis, and                         situs)  RVOT:                  Previously seen        Lower Extremities:       Previously seen  LVOT:                  Previously seen  Other:  Female gender. Heels and Nasal bone previously visualized.          Technically difficult due to fetal position. ---------------------------------------------------------------------- Doppler - Fetal Vessels (Fetus A)  Umbilical Artery   S/D     %tile     RI              PI              PSV    ADFV    RDFV                                                   (cm/s)   3.9       93   0.74             1.23             71.33      No      No ---------------------------------------------------------------------- Fetal Evaluation (Fetus B)  Num Of Fetuses:     2  Fetal Heart         143  Rate(bpm):  Cardiac Activity:   Observed  Fetal Lie:          Upper Right Fetus  Presentation:       Cephalic  Placenta:           Posterior, above cervical os  P. Cord Insertion:  Previously Visualized  Amniotic Fluid  AFI FV:      Polyhydramnios                              Largest Pocket(cm)                              10.7 ---------------------------------------------------------------------- Biophysical Evaluation (Fetus B)  Amniotic F.V:   Polyhydramnios             F. Tone:        Observed  F. Movement:    Observed                   Score:          8/8  F. Breathing:   Observed ---------------------------------------------------------------------- Biometry (Fetus B)  BPD:      75.7  mm     G. Age:  30w 3d         21  %    CI:        71.06   %    70 - 86  FL/HC:      19.5   %    19.3 - 21.3  HC:      286.1  mm     G. Age:  31w 3d         25  %    HC/AC:      1.03        0.96 - 1.17  AC:       278   mm     G. Age:  31w 6d         72  %    FL/BPD:     73.8   %    71 - 87  FL:       55.9  mm     G. Age:  29w 3d          7  %    FL/AC:      20.1   %    20 - 24  HUM:      50.8  mm     G. Age:  29w 5d         25  %  Est. FW:    1670  gm    3 lb 11 oz      54  %     FW Discordancy     0 \ 21 %  ---------------------------------------------------------------------- Gestational Age (Fetus B)  LMP:           31w 0d        Date:  11/21/16                 EDD:   08/28/17  U/S Today:     30w 6d                                        EDD:   08/29/17  Best:          31w 0d     Det. By:  LMP  (11/21/16)          EDD:   08/28/17 ---------------------------------------------------------------------- Anatomy (Fetus B)  Cranium:               Appears normal         Aortic Arch:            Appears normal  Cavum:                 Appears normal         Ductal Arch:            Previously seen  Ventricles:            Appears normal         Diaphragm:              Appears normal  Choroid Plexus:        Appears normal         Stomach:                Appears normal, left  sided  Cerebellum:            Appears normal         Abdomen:                Appears normal  Posterior Fossa:       Appears normal         Abdominal Wall:         Appears nml (cord                                                                        insert, abd wall)  Nuchal Fold:           Previously seen        Cord Vessels:           Appears normal (3                                                                        vessel cord)  Face:                  Appears normal         Kidneys:                Appear normal                         (orbits and profile)  Lips:                  Appears normal         Bladder:                Appears normal  Thoracic:              Appears normal         Spine:                  Previously seen  Heart:                 Appears normal         Upper Extremities:      Previously seen                         (4CH, axis, and                         situs)  RVOT:                  Previously seen        Lower Extremities:      Previously seen  LVOT:                  Appears normal  Other:  Female gender. Heels, 5th digit, and Nasal bone previously           visualized. Technically difficult due to fetal position. ----------------------------------------------------------------------  Doppler - Fetal Vessels (Fetus B)  Umbilical Artery   S/D     %tile     RI              PI              PSV    ADFV    RDFV                                                   (cm/s)  2.92       57   0.66             1.02             82.05      No      No ---------------------------------------------------------------------- Cervix Uterus Adnexa  Cervix  Not visualized (advanced GA >29wks)  Uterus  No abnormality visualized.  Left Ovary  Not visualized.  Right Ovary  Not visualized.  Cul De Sac:   No free fluid seen.  Adnexa:       No abnormality visualized. ---------------------------------------------------------------------- Impression  Monochorionic diamniotic twin pregnancy at 31+0 weeks,  here for growth evaluation and TTTS check  Normal amniotic fluid volume for twin A, but mild  polyhydramnios on twin B with MVPs of 3.6cm and 10.7 cm  respectively  Interval review of anatomy is normal for both twins: both twins  have visible bladders and stomachs  Growth is in the 19th percentile for A and 51st percentile for  B/ A's abdominal circumfrence is <3rd percentile.  Discordance was 21%  BPP 8/8 in both twins  Umbilical artery dopplers were obtained from both twins, and  both were normal; neither had absent or reversed end  diastolic flow ---------------------------------------------------------------------- Recommendations  Discussed findings with patient and family. This does not  appear to be developing TTTS, but we will begin weekly  evaluations due to IUGR of A and polyhydramnios of B, with  BPP and dopplers next week ----------------------------------------------------------------------               Eden Lathe, BS RDMS RVT Electronically Signed Corrected Final Report  06/26/2017 05:57 pm ----------------------------------------------------------------------  [place] Mfm Ob Follow Up  Addl Gest  Result Date: 06/26/2017 ----------------------------------------------------------------------  OBSTETRICS REPORT                        (Corrected Final 06/26/2017 05:57                                                                          pm) ---------------------------------------------------------------------- Patient Info  ID #:       [id number]                          D.O.B.:  1991/01/12 (26 yrs)  Name:       Sharon Chandler            Visit Date: 06/26/2017 10:19 am ---------------------------------------------------------------------- Performed By  Performed By:     Eden Lathe BS  Ref. Address:     [address]                    RDMS RVT                                                             [address]                                                             [place], [place]                                                             [address]  Attending:        Charlsie Merles MD         Location:         [facility]  Referred By:      Tereso Newcomer MD ---------------------------------------------------------------------- Orders   #  Description                                 Code   1  [place] MFM OB FOLLOW UP                         [phone]   2  [place] MFM OB FOLLOW UP ADDL GEST               81191.47   3  [place] MFM FETAL BPP WO NON STRESS              76819.01   4  [place] MFM FETAL BPP WO NST ADDL                82956.2      GESTATION   5  [place] MFM UA CORD DOPPLER                      76820.02   6  [place] MFM UA ADDL GEST                         76820.01  ----------------------------------------------------------------------   #  Ordered By               Order #        Accession #    Episode #   1  Particia Nearing            [id number]      [id number]     [id number]   2  MARTHA DECKER            [id number]      [id number]     [id number]   3  MARTHA  DECKER            [id number]      [id number]     [id number]   4  MARTHA DECKER            [id number]      [id number]     [id number]   5   MARTHA DECKER            [id number]      [id number]     [id number]   6  MARTHA DECKER            [id number]      [id number]     [id number]  ---------------------------------------------------------------------- Indications   [redacted] weeks gestation of pregnancy                Z3A.31   Short interval between pregancies, 3rd         O09.893   trimester (BMZ 9/13, 9/14)   Twin pregnancy, mono/di, third trimester       O30.033   Maternal care for known or suspected poor      O36.5931   fetal growth, third trimester, fetus 1  ---------------------------------------------------------------------- OB History  Blood Type:            Height:  5'5"   Weight (lb):  140       BMI:  23.29  Gravidity:    3         Term:   2  Living:       2 ---------------------------------------------------------------------- Fetal Evaluation (Fetus A)  Num Of Fetuses:     2  Fetal Heart         151  Rate(bpm):  Cardiac Activity:   Observed  Fetal Lie:          Lower Left Fetus  Presentation:       Breech  Placenta:           Posterior, above cervical os  P. Cord Insertion:  Previously Visualized  Amniotic Fluid  AFI FV:      Subjectively within normal limits                              Largest Pocket(cm)                              3.61 ---------------------------------------------------------------------- Biophysical Evaluation (Fetus A)  Amniotic F.V:   Pocket => 2 cm two         F. Tone:        Observed                  planes  F. Movement:    Observed                   Score:          8/8  F. Breathing:   Observed ---------------------------------------------------------------------- Biometry (Fetus A)  BPD:      74.3  mm     G. Age:  29w 6d         10  %    CI:        73.34   %    70 - 86  FL/HC:      19.9   %    19.3 - 21.3  HC:      275.7  mm     G. Age:  30w 1d          5  %    HC/AC:      1.13        0.96 - 1.17  AC:      244.5  mm     G. Age:  28w 5d        < 3  %    FL/BPD:     74.0    %    71 - 87  FL:         55  mm     G. Age:  29w 0d          3  %    FL/AC:      22.5   %    20 - 24  HUM:      50.4  mm     G. Age:  29w 4d         21  %  Est. FW:    1323  gm    2 lb 15 oz      19  %     FW Discordancy        21  % ---------------------------------------------------------------------- Gestational Age (Fetus A)  LMP:           31w 0d        Date:  11/21/16                 EDD:   08/28/17  U/S Today:     29w 3d                                        EDD:   09/08/17  Best:          31w 0d     Det. By:  LMP  (11/21/16)          EDD:   08/28/17 ---------------------------------------------------------------------- Anatomy (Fetus A)  Cranium:               Appears normal         Aortic Arch:            Appears normal  Cavum:                 Appears normal         Ductal Arch:            Previously seen  Ventricles:            Appears normal         Diaphragm:              Previously seen  Choroid Plexus:        Appears normal         Stomach:                Appears normal, left  sided  Cerebellum:            Appears normal         Abdomen:                Appears normal  Posterior Fossa:       Appears normal         Abdominal Wall:         Previously seen  Nuchal Fold:           Previously seen        Cord Vessels:           Appears normal (3                                                                        vessel cord)  Face:                  Orbits and profile     Kidneys:                Appear normal                         previously seen  Lips:                  Appears normal         Bladder:                Appears normal  Thoracic:              Appears normal         Spine:                  Previously seen  Heart:                 Appears normal         Upper Extremities:      Previously seen                         (4CH, axis, and                         situs)  RVOT:                  Previously seen        Lower  Extremities:      Previously seen  LVOT:                  Previously seen  Other:  Female gender. Heels and Nasal bone previously visualized.          Technically difficult due to fetal position. ---------------------------------------------------------------------- Doppler - Fetal Vessels (Fetus A)  Umbilical Artery   S/D     %tile     RI              PI              PSV    ADFV    RDFV                                                   (  cm/s)   3.9       93   0.74             1.23             71.33      No      No ---------------------------------------------------------------------- Fetal Evaluation (Fetus B)  Num Of Fetuses:     2  Fetal Heart         143  Rate(bpm):  Cardiac Activity:   Observed  Fetal Lie:          Upper Right Fetus  Presentation:       Cephalic  Placenta:           Posterior, above cervical os  P. Cord Insertion:  Previously Visualized  Amniotic Fluid  AFI FV:      Polyhydramnios                              Largest Pocket(cm)                              10.7 ---------------------------------------------------------------------- Biophysical Evaluation (Fetus B)  Amniotic F.V:   Polyhydramnios             F. Tone:        Observed  F. Movement:    Observed                   Score:          8/8  F. Breathing:   Observed ---------------------------------------------------------------------- Biometry (Fetus B)  BPD:      75.7  mm     G. Age:  30w 3d         21  %    CI:        71.06   %    70 - 86                                                          FL/HC:      19.5   %    19.3 - 21.3  HC:      286.1  mm     G. Age:  31w 3d         25  %    HC/AC:      1.03        0.96 - 1.17  AC:       278   mm     G. Age:  31w 6d         72  %    FL/BPD:     73.8   %    71 - 87  FL:       55.9  mm     G. Age:  29w 3d          7  %    FL/AC:      20.1   %    20 - 24  HUM:      50.8  mm     G. Age:  29w 5d         25  %  Est. FW:    1670  gm  3 lb 11 oz      54  %     FW Discordancy     0 \ 21 %  ---------------------------------------------------------------------- Gestational Age (Fetus B)  LMP:           31w 0d        Date:  11/21/16                 EDD:   08/28/17  U/S Today:     30w 6d                                        EDD:   08/29/17  Best:          31w 0d     Det. By:  LMP  (11/21/16)          EDD:   08/28/17 ---------------------------------------------------------------------- Anatomy (Fetus B)  Cranium:               Appears normal         Aortic Arch:            Appears normal  Cavum:                 Appears normal         Ductal Arch:            Previously seen  Ventricles:            Appears normal         Diaphragm:              Appears normal  Choroid Plexus:        Appears normal         Stomach:                Appears normal, left                                                                        sided  Cerebellum:            Appears normal         Abdomen:                Appears normal  Posterior Fossa:       Appears normal         Abdominal Wall:         Appears nml (cord                                                                        insert, abd wall)  Nuchal Fold:           Previously seen        Cord Vessels:           Appears normal (3  vessel cord)  Face:                  Appears normal         Kidneys:                Appear normal                         (orbits and profile)  Lips:                  Appears normal         Bladder:                Appears normal  Thoracic:              Appears normal         Spine:                  Previously seen  Heart:                 Appears normal         Upper Extremities:      Previously seen                         (4CH, axis, and                         situs)  RVOT:                  Previously seen        Lower Extremities:      Previously seen  LVOT:                  Appears normal  Other:  Female gender. Heels, 5th digit, and Nasal bone previously           visualized. Technically difficult due to fetal position. ---------------------------------------------------------------------- Doppler - Fetal Vessels (Fetus B)  Umbilical Artery   S/D     %tile     RI              PI              PSV    ADFV    RDFV                                                   (cm/s)  2.92       57   0.66             1.02             82.05      No      No ---------------------------------------------------------------------- Cervix Uterus Adnexa  Cervix  Not visualized (advanced GA >29wks)  Uterus  No abnormality visualized.  Left Ovary  Not visualized.  Right Ovary  Not visualized.  Cul De Sac:   No free fluid seen.  Adnexa:       No abnormality visualized. ---------------------------------------------------------------------- Impression  Monochorionic diamniotic twin pregnancy at 31+0 weeks,  here for growth evaluation and TTTS check  Normal amniotic fluid volume for twin A, but mild  polyhydramnios on twin B with MVPs of 3.6cm and 10.7 cm  respectively  Interval review  of anatomy is normal for both twins: both twins  have visible bladders and stomachs  Growth is in the 19th percentile for A and 51st percentile for  B/ A's abdominal circumfrence is <3rd percentile.  Discordance was 21%  BPP 8/8 in both twins  Umbilical artery dopplers were obtained from both twins, and  both were normal; neither had absent or reversed end  diastolic flow ---------------------------------------------------------------------- Recommendations  Discussed findings with patient and family. This does not  appear to be developing TTTS, but we will begin weekly  evaluations due to IUGR of A and polyhydramnios of B, with  BPP and dopplers next week ----------------------------------------------------------------------               Eden Lathe, BS RDMS RVT Electronically Signed Corrected Final Report  06/26/2017 05:57 pm ----------------------------------------------------------------------  [place] Mfm Ob  Limited  Result Date: 06/12/2017 ----------------------------------------------------------------------  OBSTETRICS REPORT                      (Signed Final 06/12/2017 10:30 am) ---------------------------------------------------------------------- Patient Info  ID #:       [id number]                          D.O.B.:  1991/06/06 (26 yrs)  Name:       Sharon Chandler            Visit Date: 06/12/2017 09:54 am ---------------------------------------------------------------------- Performed By  Performed By:     Eden Lathe BS      Ref. Address:     [address]                    RDMS RVT                                                             [address]                                                             [place], [place]                                                             [address]  Attending:        Charlsie Merles MD         Location:         [facility]  Referred By:      Tereso Newcomer MD ---------------------------------------------------------------------- Orders   #  Description                                 Code   1  [place] MFM OB LIMITED  62952.84  ----------------------------------------------------------------------   #  Ordered By               Order #        Accession #    Episode #   1  Alpha Gula            [id number]      [id number]     [id number]  ---------------------------------------------------------------------- Indications   [redacted] weeks gestation of pregnancy                Z3A.29   Short interval between pregancies, 2nd         O09.892   trimester   Twin pregnancy, mono/di, third trimester       O30.033  ---------------------------------------------------------------------- OB History  Blood Type:            Height:  5'5"   Weight (lb):  140       BMI:  23.29  Gravidity:    3         Term:   2  Living:       2 ---------------------------------------------------------------------- Fetal Evaluation (Fetus A)  Num Of Fetuses:     2   Fetal Heart         157  Rate(bpm):  Cardiac Activity:   Observed  Fetal Lie:          Lower Left Fetus  Presentation:       Breech  Placenta:           Posterior, above cervical os  P. Cord Insertion:  Previously Visualized  Amniotic Fluid  AFI FV:      Subjectively within normal limits                              Largest Pocket(cm)                              2.8 ---------------------------------------------------------------------- Gestational Age (Fetus A)  LMP:           29w 0d        Date:  11/21/16                 EDD:   08/28/17  Best:          29w 0d     Det. By:  LMP  (11/21/16)          EDD:   08/28/17 ---------------------------------------------------------------------- Fetal Evaluation (Fetus B)  Num Of Fetuses:     2  Fetal Heart         148  Rate(bpm):  Cardiac Activity:   Observed  Fetal Lie:          Upper Right Fetus  Presentation:       Cephalic  Placenta:           Posterior, above cervical os  P. Cord Insertion:  Visualized  Amniotic Fluid  AFI FV:      Subjectively within normal limits                              Largest Pocket(cm)                              5.15 ---------------------------------------------------------------------- Gestational Age (Fetus B)  LMP:  29w 0d        Date:  11/21/16                 EDD:   08/28/17  Best:          29w 0d     Det. By:  LMP  (11/21/16)          EDD:   08/28/17 ---------------------------------------------------------------------- Cervix Uterus Adnexa  Cervix  Not visualized (advanced GA >29wks) ---------------------------------------------------------------------- Impression  Monochorionic diamniotic twin pregnancy at 29+0 weeks,  here for TTTS check  Normal amniotic fluid volume for both twins  Both twins have visible bladders and stomachs  Normal movement and cardiac activity in both twins  No signs of twin-twin transfusion. ---------------------------------------------------------------------- Recommendations  Continue current [place]  evaluation regimen ----------------------------------------------------------------------                 Charlsie Merles, MD Electronically Signed Final Report   06/12/2017 10:30 am ----------------------------------------------------------------------  [place] Mfm Ua Addl Gest  Result Date: 06/26/2017 ----------------------------------------------------------------------  OBSTETRICS REPORT                        (Corrected Final 06/26/2017 05:57                                                                          pm) ---------------------------------------------------------------------- Patient Info  ID #:       [id number]                          D.O.B.:  [birth year] (26 yrs)  Name:       Sharon Chandler            Visit Date: 06/26/2017 10:19 am ---------------------------------------------------------------------- Performed By  Performed By:     Eden Lathe BS      Ref. Address:     [address]                    RDMS RVT                                                             [address]                                                             [place], [place]                                                             [address]  Attending:        Charlsie Merles MD  [facility]  Referred By:      Tereso Newcomer MD ---------------------------------------------------------------------- Orders   #  Description                                 Code   1  [place] MFM OB FOLLOW UP                         76816.01   2  [place] MFM OB FOLLOW UP ADDL GEST               76816.02   3  [place] MFM FETAL BPP WO NON STRESS              76819.01   4  [place] MFM FETAL BPP WO NST ADDL                [address].6      GESTATION   5  [place] MFM UA CORD DOPPLER                      76820.02   6  [place] MFM UA ADDL GEST                         76820.01  ----------------------------------------------------------------------   #  Ordered By               Order #        Accession #    Episode #   1  Particia Nearing             [id number]      [id number]     [id number]   2  MARTHA DECKER            [id number]      [id number]     [id number]   3  MARTHA DECKER            [id number]      [id number]     [id number]   4  MARTHA DECKER            [id number]      [id number]     [id number]   5  MARTHA DECKER            [id number]      [id number]     [id number]   6  MARTHA DECKER            [id number]      [id number]     [id number]  ---------------------------------------------------------------------- Indications   [redacted] weeks gestation of pregnancy                Z3A.31   Short interval between pregancies, 3rd         O09.893   trimester (BMZ 9/13, 9/14)   Twin pregnancy, mono/di, third trimester       O30.033   Maternal care for known or suspected poor      O36.5931   fetal growth, third trimester, fetus 1  ---------------------------------------------------------------------- OB History  Blood Type:            Height:  5'5"   Weight (lb):  140       BMI:  23.29  Gravidity:    3         Term:   2  Living:       2 ---------------------------------------------------------------------- Fetal Evaluation (Fetus A)  Num Of Fetuses:     2  Fetal Heart         151  Rate(bpm):  Cardiac Activity:   Observed  Fetal Lie:          Lower Left Fetus  Presentation:       Breech  Placenta:           Posterior, above cervical os  P. Cord Insertion:  Previously Visualized  Amniotic Fluid  AFI FV:      Subjectively within normal limits                              Largest Pocket(cm)                              3.61 ---------------------------------------------------------------------- Biophysical Evaluation (Fetus A)  Amniotic F.V:   Pocket => 2 cm two         F. Tone:        Observed                  planes  F. Movement:    Observed                   Score:          8/8  F. Breathing:   Observed ---------------------------------------------------------------------- Biometry (Fetus A)  BPD:      74.3  mm     G. Age:  29w 6d         10  %    CI:        73.34   %    70 - 86                                                           FL/HC:      19.9   %    19.3 - 21.3  HC:      275.7  mm     G. Age:  30w 1d          5  %    HC/AC:      1.13        0.96 - 1.17  AC:      244.5  mm     G. Age:  28w 5d        < 3  %    FL/BPD:     74.0   %    71 - 87  FL:         55  mm     G. Age:  29w 0d          3  %    FL/AC:      22.5   %    20 - 24  HUM:      50.4  mm     G. Age:  29w 4d         21  %  Est. FW:    1323  gm    2  lb 15 oz      19  %     FW Discordancy        21  % ---------------------------------------------------------------------- Gestational Age (Fetus A)  LMP:           31w 0d        Date:  11/21/16                 EDD:   08/28/17  U/S Today:     29w 3d                                        EDD:   09/08/17  Best:          31w 0d     Det. By:  LMP  (11/21/16)          EDD:   08/28/17 ---------------------------------------------------------------------- Anatomy (Fetus A)  Cranium:               Appears normal         Aortic Arch:            Appears normal  Cavum:                 Appears normal         Ductal Arch:            Previously seen  Ventricles:            Appears normal         Diaphragm:              Previously seen  Choroid Plexus:        Appears normal         Stomach:                Appears normal, left                                                                        sided  Cerebellum:            Appears normal         Abdomen:                Appears normal  Posterior Fossa:       Appears normal         Abdominal Wall:         Previously seen  Nuchal Fold:           Previously seen        Cord Vessels:           Appears normal (3                                                                        vessel cord)  Face:  Orbits and profile     Kidneys:                Appear normal                         previously seen  Lips:                  Appears normal         Bladder:                Appears normal  Thoracic:              Appears normal          Spine:                  Previously seen  Heart:                 Appears normal         Upper Extremities:      Previously seen                         (4CH, axis, and                         situs)  RVOT:                  Previously seen        Lower Extremities:      Previously seen  LVOT:                  Previously seen  Other:  Female gender. Heels and Nasal bone previously visualized.          Technically difficult due to fetal position. ---------------------------------------------------------------------- Doppler - Fetal Vessels (Fetus A)  Umbilical Artery   S/D     %tile     RI              PI              PSV    ADFV    RDFV                                                   (cm/s)   3.9       93   0.74             1.23             71.33      No      No ---------------------------------------------------------------------- Fetal Evaluation (Fetus B)  Num Of Fetuses:     2  Fetal Heart         143  Rate(bpm):  Cardiac Activity:   Observed  Fetal Lie:          Upper Right Fetus  Presentation:       Cephalic  Placenta:           Posterior, above cervical os  P. Cord Insertion:  Previously Visualized  Amniotic Fluid  AFI FV:      Polyhydramnios  Largest Pocket(cm)                              10.7 ---------------------------------------------------------------------- Biophysical Evaluation (Fetus B)  Amniotic F.V:   Polyhydramnios             F. Tone:        Observed  F. Movement:    Observed                   Score:          8/8  F. Breathing:   Observed ---------------------------------------------------------------------- Biometry (Fetus B)  BPD:      75.7  mm     G. Age:  30w 3d         21  %    CI:        71.06   %    70 - 86                                                          FL/HC:      19.5   %    19.3 - 21.3  HC:      286.1  mm     G. Age:  31w 3d         25  %    HC/AC:      1.03        0.96 - 1.17  AC:       278   mm     G. Age:  31w 6d         72  %    FL/BPD:      73.8   %    71 - 87  FL:       55.9  mm     G. Age:  29w 3d          7  %    FL/AC:      20.1   %    20 - 24  HUM:      50.8  mm     G. Age:  29w 5d         25  %  Est. FW:    1670  gm    3 lb 11 oz      54  %     FW Discordancy     0 \ 21 % ---------------------------------------------------------------------- Gestational Age (Fetus B)  LMP:           31w 0d        Date:  11/21/16                 EDD:   08/28/17  U/S Today:     30w 6d                                        EDD:   08/29/17  Best:          31w 0d     Det. By:  LMP  (11/21/16)          EDD:   08/28/17 ---------------------------------------------------------------------- Anatomy (Fetus B)  Cranium:  Appears normal         Aortic Arch:            Appears normal  Cavum:                 Appears normal         Ductal Arch:            Previously seen  Ventricles:            Appears normal         Diaphragm:              Appears normal  Choroid Plexus:        Appears normal         Stomach:                Appears normal, left                                                                        sided  Cerebellum:            Appears normal         Abdomen:                Appears normal  Posterior Fossa:       Appears normal         Abdominal Wall:         Appears nml (cord                                                                        insert, abd wall)  Nuchal Fold:           Previously seen        Cord Vessels:           Appears normal (3                                                                        vessel cord)  Face:                  Appears normal         Kidneys:                Appear normal                         (orbits and profile)  Lips:                  Appears normal         Bladder:                Appears normal  Thoracic:  Appears normal         Spine:                  Previously seen  Heart:                 Appears normal         Upper Extremities:      Previously seen                         (4CH,  axis, and                         situs)  RVOT:                  Previously seen        Lower Extremities:      Previously seen  LVOT:                  Appears normal  Other:  Female gender. Heels, 5th digit, and Nasal bone previously          visualized. Technically difficult due to fetal position. ---------------------------------------------------------------------- Doppler - Fetal Vessels (Fetus B)  Umbilical Artery   S/D     %tile     RI              PI              PSV    ADFV    RDFV                                                   (cm/s)  2.92       57   0.66             1.02             82.05      No      No ---------------------------------------------------------------------- Cervix Uterus Adnexa  Cervix  Not visualized (advanced GA >29wks)  Uterus  No abnormality visualized.  Left Ovary  Not visualized.  Right Ovary  Not visualized.  Cul De Sac:   No free fluid seen.  Adnexa:       No abnormality visualized. ---------------------------------------------------------------------- Impression  Monochorionic diamniotic twin pregnancy at 31+0 weeks,  here for growth evaluation and TTTS check  Normal amniotic fluid volume for twin A, but mild  polyhydramnios on twin B with MVPs of 3.6cm and 10.7 cm  respectively  Interval review of anatomy is normal for both twins: both twins  have visible bladders and stomachs  Growth is in the 19th percentile for A and 51st percentile for  B/ A's abdominal circumfrence is <3rd percentile.  Discordance was 21%  BPP 8/8 in both twins  Umbilical artery dopplers were obtained from both twins, and  both were normal; neither had absent or reversed end  diastolic flow ---------------------------------------------------------------------- Recommendations  Discussed findings with patient and family. This does not  appear to be developing TTTS, but we will begin weekly  evaluations due to IUGR of A and polyhydramnios of B, with  BPP and dopplers next week  ----------------------------------------------------------------------               Eden Lathe, BS RDMS RVT Electronically Signed Corrected Final Report  06/26/2017  05:57 pm ----------------------------------------------------------------------  [place] Mfm Ua Cord Doppler  Result Date: 06/26/2017 ----------------------------------------------------------------------  OBSTETRICS REPORT                        (Corrected Final 06/26/2017 05:57                                                                          pm) ---------------------------------------------------------------------- Patient Info  ID #:       [id number]                          D.O.B.:  December 02, 1990 (26 yrs)  Name:       Sharon Chandler            Visit Date: 06/26/2017 10:19 am ---------------------------------------------------------------------- Performed By  Performed By:     Eden Lathe BS      Ref. Address:     [address]                    RDMS RVT                                                             [address]                                                             [place], [place]                                                             [address]  Attending:        Charlsie Merles MD         Location:         [facility]  Referred By:      Tereso Newcomer MD ---------------------------------------------------------------------- Orders   #  Description                                 Code   1  [place] MFM OB FOLLOW UP                         19147.82   2  [place] MFM OB FOLLOW UP ADDL GEST               95621.30   3  [place] MFM FETAL BPP WO NON STRESS  91478.29   4  [place] MFM FETAL BPP WO NST ADDL                76819.1      GESTATION   5  [place] MFM UA CORD DOPPLER                      76820.02   6  [place] MFM UA ADDL GEST                         76820.01  ----------------------------------------------------------------------   #  Ordered By               Order #        Accession #    Episode #   1  Particia Nearing             [id number]      [id number]     [id number]   2  MARTHA DECKER            [id number]      [id number]     [id number]   3  MARTHA DECKER            [id number]      [id number]     [id number]   4  MARTHA DECKER            [id number]      [id number]     [id number]   5  MARTHA DECKER            [id number]      [id number]     [id number]   6  MARTHA DECKER            [id number]      [id number]     [id number]  ---------------------------------------------------------------------- Indications   [redacted] weeks gestation of pregnancy                Z3A.31   Short interval between pregancies, 3rd         O09.893   trimester (BMZ 9/13, 9/14)   Twin pregnancy, mono/di, third trimester       O30.033   Maternal care for known or suspected poor      O36.5931   fetal growth, third trimester, fetus 1  ---------------------------------------------------------------------- OB History  Blood Type:            Height:  5'5"   Weight (lb):  140       BMI:  23.29  Gravidity:    3         Term:   2  Living:       2 ---------------------------------------------------------------------- Fetal Evaluation (Fetus A)  Num Of Fetuses:     2  Fetal Heart         151  Rate(bpm):  Cardiac Activity:   Observed  Fetal Lie:          Lower Left Fetus  Presentation:       Breech  Placenta:           Posterior, above cervical os  P. Cord Insertion:  Previously Visualized  Amniotic Fluid  AFI FV:      Subjectively within normal limits                              Largest Pocket(cm)  3.61 ---------------------------------------------------------------------- Biophysical Evaluation (Fetus A)  Amniotic F.V:   Pocket => 2 cm two         F. Tone:        Observed                  planes  F. Movement:    Observed                   Score:          8/8  F. Breathing:   Observed ---------------------------------------------------------------------- Biometry (Fetus A)  BPD:      74.3  mm     G. Age:  29w 6d         10  %    CI:        73.34   %    70 -  86                                                          FL/HC:      19.9   %    19.3 - 21.3  HC:      275.7  mm     G. Age:  30w 1d          5  %    HC/AC:      1.13        0.96 - 1.17  AC:      244.5  mm     G. Age:  28w 5d        < 3  %    FL/BPD:     74.0   %    71 - 87  FL:         55  mm     G. Age:  29w 0d          3  %    FL/AC:      22.5   %    20 - 24  HUM:      50.4  mm     G. Age:  29w 4d         21  %  Est. FW:    1323  gm    2 lb 15 oz      19  %     FW Discordancy        21  % ---------------------------------------------------------------------- Gestational Age (Fetus A)  LMP:           31w 0d        Date:  11/21/16                 EDD:   08/28/17  U/S Today:     29w 3d                                        EDD:   09/08/17  Best:          31w 0d     Det. By:  LMP  (11/21/16)          EDD:   08/28/17 ---------------------------------------------------------------------- Anatomy (Fetus A)  Cranium:  Appears normal         Aortic Arch:            Appears normal  Cavum:                 Appears normal         Ductal Arch:            Previously seen  Ventricles:            Appears normal         Diaphragm:              Previously seen  Choroid Plexus:        Appears normal         Stomach:                Appears normal, left                                                                        sided  Cerebellum:            Appears normal         Abdomen:                Appears normal  Posterior Fossa:       Appears normal         Abdominal Wall:         Previously seen  Nuchal Fold:           Previously seen        Cord Vessels:           Appears normal (3                                                                        vessel cord)  Face:                  Orbits and profile     Kidneys:                Appear normal                         previously seen  Lips:                  Appears normal         Bladder:                Appears normal  Thoracic:              Appears normal          Spine:                  Previously seen  Heart:                 Appears normal         Upper Extremities:      Previously seen                         (  4CH, axis, and                         situs)  RVOT:                  Previously seen        Lower Extremities:      Previously seen  LVOT:                  Previously seen  Other:  Female gender. Heels and Nasal bone previously visualized.          Technically difficult due to fetal position. ---------------------------------------------------------------------- Doppler - Fetal Vessels (Fetus A)  Umbilical Artery   S/D     %tile     RI              PI              PSV    ADFV    RDFV                                                   (cm/s)   3.9       93   0.74             1.23             71.33      No      No ---------------------------------------------------------------------- Fetal Evaluation (Fetus B)  Num Of Fetuses:     2  Fetal Heart         143  Rate(bpm):  Cardiac Activity:   Observed  Fetal Lie:          Upper Right Fetus  Presentation:       Cephalic  Placenta:           Posterior, above cervical os  P. Cord Insertion:  Previously Visualized  Amniotic Fluid  AFI FV:      Polyhydramnios                              Largest Pocket(cm)                              10.7 ---------------------------------------------------------------------- Biophysical Evaluation (Fetus B)  Amniotic F.V:   Polyhydramnios             F. Tone:        Observed  F. Movement:    Observed                   Score:          8/8  F. Breathing:   Observed ---------------------------------------------------------------------- Biometry (Fetus B)  BPD:      75.7  mm     G. Age:  30w 3d         21  %    CI:        71.06   %    70 - 86  FL/HC:      19.5   %    19.3 - 21.3  HC:      286.1  mm     G. Age:  31w 3d         25  %    HC/AC:      1.03        0.96 - 1.17  AC:       278   mm     G. Age:  31w 6d         72  %    FL/BPD:      73.8   %    71 - 87  FL:       55.9  mm     G. Age:  29w 3d          7  %    FL/AC:      20.1   %    20 - 24  HUM:      50.8  mm     G. Age:  29w 5d         25  %  Est. FW:    1670  gm    3 lb 11 oz      54  %     FW Discordancy     0 \ 21 % ---------------------------------------------------------------------- Gestational Age (Fetus B)  LMP:           31w 0d        Date:  11/21/16                 EDD:   08/28/17  U/S Today:     30w 6d                                        EDD:   08/29/17  Best:          31w 0d     Det. By:  LMP  (11/21/16)          EDD:   08/28/17 ---------------------------------------------------------------------- Anatomy (Fetus B)  Cranium:               Appears normal         Aortic Arch:            Appears normal  Cavum:                 Appears normal         Ductal Arch:            Previously seen  Ventricles:            Appears normal         Diaphragm:              Appears normal  Choroid Plexus:        Appears normal         Stomach:                Appears normal, left  sided  Cerebellum:            Appears normal         Abdomen:                Appears normal  Posterior Fossa:       Appears normal         Abdominal Wall:         Appears nml (cord                                                                        insert, abd wall)  Nuchal Fold:           Previously seen        Cord Vessels:           Appears normal (3                                                                        vessel cord)  Face:                  Appears normal         Kidneys:                Appear normal                         (orbits and profile)  Lips:                  Appears normal         Bladder:                Appears normal  Thoracic:              Appears normal         Spine:                  Previously seen  Heart:                 Appears normal         Upper Extremities:      Previously seen                         (4CH,  axis, and                         situs)  RVOT:                  Previously seen        Lower Extremities:      Previously seen  LVOT:                  Appears normal  Other:  Female gender. Heels, 5th digit, and Nasal bone previously          visualized. Technically difficult due to fetal position. ----------------------------------------------------------------------  Doppler - Fetal Vessels (Fetus B)  Umbilical Artery   S/D     %tile     RI              PI              PSV    ADFV    RDFV                                                   (cm/s)  2.92       57   0.66             1.02             82.05      No      No ---------------------------------------------------------------------- Cervix Uterus Adnexa  Cervix  Not visualized (advanced GA >29wks)  Uterus  No abnormality visualized.  Left Ovary  Not visualized.  Right Ovary  Not visualized.  Cul De Sac:   No free fluid seen.  Adnexa:       No abnormality visualized. ---------------------------------------------------------------------- Impression  Monochorionic diamniotic twin pregnancy at 31+0 weeks,  here for growth evaluation and TTTS check  Normal amniotic fluid volume for twin A, but mild  polyhydramnios on twin B with MVPs of 3.6cm and 10.7 cm  respectively  Interval review of anatomy is normal for both twins: both twins  have visible bladders and stomachs  Growth is in the 19th percentile for A and 51st percentile for  B/ A's abdominal circumfrence is <3rd percentile.  Discordance was 21%  BPP 8/8 in both twins  Umbilical artery dopplers were obtained from both twins, and  both were normal; neither had absent or reversed end  diastolic flow ---------------------------------------------------------------------- Recommendations  Discussed findings with patient and family. This does not  appear to be developing TTTS, but we will begin weekly  evaluations due to IUGR of A and polyhydramnios of B, with  BPP and dopplers next week  ----------------------------------------------------------------------               Eden Lathe, BS RDMS RVT Electronically Signed Corrected Final Report  06/26/2017 05:57 pm ----------------------------------------------------------------------  [place] Mfm Fetal Bpp Wo Nst Addl Gestation  Result Date: 06/26/2017 ----------------------------------------------------------------------  OBSTETRICS REPORT                        (Corrected Final 06/26/2017 05:57                                                                          pm) ---------------------------------------------------------------------- Patient Info  ID #:       [id number]                          D.O.B.:  1991-06-14 (26 yrs)  Name:       Sharon Chandler            Visit Date: 06/26/2017 10:19 am ---------------------------------------------------------------------- Performed By  Performed By:     Eden Lathe BS  Ref. Address:     [address]                    RDMS RVT                                                             [address]                                                             [place], [place]                                                             [address]  Attending:        Charlsie Merles MD         Location:         [facility]  Referred By:      Tereso Newcomer MD ---------------------------------------------------------------------- Orders   #  Description                                 Code   1  [place] MFM OB FOLLOW UP                         [phone]   2  [place] MFM OB FOLLOW UP ADDL GEST               81191.47   3  [place] MFM FETAL BPP WO NON STRESS              76819.01   4  [place] MFM FETAL BPP WO NST ADDL                82956.2      GESTATION   5  [place] MFM UA CORD DOPPLER                      76820.02   6  [place] MFM UA ADDL GEST                         76820.01  ----------------------------------------------------------------------   #  Ordered By               Order #        Accession #    Episode #   1   Particia Nearing            [id number]      [id number]     [id number]   2  MARTHA DECKER            [id number]      [id number]     [id number]   3  [place] DECKER            [id number]      [id number]     [id number]   4  MARTHA DECKER            [id number]      [id number]     [id number]   5  MARTHA DECKER            [id number]      [id number]     [id number]   6  MARTHA DECKER            [id number]      [id number]     [id number]  ---------------------------------------------------------------------- Indications   [redacted] weeks gestation of pregnancy                Z3A.31   Short interval between pregancies, 3rd         O09.893   trimester (BMZ 9/13, 9/14)   Twin pregnancy, mono/di, third trimester       O30.033   Maternal care for known or suspected poor      O36.5931   fetal growth, third trimester, fetus 1  ---------------------------------------------------------------------- OB History  Blood Type:            Height:  5'5"   Weight (lb):  140       BMI:  23.29  Gravidity:    3         Term:   2  Living:       2 ---------------------------------------------------------------------- Fetal Evaluation (Fetus A)  Num Of Fetuses:     2  Fetal Heart         151  Rate(bpm):  Cardiac Activity:   Observed  Fetal Lie:          Lower Left Fetus  Presentation:       Breech  Placenta:           Posterior, above cervical os  P. Cord Insertion:  Previously Visualized  Amniotic Fluid  AFI FV:      Subjectively within normal limits                              Largest Pocket(cm)                              3.61 ---------------------------------------------------------------------- Biophysical Evaluation (Fetus A)  Amniotic F.V:   Pocket => 2 cm two         F. Tone:        Observed                  planes  F. Movement:    Observed                   Score:          8/8  F. Breathing:   Observed ---------------------------------------------------------------------- Biometry (Fetus A)  BPD:      74.3  mm     G. Age:  29w 6d         10  %    CI:         73.34   %    70 - 86  FL/HC:      19.9   %    19.3 - 21.3  HC:      275.7  mm     G. Age:  30w 1d          5  %    HC/AC:      1.13        0.96 - 1.17  AC:      244.5  mm     G. Age:  28w 5d        < 3  %    FL/BPD:     74.0   %    71 - 87  FL:         55  mm     G. Age:  29w 0d          3  %    FL/AC:      22.5   %    20 - 24  HUM:      50.4  mm     G. Age:  29w 4d         21  %  Est. FW:    1323  gm    2 lb 15 oz      19  %     FW Discordancy        21  % ---------------------------------------------------------------------- Gestational Age (Fetus A)  LMP:           31w 0d        Date:  11/21/16                 EDD:   08/28/17  U/S Today:     29w 3d                                        EDD:   09/08/17  Best:          31w 0d     Det. By:  LMP  (11/21/16)          EDD:   08/28/17 ---------------------------------------------------------------------- Anatomy (Fetus A)  Cranium:               Appears normal         Aortic Arch:            Appears normal  Cavum:                 Appears normal         Ductal Arch:            Previously seen  Ventricles:            Appears normal         Diaphragm:              Previously seen  Choroid Plexus:        Appears normal         Stomach:                Appears normal, left  sided  Cerebellum:            Appears normal         Abdomen:                Appears normal  Posterior Fossa:       Appears normal         Abdominal Wall:         Previously seen  Nuchal Fold:           Previously seen        Cord Vessels:           Appears normal (3                                                                        vessel cord)  Face:                  Orbits and profile     Kidneys:                Appear normal                         previously seen  Lips:                  Appears normal         Bladder:                Appears normal  Thoracic:               Appears normal         Spine:                  Previously seen  Heart:                 Appears normal         Upper Extremities:      Previously seen                         (4CH, axis, and                         situs)  RVOT:                  Previously seen        Lower Extremities:      Previously seen  LVOT:                  Previously seen  Other:  Female gender. Heels and Nasal bone previously visualized.          Technically difficult due to fetal position. ---------------------------------------------------------------------- Doppler - Fetal Vessels (Fetus A)  Umbilical Artery   S/D     %tile     RI              PI              PSV    ADFV    RDFV                                                   (  cm/s)   3.9       93   0.74             1.23             71.33      No      No ---------------------------------------------------------------------- Fetal Evaluation (Fetus B)  Num Of Fetuses:     2  Fetal Heart         143  Rate(bpm):  Cardiac Activity:   Observed  Fetal Lie:          Upper Right Fetus  Presentation:       Cephalic  Placenta:           Posterior, above cervical os  P. Cord Insertion:  Previously Visualized  Amniotic Fluid  AFI FV:      Polyhydramnios                              Largest Pocket(cm)                              10.7 ---------------------------------------------------------------------- Biophysical Evaluation (Fetus B)  Amniotic F.V:   Polyhydramnios             F. Tone:        Observed  F. Movement:    Observed                   Score:          8/8  F. Breathing:   Observed ---------------------------------------------------------------------- Biometry (Fetus B)  BPD:      75.7  mm     G. Age:  30w 3d         21  %    CI:        71.06   %    70 - 86                                                          FL/HC:      19.5   %    19.3 - 21.3  HC:      286.1  mm     G. Age:  31w 3d         25  %    HC/AC:      1.03        0.96 - 1.17  AC:       278   mm     G. Age:  31w 6d          72  %    FL/BPD:     73.8   %    71 - 87  FL:       55.9  mm     G. Age:  29w 3d          7  %    FL/AC:      20.1   %    20 - 24  HUM:      50.8  mm     G. Age:  29w 5d         25  %  Est. FW:    1670  gm  3 lb 11 oz      54  %     FW Discordancy     0 \ 21 % ---------------------------------------------------------------------- Gestational Age (Fetus B)  LMP:           31w 0d        Date:  11/21/16                 EDD:   08/28/17  U/S Today:     30w 6d                                        EDD:   08/29/17  Best:          31w 0d     Det. By:  LMP  (11/21/16)          EDD:   08/28/17 ---------------------------------------------------------------------- Anatomy (Fetus B)  Cranium:               Appears normal         Aortic Arch:            Appears normal  Cavum:                 Appears normal         Ductal Arch:            Previously seen  Ventricles:            Appears normal         Diaphragm:              Appears normal  Choroid Plexus:        Appears normal         Stomach:                Appears normal, left                                                                        sided  Cerebellum:            Appears normal         Abdomen:                Appears normal  Posterior Fossa:       Appears normal         Abdominal Wall:         Appears nml (cord                                                                        insert, abd wall)  Nuchal Fold:           Previously seen        Cord Vessels:           Appears normal (3  vessel cord)  Face:                  Appears normal         Kidneys:                Appear normal                         (orbits and profile)  Lips:                  Appears normal         Bladder:                Appears normal  Thoracic:              Appears normal         Spine:                  Previously seen  Heart:                 Appears normal         Upper Extremities:      Previously seen                          (4CH, axis, and                         situs)  RVOT:                  Previously seen        Lower Extremities:      Previously seen  LVOT:                  Appears normal  Other:  Female gender. Heels, 5th digit, and Nasal bone previously          visualized. Technically difficult due to fetal position. ---------------------------------------------------------------------- Doppler - Fetal Vessels (Fetus B)  Umbilical Artery   S/D     %tile     RI              PI              PSV    ADFV    RDFV                                                   (cm/s)  2.92       57   0.66             1.02             82.05      No      No ---------------------------------------------------------------------- Cervix Uterus Adnexa  Cervix  Not visualized (advanced GA >29wks)  Uterus  No abnormality visualized.  Left Ovary  Not visualized.  Right Ovary  Not visualized.  Cul De Sac:   No free fluid seen.  Adnexa:       No abnormality visualized. ---------------------------------------------------------------------- Impression  Monochorionic diamniotic twin pregnancy at 31+0 weeks,  here for growth evaluation and TTTS check  Normal amniotic fluid volume for twin A, but mild  polyhydramnios on twin B with MVPs of 3.6cm and 10.7 cm  respectively  Interval review  of anatomy is normal for both twins: both twins  have visible bladders and stomachs  Growth is in the 19th percentile for A and 51st percentile for  B/ A's abdominal circumfrence is <3rd percentile.  Discordance was 21%  BPP 8/8 in both twins  Umbilical artery dopplers were obtained from both twins, and  both were normal; neither had absent or reversed end  diastolic flow ---------------------------------------------------------------------- Recommendations  Discussed findings with patient and family. This does not  appear to be developing TTTS, but we will begin weekly  evaluations due to IUGR of A and polyhydramnios of B, with  BPP and dopplers next week  ----------------------------------------------------------------------               Eden Lathe, BS RDMS RVT Electronically Signed Corrected Final Report  06/26/2017 05:57 pm ----------------------------------------------------------------------   Assessment and Plan:  Pregnancy: G3P2002 at [redacted]w[redacted]d  1. Monochorionic diamniotic twin pregnancy, antepartum 2. Polyhydramnios in second trimester, antepartum complication, fetus 2 3. IUGR (intrauterine growth restriction) affecting care of mother, third trimester, fetus 1 Ultrasound reviewed with patient.  All questions assigned. Will follow up with MFM as scheduled, will follow up recommendations.  4. Supervision of high risk pregnancy in third trimester Preterm labor symptoms and general obstetric precautions including but not limited to vaginal bleeding, contractions, leaking of fluid and fetal movement were reviewed in detail with the patient. Please refer to After Visit Summary for other counseling recommendations.  Return in about 2 weeks (around 07/13/2017) for OB Visit.   Jaynie Collins, MD

## 2017-06-29 NOTE — Patient Instructions (Signed)
Return to clinic for any scheduled appointments or obstetric concerns, or go to [facility] for evaluation  

## 2017-07-03 ENCOUNTER — Ambulatory Visit (HOSPITAL_COMMUNITY)
Admission: RE | Admit: 2017-07-03 | Discharge: 2017-07-03 | Disposition: A | Discharge status: Home / Self Care | Payer: Medicaid Other | Source: Ambulatory Visit | Attending: Family Medicine | Admitting: Family Medicine

## 2017-07-03 ENCOUNTER — Encounter (HOSPITAL_COMMUNITY): Payer: Self-pay

## 2017-07-03 DIAGNOSIS — O30033 Twin pregnancy, monochorionic/diamniotic, third trimester: Secondary | ICD-10-CM | POA: Diagnosis present

## 2017-07-03 DIAGNOSIS — O30039 Twin pregnancy, monochorionic/diamniotic, unspecified trimester: Secondary | ICD-10-CM

## 2017-07-03 DIAGNOSIS — Z3A32 32 weeks gestation of pregnancy: Secondary | ICD-10-CM | POA: Insufficient documentation

## 2017-07-03 DIAGNOSIS — O365931 Maternal care for other known or suspected poor fetal growth, third trimester, fetus 1: Secondary | ICD-10-CM | POA: Insufficient documentation

## 2017-07-03 DIAGNOSIS — O321XX1 Maternal care for breech presentation, fetus 1: Secondary | ICD-10-CM | POA: Insufficient documentation

## 2017-07-10 ENCOUNTER — Encounter (HOSPITAL_COMMUNITY): Payer: Self-pay

## 2017-07-10 ENCOUNTER — Other Ambulatory Visit (HOSPITAL_COMMUNITY): Payer: Self-pay | Admitting: Obstetrics and Gynecology

## 2017-07-10 ENCOUNTER — Ambulatory Visit (HOSPITAL_COMMUNITY)
Admission: RE | Admit: 2017-07-10 | Discharge: 2017-07-10 | Disposition: A | Discharge status: Home / Self Care | Payer: Medicaid Other | Source: Ambulatory Visit | Attending: Family Medicine | Admitting: Family Medicine

## 2017-07-10 DIAGNOSIS — O30039 Twin pregnancy, monochorionic/diamniotic, unspecified trimester: Secondary | ICD-10-CM

## 2017-07-10 DIAGNOSIS — O365931 Maternal care for other known or suspected poor fetal growth, third trimester, fetus 1: Secondary | ICD-10-CM

## 2017-07-10 DIAGNOSIS — Z3A33 33 weeks gestation of pregnancy: Secondary | ICD-10-CM | POA: Insufficient documentation

## 2017-07-10 DIAGNOSIS — O365930 Maternal care for other known or suspected poor fetal growth, third trimester, not applicable or unspecified: Secondary | ICD-10-CM | POA: Diagnosis not present

## 2017-07-10 DIAGNOSIS — O09893 Supervision of other high risk pregnancies, third trimester: Secondary | ICD-10-CM

## 2017-07-10 DIAGNOSIS — O30033 Twin pregnancy, monochorionic/diamniotic, third trimester: Secondary | ICD-10-CM | POA: Insufficient documentation

## 2017-07-11 ENCOUNTER — Encounter
Admission: EM | Disposition: A | Discharge status: Home / Self Care | Payer: Self-pay | Source: Home / Self Care | Attending: Obstetrics & Gynecology

## 2017-07-11 ENCOUNTER — Inpatient Hospital Stay: Payer: Medicaid Other | Admitting: Anesthesiology

## 2017-07-11 ENCOUNTER — Inpatient Hospital Stay
Admission: EM | Admit: 2017-07-11 | Discharge: 2017-07-14 | DRG: 783 | Disposition: A | Discharge status: Home / Self Care | Payer: Medicaid Other | Attending: Obstetrics & Gynecology | Admitting: Obstetrics & Gynecology

## 2017-07-11 DIAGNOSIS — O9081 Anemia of the puerperium: Secondary | ICD-10-CM | POA: Diagnosis not present

## 2017-07-11 DIAGNOSIS — Z3A33 33 weeks gestation of pregnancy: Secondary | ICD-10-CM

## 2017-07-11 DIAGNOSIS — O98812 Other maternal infectious and parasitic diseases complicating pregnancy, second trimester: Secondary | ICD-10-CM

## 2017-07-11 DIAGNOSIS — Z302 Encounter for sterilization: Secondary | ICD-10-CM

## 2017-07-11 DIAGNOSIS — O30033 Twin pregnancy, monochorionic/diamniotic, third trimester: Secondary | ICD-10-CM | POA: Diagnosis present

## 2017-07-11 DIAGNOSIS — O26893 Other specified pregnancy related conditions, third trimester: Secondary | ICD-10-CM | POA: Diagnosis present

## 2017-07-11 DIAGNOSIS — O09899 Supervision of other high risk pregnancies, unspecified trimester: Secondary | ICD-10-CM

## 2017-07-11 DIAGNOSIS — O4693 Antepartum hemorrhage, unspecified, third trimester: Secondary | ICD-10-CM | POA: Diagnosis present

## 2017-07-11 DIAGNOSIS — A749 Chlamydial infection, unspecified: Secondary | ICD-10-CM

## 2017-07-11 DIAGNOSIS — O365931 Maternal care for other known or suspected poor fetal growth, third trimester, fetus 1: Principal | ICD-10-CM | POA: Diagnosis present

## 2017-07-11 DIAGNOSIS — D62 Acute posthemorrhagic anemia: Secondary | ICD-10-CM | POA: Diagnosis not present

## 2017-07-11 DIAGNOSIS — R030 Elevated blood-pressure reading, without diagnosis of hypertension: Secondary | ICD-10-CM | POA: Diagnosis present

## 2017-07-11 DIAGNOSIS — O0993 Supervision of high risk pregnancy, unspecified, third trimester: Secondary | ICD-10-CM

## 2017-07-11 DIAGNOSIS — O321XX1 Maternal care for breech presentation, fetus 1: Secondary | ICD-10-CM | POA: Diagnosis present

## 2017-07-11 DIAGNOSIS — O30039 Twin pregnancy, monochorionic/diamniotic, unspecified trimester: Secondary | ICD-10-CM | POA: Diagnosis present

## 2017-07-11 DIAGNOSIS — Z87891 Personal history of nicotine dependence: Secondary | ICD-10-CM

## 2017-07-11 DIAGNOSIS — O403XX2 Polyhydramnios, third trimester, fetus 2: Secondary | ICD-10-CM | POA: Diagnosis present

## 2017-07-11 DIAGNOSIS — O163 Unspecified maternal hypertension, third trimester: Secondary | ICD-10-CM

## 2017-07-11 DIAGNOSIS — N1330 Unspecified hydronephrosis: Secondary | ICD-10-CM

## 2017-07-11 HISTORY — PX: TUBAL LIGATION: SHX77

## 2017-07-11 LAB — PROTEIN / CREATININE RATIO, URINE
Creatinine, Urine: 68 mg/dL
Protein Creatinine Ratio: 0.22 mg/mg{Cre} — ABNORMAL HIGH (ref 0.00–0.15)
Total Protein, Urine: 15 mg/dL

## 2017-07-11 LAB — CBC WITH DIFFERENTIAL/PLATELET
BASOS ABS: 0 10*3/uL (ref 0–0.1)
Basophils Relative: 0 %
Eosinophils Absolute: 0 10*3/uL (ref 0–0.7)
Eosinophils Relative: 0 %
HEMATOCRIT: 32.3 % — AB (ref 35.0–47.0)
Hemoglobin: 11.1 g/dL — ABNORMAL LOW (ref 12.0–16.0)
LYMPHS ABS: 2.7 10*3/uL (ref 1.0–3.6)
LYMPHS PCT: 22 %
MCH: 30.2 pg (ref 26.0–34.0)
MCHC: 34.4 g/dL (ref 32.0–36.0)
MCV: 87.7 fL (ref 80.0–100.0)
MONO ABS: 0.8 10*3/uL (ref 0.2–0.9)
MONOS PCT: 6 %
NEUTROS ABS: 8.8 10*3/uL — AB (ref 1.4–6.5)
Neutrophils Relative %: 72 %
Platelets: 213 10*3/uL (ref 150–440)
RBC: 3.68 MIL/uL — ABNORMAL LOW (ref 3.80–5.20)
RDW: 13.9 % (ref 11.5–14.5)
WBC: 12.4 10*3/uL — ABNORMAL HIGH (ref 3.6–11.0)

## 2017-07-11 LAB — COMPREHENSIVE METABOLIC PANEL
ALT: 10 U/L — ABNORMAL LOW (ref 14–54)
AST: 20 U/L (ref 15–41)
Albumin: 2.9 g/dL — ABNORMAL LOW (ref 3.5–5.0)
Alkaline Phosphatase: 167 U/L — ABNORMAL HIGH (ref 38–126)
Anion gap: 9 (ref 5–15)
BILIRUBIN TOTAL: 0.4 mg/dL (ref 0.3–1.2)
BUN: 5 mg/dL — AB (ref 6–20)
CALCIUM: 8.6 mg/dL — AB (ref 8.9–10.3)
CO2: 22 mmol/L (ref 22–32)
CREATININE: 0.66 mg/dL (ref 0.44–1.00)
Chloride: 106 mmol/L (ref 101–111)
GFR calc Af Amer: >60 mL/min (ref >60)
GLUCOSE: 101 mg/dL — AB (ref 65–99)
Potassium: 3.7 mmol/L (ref 3.5–5.1)
Sodium: 137 mmol/L (ref 135–145)
Total Protein: 6.6 g/dL (ref 6.5–8.1)

## 2017-07-11 SURGERY — Surgical Case
Anesthesia: Spinal | Site: Abdomen | Wound class: Clean Contaminated

## 2017-07-11 MED ORDER — LACTATED RINGERS IV SOLN: 500.0000 mL | INTRAVENOUS | Status: DC | PRN

## 2017-07-11 MED ORDER — NALBUPHINE HCL 10 MG/ML IJ SOLN: 5.0000 mg | INTRAMUSCULAR | Status: DC | PRN

## 2017-07-11 MED ORDER — DEXAMETHASONE SODIUM PHOSPHATE 10 MG/ML IJ SOLN
INTRAMUSCULAR | Status: DC | PRN
  Administered 2017-07-11: 10 mg via INTRAVENOUS

## 2017-07-11 MED ORDER — ZOLPIDEM TARTRATE 5 MG PO TABS: 5.0000 mg | ORAL_TABLET | Freq: Every evening | ORAL | Status: DC | PRN

## 2017-07-11 MED ORDER — COCONUT OIL OIL: 1.0000 "application " | TOPICAL_OIL | Status: DC | PRN

## 2017-07-11 MED ORDER — SIMETHICONE 80 MG PO CHEW
80.0000 mg | CHEWABLE_TABLET | Freq: Three times a day (TID) | ORAL | Status: DC
  Administered 2017-07-11 – 2017-07-14 (×10): 80 mg via ORAL
  Filled 2017-07-11 (×10): qty 1

## 2017-07-11 MED ORDER — TERBUTALINE SULFATE 1 MG/ML IJ SOLN
0.2500 mg | Freq: Once | INTRAMUSCULAR | Status: AC
  Administered 2017-07-11: 0.25 mg via SUBCUTANEOUS

## 2017-07-11 MED ORDER — PROPOFOL 10 MG/ML IV BOLUS
INTRAVENOUS | Status: AC
  Filled 2017-07-11: qty 20

## 2017-07-11 MED ORDER — PRENATAL MULTIVITAMIN CH
1.0000 | ORAL_TABLET | Freq: Every day | ORAL | Status: DC
  Administered 2017-07-11 – 2017-07-14 (×4): 1 via ORAL
  Filled 2017-07-11 (×4): qty 1

## 2017-07-11 MED ORDER — MORPHINE SULFATE (PF) 0.5 MG/ML IJ SOLN
INTRAMUSCULAR | Status: AC
  Filled 2017-07-11: qty 10

## 2017-07-11 MED ORDER — ONDANSETRON HCL 4 MG/2ML IJ SOLN
INTRAMUSCULAR | Status: DC | PRN
  Administered 2017-07-11: 4 mg via INTRAVENOUS

## 2017-07-11 MED ORDER — BUPIVACAINE HCL (PF) 0.5 % IJ SOLN
INTRAMUSCULAR | Status: AC
  Filled 2017-07-11: qty 10

## 2017-07-11 MED ORDER — OXYCODONE-ACETAMINOPHEN 5-325 MG PO TABS
1.0000 | ORAL_TABLET | ORAL | Status: DC | PRN
  Administered 2017-07-12 – 2017-07-13 (×4): 1 via ORAL
  Filled 2017-07-11 (×6): qty 1

## 2017-07-11 MED ORDER — MENTHOL 3 MG MT LOZG: 1.0000 | LOZENGE | OROMUCOSAL | Status: DC | PRN

## 2017-07-11 MED ORDER — PHENYLEPHRINE HCL 10 MG/ML IJ SOLN
INTRAMUSCULAR | Status: DC | PRN
  Administered 2017-07-11: 200 ug via INTRAVENOUS
  Administered 2017-07-11 (×6): 100 ug via INTRAVENOUS

## 2017-07-11 MED ORDER — LACTATED RINGERS IV SOLN
INTRAVENOUS | Status: DC
  Administered 2017-07-11: 04:00:00 via INTRAVENOUS

## 2017-07-11 MED ORDER — OXYTOCIN 40 UNITS IN LACTATED RINGERS INFUSION - SIMPLE MED
2.5000 [IU]/h | INTRAVENOUS | Status: AC
  Administered 2017-07-11: 2.5 [IU]/h via INTRAVENOUS
  Filled 2017-07-11: qty 1000

## 2017-07-11 MED ORDER — LACTATED RINGERS IV SOLN: INTRAVENOUS | Status: DC

## 2017-07-11 MED ORDER — MEPERIDINE HCL 25 MG/ML IJ SOLN: 6.2500 mg | INTRAMUSCULAR | Status: DC | PRN

## 2017-07-11 MED ORDER — ACETAMINOPHEN 500 MG PO TABS
1000.0000 mg | ORAL_TABLET | Freq: Four times a day (QID) | ORAL | Status: AC
  Administered 2017-07-11 – 2017-07-12 (×4): 1000 mg via ORAL
  Filled 2017-07-11 (×4): qty 2

## 2017-07-11 MED ORDER — DIPHENHYDRAMINE HCL 50 MG/ML IJ SOLN: 12.5000 mg | INTRAMUSCULAR | Status: DC | PRN

## 2017-07-11 MED ORDER — LACTATED RINGERS IV SOLN
INTRAVENOUS | Status: DC
  Administered 2017-07-11: 07:00:00 via INTRAVENOUS

## 2017-07-11 MED ORDER — SENNOSIDES-DOCUSATE SODIUM 8.6-50 MG PO TABS
2.0000 | ORAL_TABLET | ORAL | Status: DC
  Administered 2017-07-12 – 2017-07-13 (×3): 2 via ORAL
  Filled 2017-07-11 (×3): qty 2

## 2017-07-11 MED ORDER — ONDANSETRON HCL 4 MG/2ML IJ SOLN
4.0000 mg | Freq: Three times a day (TID) | INTRAMUSCULAR | Status: DC | PRN
  Administered 2017-07-11: 4 mg via INTRAVENOUS
  Filled 2017-07-11: qty 2

## 2017-07-11 MED ORDER — OXYCODONE-ACETAMINOPHEN 5-325 MG PO TABS
2.0000 | ORAL_TABLET | ORAL | Status: DC | PRN
  Administered 2017-07-13 (×2): 2 via ORAL
  Filled 2017-07-11: qty 2

## 2017-07-11 MED ORDER — DIPHENHYDRAMINE HCL 25 MG PO CAPS: 25.0000 mg | ORAL_CAPSULE | Freq: Four times a day (QID) | ORAL | Status: DC | PRN

## 2017-07-11 MED ORDER — IBUPROFEN 600 MG PO TABS
600.0000 mg | ORAL_TABLET | Freq: Four times a day (QID) | ORAL | Status: DC | PRN
  Administered 2017-07-12 – 2017-07-14 (×6): 600 mg via ORAL
  Filled 2017-07-11: qty 1
  Filled 2017-07-11 (×2): qty 3
  Filled 2017-07-11 (×2): qty 1
  Filled 2017-07-11 (×2): qty 3
  Filled 2017-07-11 (×3): qty 1
  Filled 2017-07-11 (×2): qty 3
  Filled 2017-07-11: qty 1

## 2017-07-11 MED ORDER — DIPHENHYDRAMINE HCL 25 MG PO CAPS: 25.0000 mg | ORAL_CAPSULE | ORAL | Status: DC | PRN

## 2017-07-11 MED ORDER — NALOXONE HCL 0.4 MG/ML IJ SOLN: 0.4000 mg | INTRAMUSCULAR | Status: DC | PRN

## 2017-07-11 MED ORDER — OXYTOCIN 40 UNITS IN LACTATED RINGERS INFUSION - SIMPLE MED
INTRAVENOUS | Status: DC | PRN
  Administered 2017-07-11: 800 mL via INTRAVENOUS

## 2017-07-11 MED ORDER — CLINDAMYCIN PHOSPHATE 900 MG/50ML IV SOLN
900.0000 mg | Freq: Once | INTRAVENOUS | Status: AC
  Administered 2017-07-11: 900 mg via INTRAVENOUS

## 2017-07-11 MED ORDER — MORPHINE SULFATE-NACL 0.5-0.9 MG/ML-% IV SOSY
PREFILLED_SYRINGE | INTRAVENOUS | Status: DC | PRN
  Administered 2017-07-11: .1 mg via INTRATHECAL

## 2017-07-11 MED ORDER — KETOROLAC TROMETHAMINE 30 MG/ML IJ SOLN: 30.0000 mg | Freq: Four times a day (QID) | INTRAMUSCULAR | Status: AC | PRN

## 2017-07-11 MED ORDER — WITCH HAZEL-GLYCERIN EX PADS: 1.0000 "application " | MEDICATED_PAD | CUTANEOUS | Status: DC | PRN

## 2017-07-11 MED ORDER — ONDANSETRON HCL 4 MG/2ML IJ SOLN: 4.0000 mg | Freq: Four times a day (QID) | INTRAMUSCULAR | Status: DC | PRN

## 2017-07-11 MED ORDER — SIMETHICONE 80 MG PO CHEW
80.0000 mg | CHEWABLE_TABLET | ORAL | Status: DC
  Administered 2017-07-12 – 2017-07-13 (×3): 80 mg via ORAL
  Filled 2017-07-11 (×3): qty 1

## 2017-07-11 MED ORDER — BUPIVACAINE HCL 0.5 % IJ SOLN
10.0000 mL | Freq: Once | INTRAMUSCULAR | Status: DC
  Filled 2017-07-11: qty 10

## 2017-07-11 MED ORDER — NALBUPHINE HCL 10 MG/ML IJ SOLN: 5.0000 mg | Freq: Once | INTRAMUSCULAR | Status: DC | PRN

## 2017-07-11 MED ORDER — SOD CITRATE-CITRIC ACID 500-334 MG/5ML PO SOLN
ORAL | Status: AC
  Administered 2017-07-11: 30 mL via ORAL
  Filled 2017-07-11: qty 15

## 2017-07-11 MED ORDER — SODIUM CHLORIDE 0.9% FLUSH: 3.0000 mL | INTRAVENOUS | Status: DC | PRN

## 2017-07-11 MED ORDER — BUPIVACAINE 0.25 % ON-Q PUMP DUAL CATH 400 ML
400.0000 mL | INJECTION | Status: DC
  Filled 2017-07-11: qty 400

## 2017-07-11 MED ORDER — DIBUCAINE 1 % RE OINT: 1.0000 "application " | TOPICAL_OINTMENT | RECTAL | Status: DC | PRN

## 2017-07-11 MED ORDER — BUPIVACAINE HCL (PF) 0.5 % IJ SOLN
INTRAMUSCULAR | Status: DC | PRN
  Administered 2017-07-11: 10 mL

## 2017-07-11 MED ORDER — ACETAMINOPHEN 325 MG PO TABS: 650.0000 mg | ORAL_TABLET | ORAL | Status: DC | PRN

## 2017-07-11 MED ORDER — BUPIVACAINE IN DEXTROSE 0.75-8.25 % IT SOLN: INTRATHECAL | Status: DC | PRN

## 2017-07-11 MED ORDER — MORPHINE SULFATE (PF) 2 MG/ML IV SOLN: 1.0000 mg | INTRAVENOUS | Status: DC | PRN

## 2017-07-11 MED ORDER — EPHEDRINE SULFATE 50 MG/ML IJ SOLN
INTRAMUSCULAR | Status: DC | PRN
  Administered 2017-07-11 (×2): 10 mg via INTRAVENOUS

## 2017-07-11 MED ORDER — TETANUS-DIPHTH-ACELL PERTUSSIS 5-2.5-18.5 LF-MCG/0.5 IM SUSP
0.5000 mL | Freq: Once | INTRAMUSCULAR | Status: DC
  Filled 2017-07-11: qty 0.5

## 2017-07-11 MED ORDER — SIMETHICONE 80 MG PO CHEW: 80.0000 mg | CHEWABLE_TABLET | ORAL | Status: DC | PRN

## 2017-07-11 MED ORDER — KETOROLAC TROMETHAMINE 30 MG/ML IJ SOLN
30.0000 mg | Freq: Four times a day (QID) | INTRAMUSCULAR | Status: AC
  Administered 2017-07-11 – 2017-07-12 (×4): 30 mg via INTRAVENOUS
  Filled 2017-07-11 (×4): qty 1

## 2017-07-11 MED ORDER — DEXTROSE 5 % IV SOLN
1.0000 ug/kg/h | INTRAVENOUS | Status: DC | PRN
  Filled 2017-07-11: qty 2

## 2017-07-11 MED ORDER — TERBUTALINE SULFATE 1 MG/ML IJ SOLN
INTRAMUSCULAR | Status: AC
Start: 2017-07-11 — End: 2017-07-11
  Administered 2017-07-11: 0.25 mg via SUBCUTANEOUS
  Filled 2017-07-11: qty 1

## 2017-07-11 MED ORDER — OXYTOCIN BOLUS FROM INFUSION: 500.0000 mL | Freq: Once | INTRAVENOUS | Status: DC

## 2017-07-11 MED ORDER — CLINDAMYCIN PHOSPHATE 900 MG/50ML IV SOLN
INTRAVENOUS | Status: AC
  Filled 2017-07-11: qty 50

## 2017-07-11 MED ORDER — OXYTOCIN 40 UNITS IN LACTATED RINGERS INFUSION - SIMPLE MED
INTRAVENOUS | Status: AC
  Filled 2017-07-11: qty 1000

## 2017-07-11 MED ORDER — LACTATED RINGERS IV SOLN
INTRAVENOUS | Status: DC
  Administered 2017-07-11: 23:00:00 via INTRAVENOUS

## 2017-07-11 MED ORDER — OXYTOCIN 40 UNITS IN LACTATED RINGERS INFUSION - SIMPLE MED: 2.5000 [IU]/h | INTRAVENOUS | Status: DC

## 2017-07-11 MED ORDER — LIDOCAINE HCL (PF) 1 % IJ SOLN: 30.0000 mL | INTRAMUSCULAR | Status: DC | PRN

## 2017-07-11 MED ORDER — SOD CITRATE-CITRIC ACID 500-334 MG/5ML PO SOLN
30.0000 mL | ORAL | Status: AC
  Administered 2017-07-11: 30 mL via ORAL

## 2017-07-11 MED ORDER — BUPIVACAINE HCL (PF) 0.5 % IJ SOLN
INTRAMUSCULAR | Status: AC
  Filled 2017-07-11: qty 30

## 2017-07-11 SURGICAL SUPPLY — 23 items
CANISTER SUCT 3000ML PPV (MISCELLANEOUS) ×4 IMPLANT
CATH KIT ON-Q SILVERSOAK 5IN (CATHETERS) ×8 IMPLANT
CHLORAPREP W/TINT 26ML (MISCELLANEOUS) ×8 IMPLANT
DERMABOND ADVANCED (GAUZE/BANDAGES/DRESSINGS) ×2
DERMABOND ADVANCED .7 DNX12 (GAUZE/BANDAGES/DRESSINGS) ×2 IMPLANT
DRSG OPSITE POSTOP 4X10 (GAUZE/BANDAGES/DRESSINGS) ×8 IMPLANT
ELECT CAUTERY BLADE 6.4 (BLADE) ×4 IMPLANT
ELECT REM PT RETURN 9FT ADLT (ELECTROSURGICAL) ×4
ELECTRODE REM PT RTRN 9FT ADLT (ELECTROSURGICAL) ×2 IMPLANT
GLOVE SKINSENSE NS SZ8.0 LF (GLOVE) ×2
GLOVE SKINSENSE STRL SZ8.0 LF (GLOVE) ×2 IMPLANT
GOWN STRL REUS W/ TWL LRG LVL3 (GOWN DISPOSABLE) ×2 IMPLANT
GOWN STRL REUS W/ TWL XL LVL3 (GOWN DISPOSABLE) ×4 IMPLANT
GOWN STRL REUS W/TWL LRG LVL3 (GOWN DISPOSABLE) ×2
GOWN STRL REUS W/TWL XL LVL3 (GOWN DISPOSABLE) ×4
NS IRRIG 1000ML POUR BTL (IV SOLUTION) ×4 IMPLANT
PACK C SECTION AR (MISCELLANEOUS) ×4 IMPLANT
PAD OB MATERNITY 4.3X12.25 (PERSONAL CARE ITEMS) ×4 IMPLANT
PAD PREP 24X41 OB/GYN DISP (PERSONAL CARE ITEMS) ×4 IMPLANT
SUT MAXON ABS #0 GS21 30IN (SUTURE) ×8 IMPLANT
SUT VIC AB 1 CT1 36 (SUTURE) ×16 IMPLANT
SUT VIC AB 2-0 CT1 36 (SUTURE) ×4 IMPLANT
SUT VIC AB 4-0 FS2 27 (SUTURE) ×4 IMPLANT

## 2017-07-11 NOTE — OB Triage Note (Signed)
Patient receives care from [facility]. Patient is c/o of contractions that started around 1:30am, right after having sexual intercourse. Contractions are felt in the patients lower abdomen, ratinf pain 10 out of 10. Patient also c/o of blood in her urine. Reports bloody discharge, enough to fill pad initially per patient.

## 2017-07-11 NOTE — Progress Notes (Signed)
Labor Progress Note   26 y.o. [id number] @ [redacted]w[redacted]d , admitted for Teins at 33 weeks  Subjective:  Mild pains.  Objective:  BP 139/90   Pulse (!) 116   Temp 98.6 F (37 C) (Oral)   Resp 18   Wt 167 lb (75.8 kg)   LMP 11/21/2016   SpO2 99%   BMI 29.58 kg/m  Abd: mild Extr: trace to 1+ bilateral pedal edema SVE: CERVIX: 6-7 cm dilated, 90 effaced, -2 station  EFM: FHR: 150 x2 bpm, variability: moderate,  accelerations:  Present,  decelerations:  Absent Toco: Frequency: Every 5 minutes Labs: I have reviewed the patient's lab results.  Results for orders placed or performed during the hospital encounter of 07/11/17  CBC with Differential/Platelet  Result Value Ref Range   WBC 12.4 (H) 3.6 - 11.0 K/uL   RBC 3.68 (L) 3.80 - 5.20 MIL/uL   Hemoglobin 11.1 (L) 12.0 - 16.0 g/dL   HCT 13.0 (L) 86.5 - 78.4 %   MCV 87.7 80.0 - 100.0 fL   MCH 30.2 26.0 - 34.0 pg   MCHC 34.4 32.0 - 36.0 g/dL   RDW 69.6 29.5 - 28.4 %   Platelets 213 150 - 440 K/uL   Neutrophils Relative % 72 %   Neutro Abs 8.8 (H) 1.4 - 6.5 K/uL   Lymphocytes Relative 22 %   Lymphs Abs 2.7 1.0 - 3.6 K/uL   Monocytes Relative 6 %   Monocytes Absolute 0.8 0.2 - 0.9 K/uL   Eosinophils Relative 0 %   Eosinophils Absolute 0.0 0 - 0.7 K/uL   Basophils Relative 0 %   Basophils Absolute 0.0 0 - 0.1 K/uL  Comprehensive metabolic panel  Result Value Ref Range   Sodium 137 135 - 145 mmol/L   Potassium 3.7 3.5 - 5.1 mmol/L   Chloride 106 101 - 111 mmol/L   CO2 22 22 - 32 mmol/L   Glucose, Bld 101 (H) 65 - 99 mg/dL   BUN 5 (L) 6 - 20 mg/dL   Creatinine, Ser 1.32 0.44 - 1.00 mg/dL   Calcium 8.6 (L) 8.9 - 10.3 mg/dL   Total Protein 6.6 6.5 - 8.1 g/dL   Albumin 2.9 (L) 3.5 - 5.0 g/dL   AST 20 15 - 41 U/L   ALT 10 (L) 14 - 54 U/L   Alkaline Phosphatase 167 (H) 38 - 126 U/L   Total Bilirubin 0.4 0.3 - 1.2 mg/dL   GFR calc non Af Amer >60 >60 mL/min   GFR calc Af Amer >60 >60 mL/min   Anion gap 9 5 - 15   Assessment &  Plan:  G4W1027 @ [redacted]w[redacted]d, admitted for  Pregnancy and Labor/Delivery Management  1. Pain management: none. 2. FWB: FHT category 1.  3. ID: GBS not done 4. Labor management: Progressing, so since breech will proceed with CS as well as BTL  Patient has been counseled as to the risks of prematurity, including risks of respiratory depression or distress, jaundice, feeding or temperature regulation problems, neurologic concerns including hearing or visual problems, and brain complications.    The risks of cesarean section discussed with the patient included but were not limited to: bleeding which may require transfusion or reoperation; infection which may require antibiotics; injury to bowel, bladder, ureters or other surrounding organs; injury to the fetus; need for additional procedures including hysterectomy in the event of a life-threatening hemorrhage; placental abnormalities wth subsequent pregnancies, incisional problems, thromboembolic phenomenon and other postoperative/anesthesia complications. The patient concurred  with the proposed plan, giving informed written consent for the procedure.   The patient has been fully informed about all methods of contraception, both temporary and permanent. She understands that tubal ligation is meant to be permanent, absolute and irreversible. She was told that there is an approximately 1 in 400 chance of a pregnancy in the future after tubal ligation. She was told the short and long term complications of tubal ligation. She understands the risks from this surgery include, but are not limited to, the risks of anesthesia, hemorrhage, infection, perforation, and injury to adjacent structures, bowel, bladder and blood vessels.   All discussed with patient, see orders  Annamarie Major, MD, [facility], [facility] 07/11/2017  6:11 AM

## 2017-07-11 NOTE — Transfer of Care (Signed)
Immediate Anesthesia Transfer of Care Note  Patient: Sharon Chandler  Procedure(s) Performed: CESAREAN SECTION (N/A Abdomen) BILATERAL TUBAL LIGATION (Bilateral )  Patient Location: PACU  Anesthesia Type:Spinal  Level of Consciousness: awake, alert  and oriented  Airway & Oxygen Therapy: Patient Spontanous Breathing  Post-op Assessment: Report given to RN and Post -op Vital signs reviewed and stable  Post vital signs: Reviewed  Last Vitals:  Vitals:   07/11/17 0451 07/11/17 0455  BP: 139/90   Pulse: (!) 116   Resp:    Temp:    SpO2:  99%    Last Pain:  Vitals:   07/11/17 0307  TempSrc: Oral  PainSc:          Complications: No apparent anesthesia complications

## 2017-07-11 NOTE — Op Note (Signed)
Cesarean Section Procedure Note Indications: malpresentation: Breech first twin and preterm labor at 33 weeks, Desire for permanent sterility  Pre-operative Diagnosis: Intrauterine pregnancy [redacted]w[redacted]d ;  malpresentation: Breech first twin  and preterm labor at 12 weeks, Desire for permanent sterility Post-operative Diagnosis: same, delivered. Procedure: Low Transverse Cesarean Section, Bilateral Tubal Ligation Surgeon: Annamarie Major, MD Assistant(s): Tresea Mall, CNM Anesthesia: Spinal anesthesia Estimated Blood Loss:500 Complications: None; patient tolerated the procedure well. Disposition: PACU - hemodynamically stable. Condition: stable  Findings: TWIN A: A female infant in the breech presentation. Amniotic fluid - Clear  Birth weight 3-14 lbs.  Apgars of 9 and 9.  TWIN B: A female infant in the vertex presentation. Amniotic fluid - Clear  Birth weight 3-15 lbs.  Apgars of 8 and 9.  Intact placenta with a three-vessel cord. Grossly normal uterus, tubes and ovaries bilaterally. no intraabdominal adhesions were noted.  Procedure Details   The patient was taken to Operating Room, identified as the correct patient and the procedure verified as C-Section Delivery. A Time Out was held and the above information confirmed. After induction of anesthesia, the patient was draped and prepped in the usual sterile manner. A Pfannenstiel incision was made and carried down through the subcutaneous tissue to the fascia. Fascial incision was made and extended transversely with the Mayo scissors. The fascia was separated from the underlying rectus tissue superiorly and inferiorly. The peritoneum was identified and entered bluntly. Peritoneal incision was extended longitudinally. The utero-vesical peritoneal reflection was incised transversely and a bladder flap was created digitally.  A low transverse hysterotomy was made. Twin A, breech, was delivered atraumatically. The umbilical cord was clamped x2 and  cut and the infant was handed to the awaiting pediatricians.  Twin B then identified and vertex, guided down and AROM done.  Delivery without difficulty. The umbilical cord was clamped x2 and cut and the infant was handed to the awaiting pediatricians.  The placenta was removed intact and appeared normal with a 3-vessel cord.   The uterus was exteriorized and cleared of all clot and debris. The hysterotomy was closed with running sutures of 0 Vicryl suture. A second imbricating layer was placed with the same suture. Excellent hemostasis was observed.   The left Fallopian tube was identified, grasped with the Babcock clamps, and followed out distally to the fimbriae. An avascular midsection of the tube approximately 3-4cm from the cornua was grasped with the babcock clamps and brought into a knuckle at the skin incision. The tube was double ligated with 2-0 Vicryl suture and the intervening portion of tube was transected and removed. Excellent hemostasis was noted. Attention was then turned to the right fallopian tube after confirmation of identification by tracing the tube out to the fimbriae. The same procedure was then performed on the right Fallopian tube. Again, excellent hemostasis was noted at the end of the procedure.  The uterus was returned to the abdomen. The pelvis was irrigated and again, excellent hemostasis was noted.  The On Q Pain pump System was then placed.  Trocars were placed through the abdominal wall into the subfascial space and these were used to thread the silver soaker cathaters into place.The rectus fascia was then reapproximated with running sutures of Maxon, with careful placement not to incorporate the cathaters. Subcutaneous tissues are then irrigated with saline and hemostasis assured.  Skin is then closed with 4-0 vicryl suture in a subcuticular fashion followed by skin adhesive. The cathaters are flushed each with 5 mL of Bupivicaine and  stabilized into place with  dressing. Instrument, sponge, and needle counts were correct prior to the abdominal closure and at the conclusion of the case.  The patient tolerated the procedure well and was transferred to the recovery room in stable condition.   Annamarie Major, MD, [facility], [facility] 07/11/2017  7:45 AM

## 2017-07-11 NOTE — Anesthesia Post-op Follow-up Note (Signed)
Anesthesia QCDR form completed.        

## 2017-07-11 NOTE — Anesthesia Procedure Notes (Signed)
Spinal  Patient location during procedure: OB Start time: 07/11/2017 6:48 AM End time: 07/11/2017 6:54 AM Staffing Performed: anesthesiologist  Preanesthetic Checklist Completed: patient identified, site marked, surgical consent, pre-op evaluation, timeout performed, IV checked, risks and benefits discussed and monitors and equipment checked Spinal Block Patient position: sitting Prep: Betadine Patient monitoring: heart rate, cardiac monitor, continuous pulse ox and blood pressure Approach: midline Location: L3-4 Injection technique: single-shot Needle Needle type: Pencan  Needle gauge: 24 G Needle length: 9 cm Needle insertion depth: 6.5 cm
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
## 2017-07-11 NOTE — Discharge Summary (Signed)
OB Discharge Summary     Patient Name: Sharon Chandler DOB: [birth year] MRN: [id number]  Date of admission: 07/11/2017 Delivering MD: Velora Mediate, MD  Date of Delivery: 07/11/2017  Date of discharge: 07/14/2017  Admitting diagnosis: Contractions twins Intrauterine pregnancy: [redacted]w[redacted]d     Secondary diagnosis: IUGR, Polyhydramnios, Elevated Blood Pressure, Hydronephrosis, MoDi twins, Short interval between pregnancies, History of postpartum depression     Discharge diagnosis: Preterm Pregnancy Delivered and Twins, Postpartum Cesarean Delivery                         Hospital course:  Onset of Labor With Unplanned C/S  26 y.o. yo [id number] at [redacted]w[redacted]d was admitted in Active Labor on 07/11/2017. Patient had a labor course significant for cervical dilation and noted twins with breech presentation of first twin.   The patient went for cesarean section due to [facility], and delivered a Viable infant,   Sharon, Chandler [[id number]]  07/11/2017   Sharon, Chandler [[id number]]  07/11/2017  Details of operation can be found in separate operative note. Patient had an uncomplicated postpartum course.  She is ambulating,tolerating a regular diet, passing flatus, and urinating well.  Patient is discharged home in stable condition 07/14/17.                                                                 Post partum procedures:none  Complications: None  Physical exam on 07/14/2017: Vitals:   07/13/17 1927 07/13/17 2335 07/14/17 0415 07/14/17 0730  BP: (!) 143/88   123/69  Pulse: (!) 101   80  Resp: 20   18  Temp: 98.5 F (36.9 C) 98.8 F (37.1 C) 97.7 F (36.5 C) 98.8 F (37.1 C)  TempSrc: Oral Oral Oral Oral  SpO2: 100%   98%  Weight:      Height:       General: alert, cooperative and no distress Lochia: appropriate Uterine Fundus: firm Incision: Healing well with no significant drainage DVT Evaluation: No evidence of DVT seen on physical exam.  Labs: Lab Results   Component Value Date   WBC 9.1 07/12/2017   HGB 9.5 (L) 07/12/2017   HCT 27.1 (L) 07/12/2017   MCV 88.5 07/12/2017   PLT 173 07/12/2017   CMP Latest Ref Rng & Units 07/11/2017  Glucose 65 - 99 mg/dL 951(O)  BUN 6 - 20 mg/dL 5(L)  Creatinine 8.41 - 1.00 mg/dL 6.60  Sodium 630 - 160 mmol/L 137  Potassium 3.5 - 5.1 mmol/L 3.7  Chloride 101 - 111 mmol/L 106  CO2 22 - 32 mmol/L 22  Calcium 8.9 - 10.3 mg/dL 1.0(X)  Total Protein 6.5 - 8.1 g/dL 6.6  Total Bilirubin 0.3 - 1.2 mg/dL 0.4  Alkaline Phos 38 - 126 U/L 167(H)  AST 15 - 41 U/L 20  ALT 14 - 54 U/L 10(L)    Discharge instruction: per After Visit Summary.  Medications:  Allergies as of 07/14/2017      Reactions   Penicillins Other (See Comments)   Unknown childhood reaction. Tolerates cephalosporins well.   Sulfa Antibiotics Rash      Medication List    STOP taking these medications   azithromycin 500 MG tablet Commonly known as:  NATFTDDUK  cyclobenzaprine 10 MG tablet Commonly known as:  FLEXERIL   nicotine 21 mg/24hr patch Commonly known as:  NICODERM CQ     TAKE these medications   CONCEPT OB 130-92.4-1 MG Caps Take 1 capsule by mouth daily.   oxyCODONE-acetaminophen 5-325 MG tablet Commonly known as:  PERCOCET/ROXICET Take 1 tablet by mouth every 6 (six) hours as needed for severe pain (pain scale > 7).            Discharge Care Instructions        Start     Ordered   07/14/17 0000  Discharge wound care:    Comments:  Keep incision dry, clean.   07/14/17 1155      Diet: routine diet  Activity: Advance as tolerated. Pelvic rest for 6 weeks.   Outpatient follow up: Follow-up Information    Nadara Mustard, MD. Schedule an appointment as soon as possible for a visit in 1 week(s).   Specialty:  Obstetrics and Gynecology Why:  follow up for incision check Contact information: [address] [place] [address] [phone]             Postpartum contraception: Tubal  Ligation Rhogam Given postpartum: NA Rubella vaccine given postpartum: Rubella Immune Varicella vaccine given postpartum: Varicella Immune TDaP given antepartum or postpartum: Given Antepartum  Newborn Data:   Sharon, Chandler [[id number]]  Live born female  Birth Weight: 3 lb 14.1 oz (1760 g) APGAR: 9, 9  Newborn Delivery   Birth date/time:  07/11/2017 07:06:00 Delivery type:  C-Section, Low Transverse  C-section categorization:  Primary      Sharon, Chandler [[id number]]  Live born female  Birth Weight: 3 lb 15.1 oz (1790 g) APGAR: 8, 9  Newborn Delivery   Birth date/time:  07/11/2017 07:10:00 Delivery type:  C-Section, Low Transverse  C-section categorization:  Primary      Baby Feeding: Bottle and Breast  Disposition:NICU  SIGNED:  Tresea Mall, CNM 07/14/2017 11:56 AM

## 2017-07-11 NOTE — Lactation Note (Signed)
This note was copied from a baby's chart. Lactation Consultation Note  Patient Name: Sharon Chandler ZOXWR'U Date: 07/11/2017    During morning rounds, I taught Mom how to hand express and to use Symphony breast pump at least 8 times per 24 hours. I showed her the free "My NICU Baby" app from [employer] to keep track of pumpings, etc.the [facility] peer counselor Sharon Chandler, made rounds today and offered to sign her up so she could get a breast pump from [facility]. Mom declined.     Maternal Data    Feeding    LATCH Score                   Interventions    Lactation Tools Discussed/Used     Consult Status      Sunday Corn 07/11/2017, 4:36 PM

## 2017-07-11 NOTE — Anesthesia Preprocedure Evaluation (Signed)
Anesthesia Evaluation  Patient identified by MRN, date of birth, ID band Patient awake    Reviewed: Allergy & Precautions, NPO status , Patient's Chart, lab work & pertinent test results  History of Anesthesia Complications Negative for: history of anesthetic complications  Airway Mallampati: II       Dental   Pulmonary neg sleep apnea, neg COPD, former smoker,           Cardiovascular (-) hypertension(-) Past MI and (-) CHF (-) dysrhythmias (-) Valvular Problems/Murmurs     Neuro/Psych neg Seizures    GI/Hepatic Neg liver ROS, GERD  ,  Endo/Other  neg diabetes  Renal/GU negative Renal ROS     Musculoskeletal   Abdominal   Peds  Hematology   Anesthesia Other Findings   Reproductive/Obstetrics                             Anesthesia Physical Anesthesia Plan  ASA: II and emergent  Anesthesia Plan: Spinal   Post-op Pain Management:    Induction:   PONV Risk Score and Plan:   Airway Management Planned:   Additional Equipment:   Intra-op Plan:   Post-operative Plan:   Informed Consent: I have reviewed the patients History and Physical, chart, labs and discussed the procedure including the risks, benefits and alternatives for the proposed anesthesia with the patient or authorized representative who has indicated his/her understanding and acceptance.     Plan Discussed with:   Anesthesia Plan Comments:         Anesthesia Quick Evaluation

## 2017-07-11 NOTE — Discharge Instructions (Signed)

## 2017-07-12 ENCOUNTER — Encounter: Payer: Self-pay | Admitting: Obstetrics & Gynecology

## 2017-07-12 LAB — CBC
HEMATOCRIT: 27.1 % — AB (ref 35.0–47.0)
Hemoglobin: 9.5 g/dL — ABNORMAL LOW (ref 12.0–16.0)
MCH: 30.9 pg (ref 26.0–34.0)
MCHC: 34.9 g/dL (ref 32.0–36.0)
MCV: 88.5 fL (ref 80.0–100.0)
Platelets: 173 10*3/uL (ref 150–440)
RBC: 3.06 MIL/uL — ABNORMAL LOW (ref 3.80–5.20)
RDW: 13.9 % (ref 11.5–14.5)
WBC: 9.1 10*3/uL (ref 3.6–11.0)

## 2017-07-12 LAB — SURGICAL PATHOLOGY

## 2017-07-12 LAB — RPR: RPR: NONREACTIVE

## 2017-07-12 NOTE — Anesthesia Post-op Follow-up Note (Signed)
  Anesthesia Pain Follow-up Note  Patient: Sharon Chandler  Day #: 1  Date of Follow-up: 07/12/2017 Time: 3:25 PM  Last Vitals:  Vitals:   07/12/17 0800 07/12/17 1053  BP: 114/66 123/80  Pulse: 98 99  Resp: 20 18  Temp: 36.7 C 36.7 C  SpO2: 99%     Level of Consciousness: alert  Pain: none   Side Effects:None  Catheter Site Exam:clean, dry, no drainage     Plan: Continue current therapy of postop epidural at surgeon's request  Rashidah [place],  Sheran Fava

## 2017-07-12 NOTE — Anesthesia Postprocedure Evaluation (Signed)
Anesthesia Post Note  Patient: Sharon Chandler  Procedure(s) Performed: CESAREAN SECTION (N/A Abdomen) BILATERAL TUBAL LIGATION (Bilateral )  Patient location during evaluation: Mother Baby Anesthesia Type: Spinal Level of consciousness: awake, awake and alert and oriented Pain management: pain level controlled Vital Signs Assessment: post-procedure vital signs reviewed and stable Respiratory status: spontaneous breathing, nonlabored ventilation and respiratory function stable Cardiovascular status: stable Postop Assessment: no headache, no backache, adequate PO intake, no apparent nausea or vomiting and patient able to bend at knees Anesthetic complications: no     Last Vitals:  Vitals:   07/12/17 0800 07/12/17 1053  BP: 114/66 123/80  Pulse: 98 99  Resp: 20 18  Temp: 36.7 C 36.7 C  SpO2: 99%     Last Pain:  Vitals:   07/12/17 1053  TempSrc: Oral  PainSc:                  Lyn Records

## 2017-07-12 NOTE — Progress Notes (Signed)
POD#1 LTCS and BTL Subjective:   Sitting up in bed, very comfortable and expresses happiness that she is recovering well. Passing flatus. Tolerating a regular diet. Has ambulated without difficulty.   Objective:   Blood pressure 114/66, pulse 98, temperature 98 F (36.7 C), temperature source Oral, resp. rate 20, height 5' 3"  (1.6 m), weight 167 lb (75.8 kg), last menstrual period 11/21/2016, SpO2 99 %, currently breastfeeding (pumping).  General: NAD Pulmonary: no increased work of breathing Abdomen: non-distended, non-tender, fundus firm at U/1, lochia rubra scant Incision: Dressing clean, dry, intact; OnQ pump in place Extremities: no edema, no erythema, no tenderness  Results for orders placed or performed during the hospital encounter of 07/11/17 (from the past 72 hour(s))  CBC with Differential/Platelet     Status: Abnormal   Collection Time: 07/11/17  4:06 AM  Result Value Ref Range   WBC 12.4 (H) 3.6 - 11.0 K/uL   RBC 3.68 (L) 3.80 - 5.20 MIL/uL   Hemoglobin 11.1 (L) 12.0 - 16.0 g/dL   HCT 32.3 (L) 35.0 - 47.0 %   MCV 87.7 80.0 - 100.0 fL   MCH 30.2 26.0 - 34.0 pg   MCHC 34.4 32.0 - 36.0 g/dL   RDW 13.9 11.5 - 14.5 %   Platelets 213 150 - 440 K/uL   Neutrophils Relative % 72 %   Neutro Abs 8.8 (H) 1.4 - 6.5 K/uL   Lymphocytes Relative 22 %   Lymphs Abs 2.7 1.0 - 3.6 K/uL   Monocytes Relative 6 %   Monocytes Absolute 0.8 0.2 - 0.9 K/uL   Eosinophils Relative 0 %   Eosinophils Absolute 0.0 0 - 0.7 K/uL   Basophils Relative 0 %   Basophils Absolute 0.0 0 - 0.1 K/uL  Comprehensive metabolic panel     Status: Abnormal   Collection Time: 07/11/17  4:06 AM  Result Value Ref Range   Sodium 137 135 - 145 mmol/L   Potassium 3.7 3.5 - 5.1 mmol/L   Chloride 106 101 - 111 mmol/L   CO2 22 22 - 32 mmol/L   Glucose, Bld 101 (H) 65 - 99 mg/dL   BUN 5 (L) 6 - 20 mg/dL   Creatinine, Ser 0.66 0.44 - 1.00 mg/dL   Calcium 8.6 (L) 8.9 - 10.3 mg/dL   Total Protein 6.6 6.5 - 8.1  g/dL   Albumin 2.9 (L) 3.5 - 5.0 g/dL   AST 20 15 - 41 U/L   ALT 10 (L) 14 - 54 U/L   Alkaline Phosphatase 167 (H) 38 - 126 U/L   Total Bilirubin 0.4 0.3 - 1.2 mg/dL   GFR calc non Af Amer >60 >60 mL/min   GFR calc Af Amer >60 >60 mL/min    Comment: (NOTE) The eGFR has been calculated using the CKD EPI equation. This calculation has not been validated in all clinical situations. eGFR's persistently <60 mL/min signify possible Chronic Kidney Disease.    Anion gap 9 5 - 15  RPR     Status: None   Collection Time: 07/11/17  4:06 AM  Result Value Ref Range   RPR Ser Ql Non Reactive Non Reactive    Comment: (NOTE) Performed At: [facility] [address] [place], [place] [id number] Lindon Romp MD VP:[id number]   Protein / creatinine ratio, urine     Status: Abnormal   Collection Time: 07/11/17  6:03 AM  Result Value Ref Range   Creatinine, Urine 68 mg/dL   Total Protein, Urine 15 mg/dL  Comment: NO NORMAL RANGE ESTABLISHED FOR THIS TEST   Protein Creatinine Ratio 0.22 (H) 0.00 - 0.15 mg/mg[Cre]  CBC     Status: Abnormal   Collection Time: 07/12/17  6:25 AM  Result Value Ref Range   WBC 9.1 3.6 - 11.0 K/uL   RBC 3.06 (L) 3.80 - 5.20 MIL/uL   Hemoglobin 9.5 (L) 12.0 - 16.0 g/dL   HCT 27.1 (L) 35.0 - 47.0 %   MCV 88.5 80.0 - 100.0 fL   MCH 30.9 26.0 - 34.0 pg   MCHC 34.9 32.0 - 36.0 g/dL   RDW 13.9 11.5 - 14.5 %   Platelets 173 150 - 440 K/uL     Assessment:   26 y.o. [id number] postoperative day # 1 recovering well and in good condition after twin delivery.   Plan:  1) Acute blood loss anemia - hemodynamically stable and asymptomatic - PO ferrous sulfate  2) Blood Type B/Positive/-- (04/17 1635) / Rubella immune / Varicella immune  3) TDAP status: given 06/04/2017   4) Breastfeeding: lactation has taught how to hand express and pump  5) Contraception: BTL  5) Disposition: Continue routine postpartum care.  Avel Sensor, CNM 07/12/2017  10:53  AM

## 2017-07-13 ENCOUNTER — Encounter: Payer: Medicaid Other | Admitting: Obstetrics & Gynecology

## 2017-07-13 MED ORDER — FERROUS FUMARATE 324 (106 FE) MG PO TABS
1.0000 | ORAL_TABLET | Freq: Every day | ORAL | Status: DC
  Administered 2017-07-13 – 2017-07-14 (×2): 106 mg via ORAL
  Filled 2017-07-13 (×2): qty 1

## 2017-07-13 NOTE — Progress Notes (Signed)
POD #2 s/p Primary CS and BTL. Mono di twins, preterm labor, malpresentation. Subjective:  Tolerating regular food, passing flatus, voiding without difficulty. Pumping breast milk. Babies are doing well. Feedings via NG tube.   Objective:  Blood pressure 117/76, pulse 83, temperature 98.5 F (36.9 C), temperature source Oral, resp. rate 18, height 5' 3"  (1.6 m), weight 75.8 kg (167 lb), last menstrual period 11/21/2016, SpO2 99 %, unknown if currently breastfeeding.  General: WF inNAD Heart: RRR without murmur Pulmonary: no increased work of breathing/ CTAB Abdomen: non-distended, BS active, soft Incision: Honeycomb dressing intact, ON Q intact Extremities: no edema, no erythema, no tenderness  Results for orders placed or performed during the hospital encounter of 07/11/17 (from the past 72 hour(s))  CBC with Differential/Platelet     Status: Abnormal   Collection Time: 07/11/17  4:06 AM  Result Value Ref Range   WBC 12.4 (H) 3.6 - 11.0 K/uL   RBC 3.68 (L) 3.80 - 5.20 MIL/uL   Hemoglobin 11.1 (L) 12.0 - 16.0 g/dL   HCT 32.3 (L) 35.0 - 47.0 %   MCV 87.7 80.0 - 100.0 fL   MCH 30.2 26.0 - 34.0 pg   MCHC 34.4 32.0 - 36.0 g/dL   RDW 13.9 11.5 - 14.5 %   Platelets 213 150 - 440 K/uL   Neutrophils Relative % 72 %   Neutro Abs 8.8 (H) 1.4 - 6.5 K/uL   Lymphocytes Relative 22 %   Lymphs Abs 2.7 1.0 - 3.6 K/uL   Monocytes Relative 6 %   Monocytes Absolute 0.8 0.2 - 0.9 K/uL   Eosinophils Relative 0 %   Eosinophils Absolute 0.0 0 - 0.7 K/uL   Basophils Relative 0 %   Basophils Absolute 0.0 0 - 0.1 K/uL  Comprehensive metabolic panel     Status: Abnormal   Collection Time: 07/11/17  4:06 AM  Result Value Ref Range   Sodium 137 135 - 145 mmol/L   Potassium 3.7 3.5 - 5.1 mmol/L   Chloride 106 101 - 111 mmol/L   CO2 22 22 - 32 mmol/L   Glucose, Bld 101 (H) 65 - 99 mg/dL   BUN 5 (L) 6 - 20 mg/dL   Creatinine, Ser 0.66 0.44 - 1.00 mg/dL   Calcium 8.6 (L) 8.9 - 10.3 mg/dL   Total  Protein 6.6 6.5 - 8.1 g/dL   Albumin 2.9 (L) 3.5 - 5.0 g/dL   AST 20 15 - 41 U/L   ALT 10 (L) 14 - 54 U/L   Alkaline Phosphatase 167 (H) 38 - 126 U/L   Total Bilirubin 0.4 0.3 - 1.2 mg/dL   GFR calc non Af Amer >60 >60 mL/min   GFR calc Af Amer >60 >60 mL/min    Comment: (NOTE) The eGFR has been calculated using the CKD EPI equation. This calculation has not been validated in all clinical situations. eGFR's persistently <60 mL/min signify possible Chronic Kidney Disease.    Anion gap 9 5 - 15  RPR     Status: None   Collection Time: 07/11/17  4:06 AM  Result Value Ref Range   RPR Ser Ql Non Reactive Non Reactive    Comment: (NOTE) Performed At: [facility] [address] [place], [place] [id number] Lindon Romp MD NW:[id number]   Protein / creatinine ratio, urine     Status: Abnormal   Collection Time: 07/11/17  6:03 AM  Result Value Ref Range   Creatinine, Urine 68 mg/dL   Total Protein, Urine  15 mg/dL    Comment: NO NORMAL RANGE ESTABLISHED FOR THIS TEST   Protein Creatinine Ratio 0.22 (H) 0.00 - 0.15 mg/mg[Cre]  Surgical pathology     Status: None   Collection Time: 07/11/17  7:29 AM  Result Value Ref Range   SURGICAL PATHOLOGY      Surgical Pathology CASE: [id number] PATIENT: Nakeisha Lozito Surgical Pathology Report     SPECIMEN SUBMITTED: A. Tube portion, right B. Tube portion, left C. Placenta, twin  CLINICAL HISTORY: None provided  PRE-OPERATIVE DIAGNOSIS: Twins breech  POST-OPERATIVE DIAGNOSIS: Same as pre-op     DIAGNOSIS: A. RIGHT FALLOPIAN TUBE PORTION; STERILIZATION: - FALLOPIAN TUBE SEGMENT WITH FULL CROSS SECTION OF THE LUMEN.  B. LEFT FALLOPIAN TUBE PORTION; STERILIZATION: - FALLOPIAN TUBE SEGMENT WITH FULL CROSS SECTION OF THE LUMEN.  C. TWIN PLACENTA; CESAREAN SECTION: - THIRD TRIMESTER DIAMNIOTIC/ MONOCHORIONIC PLACENTA, 886 GRAMS (90TH PERCENTILE). - PLACENTA A AND B: THREE-VESSEL UMBILICAL CORD WITH  ECCENTRIC INSERTION, NEGATIVE FOR VILLITIS, FUNISITIS, AND CHORIOAMNIONITIS. - FOCAL INFARCT OF THE DIVIDING MEMBRANES. - TWIN A AND B PLACENTAS SHOWING FEATURES OF UTERO PLACENTAL INSUFFICIENCY.   GROSS DESCRIPTION:  A. Labeled: portion right tube   Size: 1.6 x 0.8 x 0.6 cm  Other findings: purple tubular fragment  Block summary: 1- representative cross-sections  B. Labeled: portion of left tube  Size: 0.1 x 0.8 x 0.6 cm  Other findings: pink tubular fragment  Block summary: 1- representative cross-sections  C. Labeled: patient's name and medical record number and "placenta"  Weight : 886 grams  Dimensions: 27.3 x 18.5 by up to 3 cm  Shape: roughly ovoid  Accessory lobes:  0  Dividing membranes: translucent pink  Designation of placentas: not designated by Psychologist, sport and exercise, 1 with one cord clamp Arbitrarily Designated Twin A and 1 with 2 cord clamps arbitrarily designated twin B  Twin A:      Umbilical cord:           Size -26.3 cm in length x 1.5 cm in diameter           Number of vessels -3           Insertion -eccentric           Distance of insertion from margin -0.8 cm      Fetal surface:           Size: 18.3 x 9.1 x 3 cm           Description: granular bluegray prominent radiating vessels and  with twin A and twin B sharing 2 small vessels      Maternal surface: diffusely disrupted and shaggy      Other findings: Twin A comprises approximately 40% of the parenchymal volume  Twin B:       Umbilical cord:           Size -27.3 cm in length x up to 1.5 cm in diameter           Number of vessels -3           Insertion -eccentric           Distance of insertion from margin -4.1 cm      Fetal surface:           Size: 20.1 x 13.2 by thickness ranging from 0.5 up to 3 cm           Description: granular bluegray prominent radiating vessels      Maternal surface: focally disrupted red  tan      Other findings: Twin B comprises approximately 60% of the parenchymal  volume   Block summary: 1-representative dividing membranes 2-twin A umbilical cord 3-twin A membranes 4-5-twin A parenchyma 6-twin B umbilical cord 7-twin B membranes 8-9-twin B parenchyma Final Diagnosis performed by Delorse Lek, MD.  Electronically signed 07/12/2017 3:41:50PM    The electronic s ignature indicates that the named Attending Pathologist has evaluated the specimen  Technical component performed at [facility], [address], [place], [place] [address] Lab: [phone] Dir: Darrick Penna. Evette Doffing, MD  Professional component performed at [facility], [facility], [place], [place], [place] [address] Lab: [phone] Dir: Dellia Nims. Rubinas, MD    CBC     Status: Abnormal   Collection Time: 07/12/17  6:25 AM  Result Value Ref Range   WBC 9.1 3.6 - 11.0 K/uL   RBC 3.06 (L) 3.80 - 5.20 MIL/uL   Hemoglobin 9.5 (L) 12.0 - 16.0 g/dL   HCT 27.1 (L) 35.0 - 47.0 %   MCV 88.5 80.0 - 100.0 fL   MCH 30.9 26.0 - 34.0 pg   MCHC 34.9 32.0 - 36.0 g/dL   RDW 13.9 11.5 - 14.5 %   Platelets 173 150 - 440 K/uL     Assessment:   26 y.o. [id number] postoperativeday # 2-stable  Continue postoperative/postpartum care  ambulate   Plan:  1) Acute blood loss anemia - hemodynamically stable and asymptomatic - po iron and vitamins  2) B POS/ RI  3) TDAP given AP  4) Breast/Bottle  5) Contraception: BTL  6) Disposition: patient desires discharge tomorrow  Dalia Heading, CNM

## 2017-07-13 NOTE — Lactation Note (Signed)
This note was copied from a baby's chart. Lactation Consultation Note  Patient Name: Sharon Chandler ZOXWR'U Date: 07/13/2017     Maternal Data  Mom is pumping breasts with Symphony medela pump, beginning to get more EBM, last  10 cc, mom loaned electric [employer]actina breast pump to use until she is able to obtain [facility] pump next week, referral faxed to [facility] and peer counselor saw her today  Feeding Feeding Type: Formula Length of feed: 30 min  LATCH Score                   Interventions    Lactation Tools Discussed/Used     Consult Status      [name] 07/13/2017, 8:27 PM

## 2017-07-14 MED ORDER — OXYCODONE-ACETAMINOPHEN 5-325 MG PO TABS: 1.0000 | ORAL_TABLET | Freq: Four times a day (QID) | ORAL | 0 refills | Status: DC | PRN

## 2017-07-14 NOTE — Progress Notes (Addendum)
Pt discharged.  Discharge instructions, prescriptions and follow up appointment given to and reviewed with pt. Pt verbalized understanding. Pt ambulated out. 

## 2017-07-17 ENCOUNTER — Ambulatory Visit (HOSPITAL_COMMUNITY): Admission: RE | Admit: 2017-07-17 | Payer: Medicaid Other | Source: Ambulatory Visit

## 2017-07-24 ENCOUNTER — Ambulatory Visit (HOSPITAL_COMMUNITY): Admission: RE | Admit: 2017-07-24 | Payer: Medicaid Other | Source: Ambulatory Visit

## 2017-07-25 ENCOUNTER — Encounter: Payer: Self-pay | Admitting: *Deleted

## 2017-07-25 ENCOUNTER — Ambulatory Visit: Payer: Medicaid Other | Admitting: *Deleted

## 2017-07-25 VITALS — BP 128/82 | Wt 151.0 lb

## 2017-07-25 DIAGNOSIS — Z4889 Encounter for other specified surgical aftercare: Secondary | ICD-10-CM

## 2017-07-25 NOTE — Progress Notes (Signed)
Subjective:     Sharon HarvestBreanna A Chandler is a 26 y.o. female who presents to the clinic 2 weeks status post cesarean section for twin gestation with one twin breech presentation. Eating a regular diet without difficulty. Bowel movements are normal. The patient is not having any pain.  The following portions of the patient's history were reviewed and updated as appropriate: allergies, current medications, past family history, past medical history, past social history, past surgical history and problem list.  Review of Systems Pertinent items are noted in HPI.    Objective:    There were no vitals taken for this visit. General:  alert and no distress  Abdomen: soft, bowel sounds active, non-tender  Incision:   healing well, no drainage, no erythema, no hernia, no seroma, no swelling, no dehiscence, incision well approximated     Assessment:    Doing well postoperatively.   Plan:    1. Continue any current medications. 2. Wound care discussed. 3. Activity restrictions: none 4. Anticipated return to work: not applicable. 5. Follow up: 4 weeks for Postpartum Visit.  Sharon Chandler, CMA    Attestation of Attending Supervision of CMA/RN: Evaluation and management procedures were performed by theCMA/RN under my supervision.  I have reviewed the CMA's note and chart, and I agree with the management and plan. I personally inspected the incision.   Federico FlakeKimberly Niles Newton, MD, MPH, ABFM Attending Physician Faculty Practice- [facility]

## 2017-08-08 ENCOUNTER — Ambulatory Visit (INDEPENDENT_AMBULATORY_CARE_PROVIDER_SITE_OTHER): Payer: Medicaid Other | Admitting: Family Medicine

## 2017-08-08 ENCOUNTER — Encounter: Payer: Self-pay | Admitting: Family Medicine

## 2017-08-08 DIAGNOSIS — Z1389 Encounter for screening for other disorder: Secondary | ICD-10-CM | POA: Diagnosis not present

## 2017-08-08 NOTE — Progress Notes (Signed)
Post Partum Exam  Sharon Chandler is a 26 y.o. [phone] female who presents for a postpartum visit - Mono-Di Twin delivery. She is 4 weeks postpartum following a low cervical transverse Cesarean section. I have fully reviewed the prenatal and intrapartum course. The delivery was at 33.1 gestational weeks.  Anesthesia: epidural. Postpartum course has been unremarkable. Babies home from NICU 07/28/17. Baby is feeding by bottle - Enfamil AR. Bleeding no bleeding. Bowel function is normal. Bladder function is normal. Patient is sexually active. Contraception method is tubal ligation. Postpartum depression screening:neg      Edinburgh Postnatal Depression Scale - 08/08/17 0947      Edinburgh Postnatal Depression Scale:  In the Past 7 Days   I have been able to laugh and see the funny side of things. 0   I have looked forward with enjoyment to things. 0   I have blamed myself unnecessarily when things went wrong. 1   I have been anxious or worried for no good reason. 0   I have felt scared or panicky for no good reason. 0   Things have been getting on top of me. 0   I have been so unhappy that I have had difficulty sleeping. 0   I have felt sad or miserable. 0   I have been so unhappy that I have been crying. 1   The thought of harming myself has occurred to me. 0   Edinburgh Postnatal Depression Scale Total 2      The following portions of the patient's history were reviewed and updated as appropriate: allergies, current medications, past family history, past medical history, past social history, past surgical history and problem list.  Review of Systems Pertinent items are noted in HPI.    Objective:  unknown if currently breastfeeding.  General:  alert, cooperative and appears stated age   Breasts:  No complaints today. not examined  Lungs: Normal WOB  Heart:  regular rate and rhythm, S1, S2 normal, no murmur, click, rub or gallop  Abdomen: soft, non-tender; bowel sounds normal;  no masses,  no organomegaly. C-section scar c/d/i   Vulva:  normal  Vagina: not evaluated  Cervix:  not examined  Corpus: not examined  Adnexa:  not evaluated  Rectal Exam: Not performed.        Assessment:   Normal postpartum exam. Pap smear not done at today's visit.   Plan:   1. Contraception: tubal ligation 2. Mood- negative EPDS. Discussed postpartum depression and resources 3. Infant feeding- mixxed breast and bottle. Discussed alternating breastfeeding and formula feeding between twins-- she would like to maximize BF. Reviewed draining breast every 2-3 hours. Reports good latch and audible swallows 3. HCM- next pap in April 2022  Follow up in: 1 year or  as needed.

## 2017-09-12 ENCOUNTER — Other Ambulatory Visit: Payer: Medicaid Other | Admitting: *Deleted

## 2017-09-12 ENCOUNTER — Other Ambulatory Visit (HOSPITAL_COMMUNITY)
Admission: RE | Admit: 2017-09-12 | Discharge: 2017-09-12 | Disposition: A | Discharge status: Home / Self Care | Payer: Medicaid Other | Source: Ambulatory Visit | Attending: Obstetrics & Gynecology | Admitting: Obstetrics & Gynecology

## 2017-09-12 DIAGNOSIS — R300 Dysuria: Secondary | ICD-10-CM | POA: Insufficient documentation

## 2017-09-12 MED ORDER — CIPROFLOXACIN HCL 500 MG PO TABS: 500.0000 mg | ORAL_TABLET | Freq: Two times a day (BID) | ORAL | 0 refills | Status: DC

## 2017-09-12 NOTE — Progress Notes (Signed)
SUBJECTIVE: Sharon HarvestBreanna A Hendricksen is a 26 y.o. female who complains of urinary frequency, urgency and dysuria x 4 days, without flank pain, fever, or chills.  She c/o normal vaginal discharge or bleeding.   OBJECTIVE: Appears well, in no apparent distress.  Vital signs are normal. Urine dipstick shows positive for WBC's and positive for RBC's.    ASSESSMENT: Dysuria  PLAN: Treatment per orders.  Call or return to clinic prn if these symptoms worsen or fail to improve as anticipated.

## 2017-09-13 NOTE — Progress Notes (Signed)
Agree with nursing staff's documentation of this patient's clinic encounter.  Sharon CollinsUgonna Dennard Vezina, MD 09/13/2017 9:03 AM

## 2017-09-14 LAB — URINE CYTOLOGY ANCILLARY ONLY
CHLAMYDIA, DNA PROBE: NEGATIVE
Neisseria Gonorrhea: NEGATIVE
Trichomonas: NEGATIVE

## 2017-09-15 LAB — CULTURE, URINE COMPREHENSIVE

## 2017-09-20 LAB — URINE CYTOLOGY ANCILLARY ONLY: Candida vaginitis: NEGATIVE

## 2017-09-21 ENCOUNTER — Other Ambulatory Visit: Payer: Self-pay | Admitting: Obstetrics & Gynecology

## 2017-09-21 DIAGNOSIS — B9689 Other specified bacterial agents as the cause of diseases classified elsewhere: Secondary | ICD-10-CM

## 2017-09-21 DIAGNOSIS — N760 Acute vaginitis: Principal | ICD-10-CM

## 2017-09-21 MED ORDER — METRONIDAZOLE 500 MG PO TABS: 500.0000 mg | ORAL_TABLET | Freq: Two times a day (BID) | ORAL | 0 refills | Status: DC

## 2018-03-03 ENCOUNTER — Other Ambulatory Visit: Payer: Self-pay

## 2018-03-03 ENCOUNTER — Emergency Department
Admission: EM | Admit: 2018-03-03 | Discharge: 2018-03-04 | Disposition: A | Discharge status: Home / Self Care | Payer: Self-pay | Attending: Emergency Medicine | Admitting: Emergency Medicine

## 2018-03-03 ENCOUNTER — Emergency Department: Payer: Self-pay

## 2018-03-03 DIAGNOSIS — N10 Acute tubulo-interstitial nephritis: Secondary | ICD-10-CM | POA: Insufficient documentation

## 2018-03-03 DIAGNOSIS — Z79899 Other long term (current) drug therapy: Secondary | ICD-10-CM | POA: Insufficient documentation

## 2018-03-03 DIAGNOSIS — R109 Unspecified abdominal pain: Secondary | ICD-10-CM

## 2018-03-03 DIAGNOSIS — N12 Tubulo-interstitial nephritis, not specified as acute or chronic: Secondary | ICD-10-CM

## 2018-03-03 DIAGNOSIS — R1084 Generalized abdominal pain: Secondary | ICD-10-CM | POA: Insufficient documentation

## 2018-03-03 DIAGNOSIS — Z87891 Personal history of nicotine dependence: Secondary | ICD-10-CM | POA: Insufficient documentation

## 2018-03-03 DIAGNOSIS — N390 Urinary tract infection, site not specified: Secondary | ICD-10-CM | POA: Insufficient documentation

## 2018-03-03 LAB — URINALYSIS, COMPLETE (UACMP) WITH MICROSCOPIC
BILIRUBIN URINE: NEGATIVE
GLUCOSE, UA: NEGATIVE mg/dL
Hgb urine dipstick: NEGATIVE
KETONES UR: NEGATIVE mg/dL
NITRITE: POSITIVE — AB
PH: 5 (ref 5.0–8.0)
Protein, ur: 100 mg/dL — AB
Specific Gravity, Urine: 1.02 (ref 1.005–1.030)

## 2018-03-03 LAB — POCT PREGNANCY, URINE
Preg Test, Ur: NEGATIVE
Preg Test, Ur: NEGATIVE

## 2018-03-03 LAB — CBC
HCT: 40.6 % (ref 35.0–47.0)
HEMOGLOBIN: 13.8 g/dL (ref 12.0–16.0)
MCH: 31.5 pg (ref 26.0–34.0)
MCHC: 34 g/dL (ref 32.0–36.0)
MCV: 92.8 fL (ref 80.0–100.0)
Platelets: 277 10*3/uL (ref 150–440)
RBC: 4.37 MIL/uL (ref 3.80–5.20)
RDW: 13 % (ref 11.5–14.5)
WBC: 12.1 10*3/uL — AB (ref 3.6–11.0)

## 2018-03-03 LAB — COMPREHENSIVE METABOLIC PANEL
ALK PHOS: 53 U/L (ref 38–126)
ALT: 12 U/L — ABNORMAL LOW (ref 14–54)
ANION GAP: 6 (ref 5–15)
AST: 19 U/L (ref 15–41)
Albumin: 4.3 g/dL (ref 3.5–5.0)
BUN: 12 mg/dL (ref 6–20)
CALCIUM: 9 mg/dL (ref 8.9–10.3)
CO2: 27 mmol/L (ref 22–32)
Chloride: 104 mmol/L (ref 101–111)
Creatinine, Ser: 0.74 mg/dL (ref 0.44–1.00)
GFR calc non Af Amer: >60 mL/min (ref >60)
Glucose, Bld: 148 mg/dL — ABNORMAL HIGH (ref 65–99)
Potassium: 3.7 mmol/L (ref 3.5–5.1)
SODIUM: 137 mmol/L (ref 135–145)
TOTAL PROTEIN: 7 g/dL (ref 6.5–8.1)
Total Bilirubin: 0.4 mg/dL (ref 0.3–1.2)

## 2018-03-03 MED ORDER — KETOROLAC TROMETHAMINE 30 MG/ML IJ SOLN
10.0000 mg | Freq: Once | INTRAMUSCULAR | Status: AC
  Administered 2018-03-03: 9.9 mg via INTRAVENOUS
  Filled 2018-03-03: qty 1

## 2018-03-03 MED ORDER — SODIUM CHLORIDE 0.9 % IV SOLN
1.0000 g | Freq: Once | INTRAVENOUS | Status: AC
  Administered 2018-03-03: 1 g via INTRAVENOUS
  Filled 2018-03-03: qty 10

## 2018-03-03 MED ORDER — SODIUM CHLORIDE 0.9 % IV BOLUS
1000.0000 mL | Freq: Once | INTRAVENOUS | Status: AC
  Administered 2018-03-03: 1000 mL via INTRAVENOUS
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
## 2018-03-03 NOTE — ED Triage Notes (Signed)
Patient reports symptoms for 2 weeks.  Reports left lower back pain, left lower quad abdominal pain and dysuria.

## 2018-03-04 MED ORDER — CEPHALEXIN 500 MG PO CAPS: 500.0000 mg | ORAL_CAPSULE | Freq: Three times a day (TID) | ORAL | 0 refills | Status: DC

## 2018-03-04 MED ORDER — PHENAZOPYRIDINE HCL 200 MG PO TABS: 200.0000 mg | ORAL_TABLET | Freq: Three times a day (TID) | ORAL | 0 refills | Status: DC | PRN

## 2018-03-04 NOTE — Discharge Instructions (Addendum)
1.  Take antibiotic as prescribed (Keflex 500 mg 3 times daily for 7 days). °2.  Take Pyridium as prescribed for urinary discomfort. °3.  Return to the ER for worsening symptoms, persistent vomiting, fever or other concerns. °

## 2018-03-08 LAB — URINE CULTURE

## 2018-03-27 ENCOUNTER — Other Ambulatory Visit: Payer: Self-pay

## 2018-03-27 ENCOUNTER — Emergency Department
Admission: EM | Admit: 2018-03-27 | Discharge: 2018-03-27 | Disposition: A | Discharge status: Home / Self Care | Payer: Self-pay | Attending: Student in an Organized Health Care Education/Training Program | Admitting: Student in an Organized Health Care Education/Training Program

## 2018-03-27 ENCOUNTER — Encounter: Payer: Self-pay | Admitting: Emergency Medicine

## 2018-03-27 ENCOUNTER — Emergency Department: Payer: Self-pay

## 2018-03-27 DIAGNOSIS — N3001 Acute cystitis with hematuria: Secondary | ICD-10-CM | POA: Insufficient documentation

## 2018-03-27 DIAGNOSIS — Z87891 Personal history of nicotine dependence: Secondary | ICD-10-CM | POA: Insufficient documentation

## 2018-03-27 DIAGNOSIS — R109 Unspecified abdominal pain: Secondary | ICD-10-CM

## 2018-03-27 LAB — URINALYSIS, COMPLETE (UACMP) WITH MICROSCOPIC
Bilirubin Urine: NEGATIVE
Glucose, UA: NEGATIVE mg/dL
Ketones, ur: NEGATIVE mg/dL
NITRITE: POSITIVE — AB
PROTEIN: 100 mg/dL — AB
Specific Gravity, Urine: 1.016 (ref 1.005–1.030)
Squamous Epithelial / LPF: >50 — ABNORMAL HIGH (ref 0–5)
WBC, UA: >50 WBC/hpf — ABNORMAL HIGH (ref 0–5)
pH: 6 (ref 5.0–8.0)

## 2018-03-27 LAB — CBC WITH DIFFERENTIAL/PLATELET
Basophils Absolute: 0 10*3/uL (ref 0–0.1)
Basophils Relative: 0 %
Eosinophils Absolute: 0.3 10*3/uL (ref 0–0.7)
Eosinophils Relative: 4 %
HEMATOCRIT: 38.9 % (ref 35.0–47.0)
HEMOGLOBIN: 13.3 g/dL (ref 12.0–16.0)
LYMPHS ABS: 1.7 10*3/uL (ref 1.0–3.6)
Lymphocytes Relative: 24 %
MCH: 31.5 pg (ref 26.0–34.0)
MCHC: 34.2 g/dL (ref 32.0–36.0)
MCV: 92 fL (ref 80.0–100.0)
MONO ABS: 0.5 10*3/uL (ref 0.2–0.9)
Monocytes Relative: 6 %
NEUTROS ABS: 4.8 10*3/uL (ref 1.4–6.5)
NEUTROS PCT: 66 %
Platelets: 247 10*3/uL (ref 150–440)
RBC: 4.23 MIL/uL (ref 3.80–5.20)
RDW: 13.6 % (ref 11.5–14.5)
WBC: 7.3 10*3/uL (ref 3.6–11.0)

## 2018-03-27 LAB — COMPREHENSIVE METABOLIC PANEL
ALK PHOS: 58 U/L (ref 38–126)
ALT: 13 U/L — ABNORMAL LOW (ref 14–54)
ANION GAP: 8 (ref 5–15)
AST: 16 U/L (ref 15–41)
Albumin: 4.4 g/dL (ref 3.5–5.0)
BILIRUBIN TOTAL: 0.4 mg/dL (ref 0.3–1.2)
BUN: 14 mg/dL (ref 6–20)
CALCIUM: 8.9 mg/dL (ref 8.9–10.3)
CO2: 25 mmol/L (ref 22–32)
Chloride: 104 mmol/L (ref 101–111)
Creatinine, Ser: 0.58 mg/dL (ref 0.44–1.00)
GFR calc Af Amer: >60 mL/min (ref >60)
GFR calc non Af Amer: >60 mL/min (ref >60)
GLUCOSE: 103 mg/dL — AB (ref 65–99)
POTASSIUM: 3.5 mmol/L (ref 3.5–5.1)
Sodium: 137 mmol/L (ref 135–145)
TOTAL PROTEIN: 7.1 g/dL (ref 6.5–8.1)

## 2018-03-27 LAB — LIPASE, BLOOD: LIPASE: 36 U/L (ref 11–51)

## 2018-03-27 LAB — POC URINE PREG, ED: Preg Test, Ur: NEGATIVE

## 2018-03-27 MED ORDER — MORPHINE SULFATE (PF) 4 MG/ML IV SOLN
4.0000 mg | INTRAVENOUS | Status: DC | PRN
  Administered 2018-03-27: 4 mg via INTRAVENOUS
  Filled 2018-03-27: qty 1

## 2018-03-27 MED ORDER — FLUCONAZOLE 150 MG PO TABS: 150.0000 mg | ORAL_TABLET | Freq: Once | ORAL | 0 refills | Status: AC

## 2018-03-27 MED ORDER — ONDANSETRON HCL 4 MG PO TABS: 4.0000 mg | ORAL_TABLET | Freq: Every day | ORAL | 0 refills | Status: AC | PRN

## 2018-03-27 MED ORDER — LEVOFLOXACIN 750 MG PO TABS
750.0000 mg | ORAL_TABLET | Freq: Once | ORAL | Status: AC
  Administered 2018-03-27: 750 mg via ORAL
  Filled 2018-03-27: qty 1

## 2018-03-27 MED ORDER — ONDANSETRON HCL 4 MG/2ML IJ SOLN
4.0000 mg | Freq: Once | INTRAMUSCULAR | Status: AC
  Administered 2018-03-27: 4 mg via INTRAVENOUS
  Filled 2018-03-27: qty 2

## 2018-03-27 MED ORDER — LEVOFLOXACIN 750 MG PO TABS: 750.0000 mg | ORAL_TABLET | Freq: Every day | ORAL | 0 refills | Status: AC

## 2018-03-27 MED ORDER — SODIUM CHLORIDE 0.9 % IV BOLUS
1000.0000 mL | Freq: Once | INTRAVENOUS | Status: AC
  Administered 2018-03-27: 1000 mL via INTRAVENOUS
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
## 2018-03-27 NOTE — Discharge Instructions (Signed)

## 2018-03-27 NOTE — ED Triage Notes (Signed)
PT to ED via POV with c/o RT flank pain xfew days. Dysuria, denies fever. VSS

## 2018-04-16 IMAGING — US USMFM FETAL BPP W/O NON-STRESS ADDL GEST
1 series · 14 of 28 positions shown · non-contrast
Comparison: none

[Series 1: usmfm fetal bpp w/o non-stress addl gest · 14 of 88 slices shown]
[im 4/88]
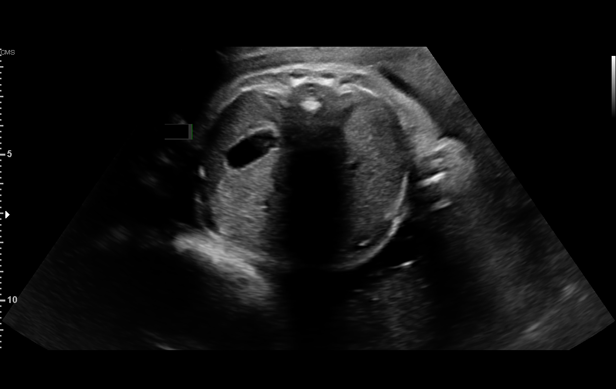
[im 10/88]
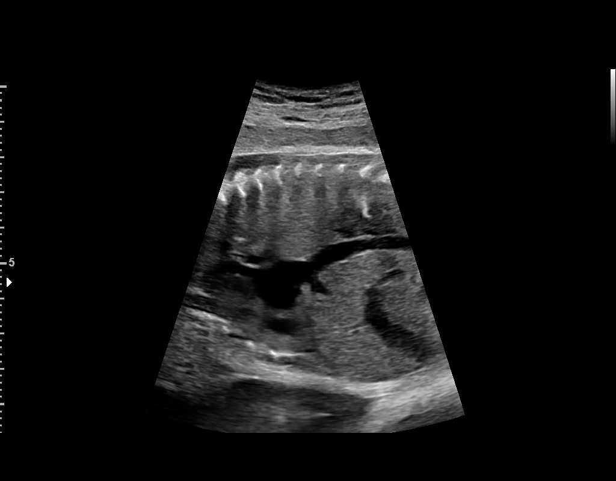
[im 17/88]
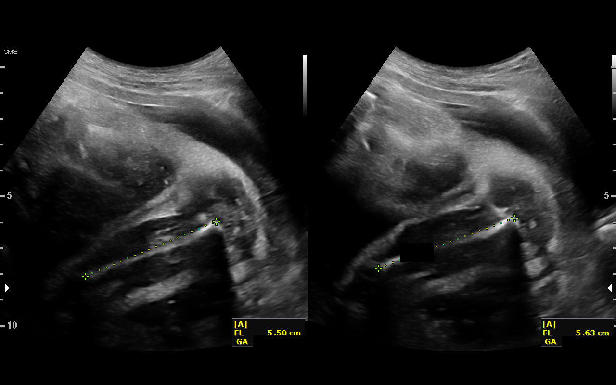
[im 23/88]
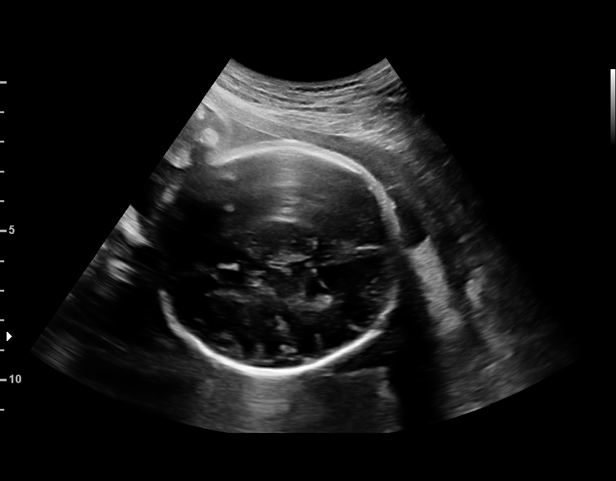
[im 30/88]
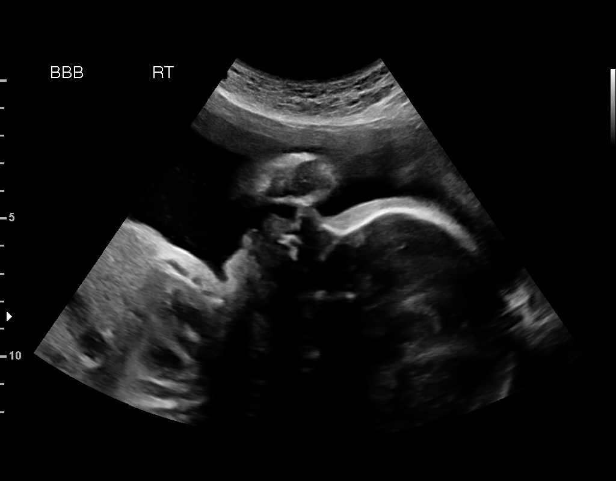
[im 36/88]
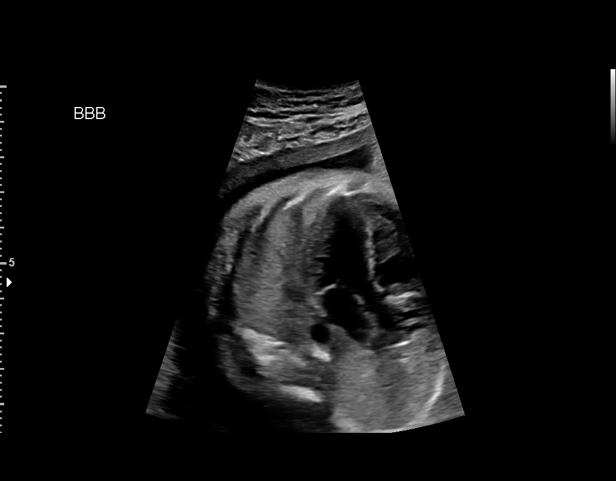
[im 42/88]
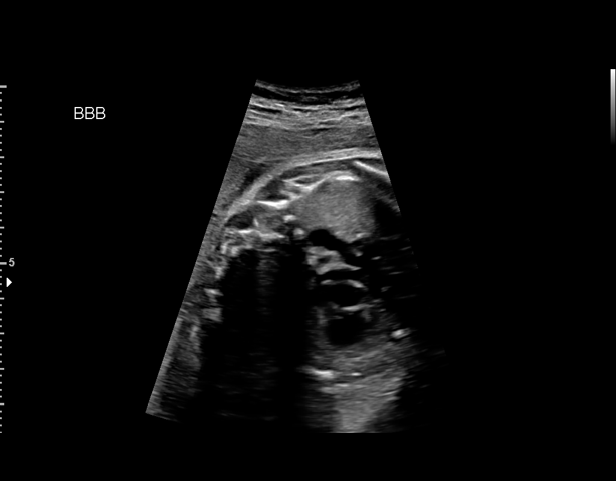
[im 49/88]
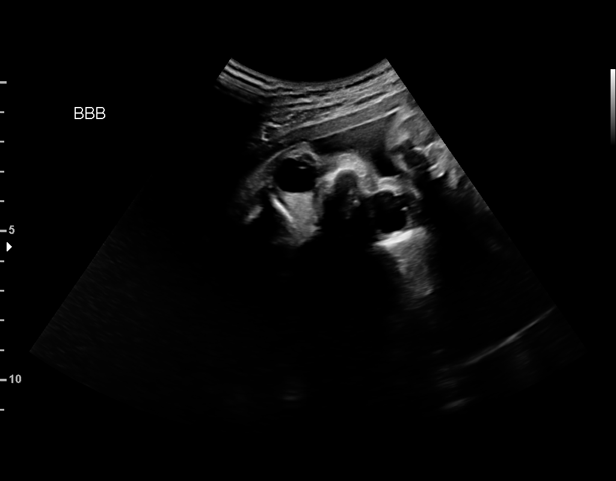
[im 55/88]
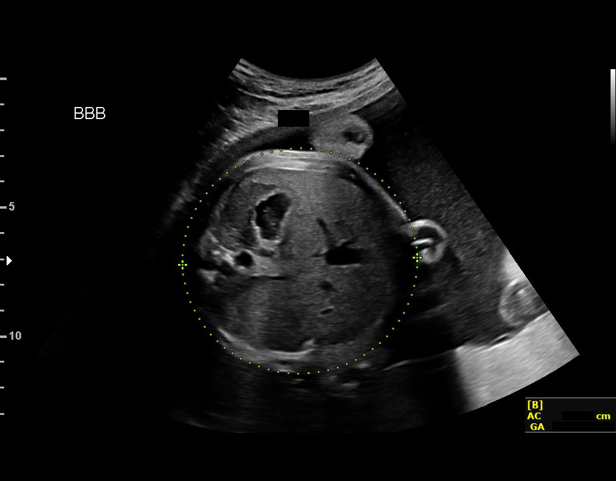
[im 62/88]
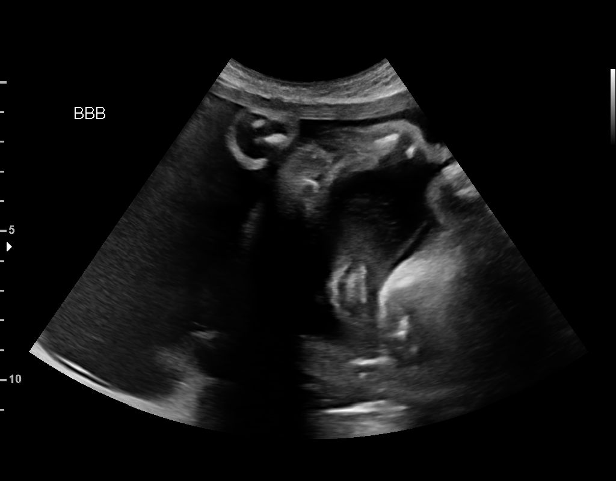
[im 68/88]
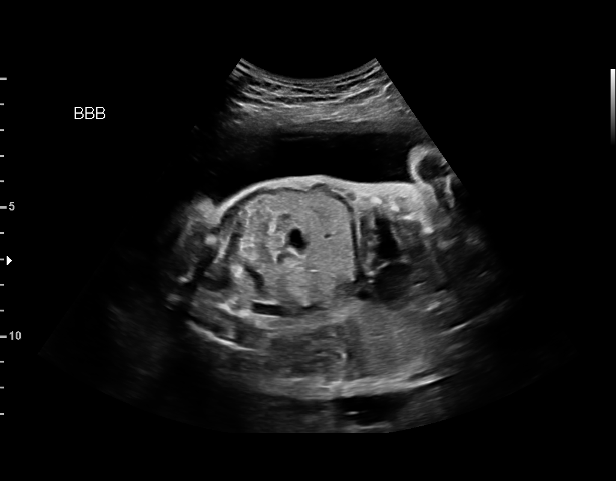
[im 75/88]
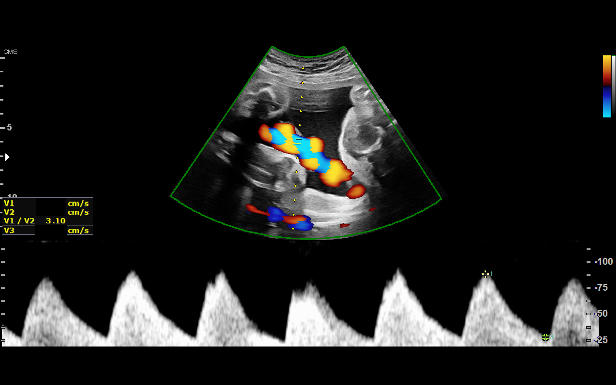
[im 81/88]
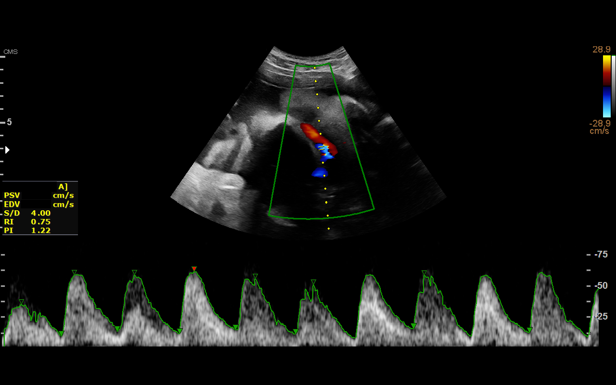
[im 88/88]
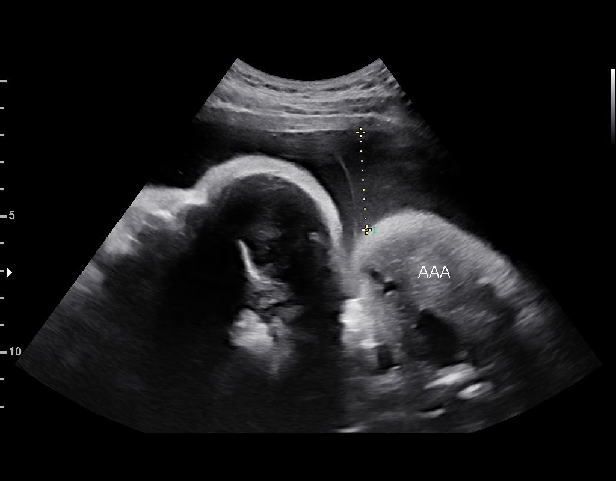

[14 of 28 positions shown; findings below may reference images not displayed]

pm)


GESTATION

1  MAJDAN GRUNGE            [id number]      [id number]     [id number]
2  MAJDAN GRUNGE            [id number]      [id number]     [id number]
3  MAJDAN GRUNGE            [id number]      [id number]     [id number]
4  MAJDAN GRUNGE            [id number]      [id number]     [id number]
5  MAJDAN GRUNGE            [id number]      [id number]     [id number]
6  MAJDAN GRUNGE            [id number]      [id number]     [id number]
Indications

31 weeks gestation of pregnancy
Short interval between pregancies, 3rd
trimester (BMZ [DATE], [DATE])
Twin pregnancy, Kamasa/Polin, third trimester
Maternal care for known or suspected poor
fetal growth, third trimester, fetus 1
OB History

Blood Type:            Height:  5'5"   Weight (lb):  140       BMI:
Gravidity:    3         Term:   2
Living:       2
Fetal Evaluation (Fetus A)

Num Of Fetuses:     2
Fetal Heart         151
Rate(bpm):
Cardiac Activity:   Observed
Fetal Lie:          Lower Left Fetus
Presentation:       Breech
Placenta:           Posterior, above cervical os
P. Cord Insertion:  Previously Visualized

Amniotic Fluid
AFI FV:      Subjectively within normal limits

Largest Pocket(cm)
3.61
Biophysical Evaluation (Fetus A)

Amniotic F.V:   Pocket => 2 cm two         F. Tone:        Observed
planes
F. Movement:    Observed                   Score:          [DATE]
F. Breathing:   Observed
Biometry (Fetus A)

BPD:      74.3  mm     G. Age:  29w 6d         10  %    CI:        73.34   %    70 - 86
FL/HC:      19.9   %    19.3 -
HC:      275.7  mm     G. Age:  30w 1d          5  %    HC/AC:      1.13        0.96 -
AC:      244.5  mm     G. Age:  28w 5d        < 3  %    FL/BPD:     74.0   %    71 - 87
FL:         55  mm     G. Age:  29w 0d          3  %    FL/AC:      22.5   %    20 - 24
HUM:      50.4  mm     G. Age:  29w 4d         21  %

Est. FW:    0333  gm    2 lb 15 oz      19  %     FW Discordancy        21  %
Gestational Age (Fetus A)

LMP:           31w 0d        Date:  11/21/16                 EDD:   08/28/17
U/S Today:     29w 3d                                        EDD:   09/08/17
Best:          31w 0d     Det. By:  LMP  (11/21/16)          EDD:   08/28/17
Anatomy (Fetus A)

Cranium:               Appears normal         Aortic Arch:            Appears normal
Cavum:                 Appears normal         Ductal Arch:            Previously seen
Ventricles:            Appears normal         Diaphragm:              Previously seen
Choroid Plexus:        Appears normal         Stomach:                Appears normal, left
sided
Cerebellum:            Appears normal         Abdomen:                Appears normal
Posterior Fossa:       Appears normal         Abdominal Wall:         Previously seen
Nuchal Fold:           Previously seen        Cord Vessels:           Appears normal (3
vessel cord)
Face:                  Orbits and profile     Kidneys:                Appear normal
previously seen
Lips:                  Appears normal         Bladder:                Appears normal
Thoracic:              Appears normal         Spine:                  Previously seen
Heart:                 Appears normal         Upper Extremities:      Previously seen
(4CH, axis, and
situs)
RVOT:                  Previously seen        Lower Extremities:      Previously seen
LVOT:                  Previously seen

Other:  Female gender. Heels and Nasal bone previously visualized.
Technically difficult due to fetal position.
Doppler - Fetal Vessels (Fetus A)

Umbilical Artery
S/D     %tile     RI              PI              PSV    ADFV    RDFV
(cm/s)
3.9       93   0.74             1.23             71.33      No      No

Fetal Evaluation (Fetus B)

Num Of Fetuses:     2
Fetal Heart         143
Rate(bpm):
Cardiac Activity:   Observed
Fetal Lie:          Upper Right Fetus
Presentation:       Cephalic
Placenta:           Posterior, above cervical os
P. Cord Insertion:  Previously Visualized

Amniotic Fluid
AFI FV:      Polyhydramnios

Largest Pocket(cm)
10.7
Biophysical Evaluation (Fetus B)

Amniotic F.V:   Polyhydramnios             F. Tone:        Observed
F. Movement:    Observed                   Score:          [DATE]
F. Breathing:   Observed
Biometry (Fetus B)

BPD:      75.7  mm     G. Age:  30w 3d         21  %    CI:        71.06   %    70 - 86
FL/HC:      19.5   %    19.3 -
HC:      286.1  mm     G. Age:  31w 3d         25  %    HC/AC:      1.03        0.96 -
AC:       278   mm     G. Age:  31w 6d         72  %    FL/BPD:     73.8   %    71 - 87
FL:       55.9  mm     G. Age:  29w 3d          7  %    FL/AC:      20.1   %    20 - 24
HUM:      50.8  mm     G. Age:  29w 5d         25  %

Est. FW:    9256  gm    3 lb 11 oz      54  %     FW Discordancy     0 \ 21 %
Gestational Age (Fetus B)

LMP:           31w 0d        Date:  11/21/16                 EDD:   08/28/17
U/S Today:     30w 6d                                        EDD:   08/29/17
Best:          31w 0d     Det. By:  LMP  (11/21/16)          EDD:   08/28/17
Anatomy (Fetus B)

Cranium:               Appears normal         Aortic Arch:            Appears normal
Cavum:                 Appears normal         Ductal Arch:            Previously seen
Ventricles:            Appears normal         Diaphragm:              Appears normal
Choroid Plexus:        Appears normal         Stomach:                Appears normal, left
sided
Cerebellum:            Appears normal         Abdomen:                Appears normal
Posterior Fossa:       Appears normal         Abdominal Wall:         Appears nml (cord
insert, abd wall)
Nuchal Fold:           Previously seen        Cord Vessels:           Appears normal (3
vessel cord)
Face:                  Appears normal         Kidneys:                Appear normal
(orbits and profile)
Lips:                  Appears normal         Bladder:                Appears normal
Thoracic:              Appears normal         Spine:                  Previously seen
Heart:                 Appears normal         Upper Extremities:      Previously seen
(4CH, axis, and
situs)
RVOT:                  Previously seen        Lower Extremities:      Previously seen
LVOT:                  Appears normal

Other:  Female gender. Heels, 5th digit, and Nasal bone previously
visualized. Technically difficult due to fetal position.
Doppler - Fetal Vessels (Fetus B)

Umbilical Artery
S/D     %tile     RI              PI              PSV    ADFV    RDFV
(cm/s)
2.92       57   0.66             1.02             82.05      No      No

Cervix Uterus Adnexa

Cervix
Not visualized (advanced GA >35wks)

Uterus
No abnormality visualized.

Left Ovary
Not visualized.

Right Ovary
Not visualized.

Cul De Sac:   No free fluid seen.

Adnexa:       No abnormality visualized.
Impression

Monochorionic diamniotic twin pregnancy at 31+0 weeks,
here for growth evaluation and TTTS check
Normal amniotic fluid volume for twin A, but mild
polyhydramnios on twin B with MVPs of 3.6cm and 10.7 cm
respectively
Interval review of anatomy is normal for both twins: both twins
have visible bladders and stomachs
Growth is in the 19th percentile for A and 51st percentile for
B/ A's abdominal circumfrence is <3rd percentile.
Discordance was 21%
BPP [DATE] in both twins
Umbilical artery dopplers were obtained from both twins, and
both were normal; neither had absent or reversed end
diastolic flow
Recommendations

Discussed findings with patient and family. This does not
appear to be developing TTTS, but we will begin weekly
evaluations due to IUGR of A and polyhydramnios of B, with
BPP and dopplers next week

## 2018-04-30 IMAGING — US US MFM UA CORD DOPPLER
1 series · 15 of 28 positions shown · non-contrast
Comparison: none

[Series 1: us mfm ua cord doppler · 34 acquisitions, 15 frames shown]
[im 1/34]
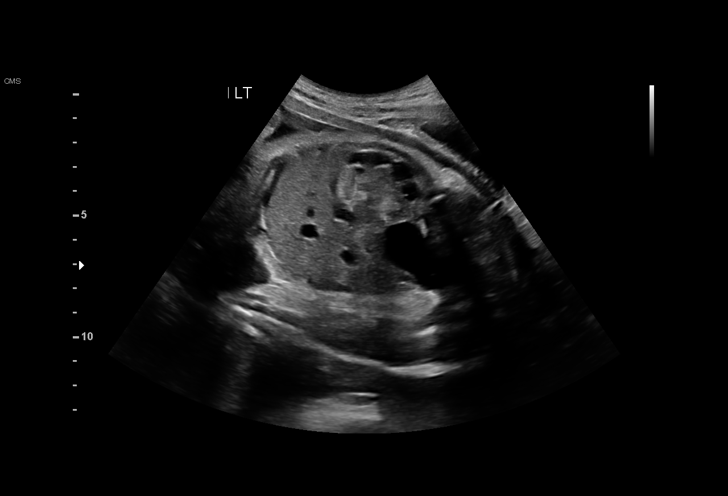
[im 3/34]
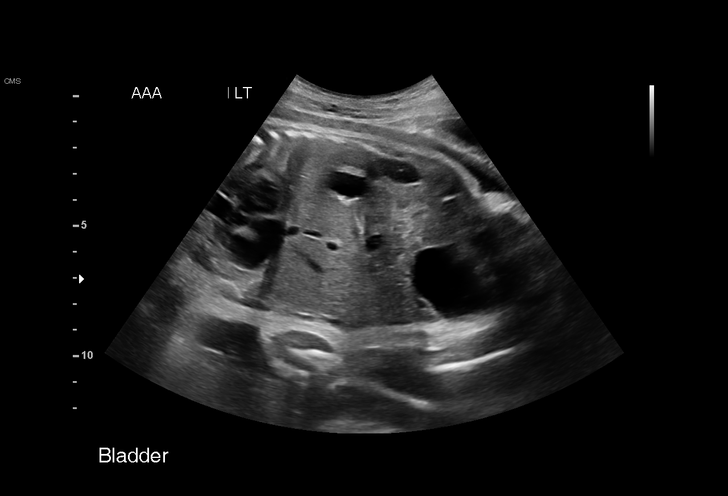
[im 5/34]
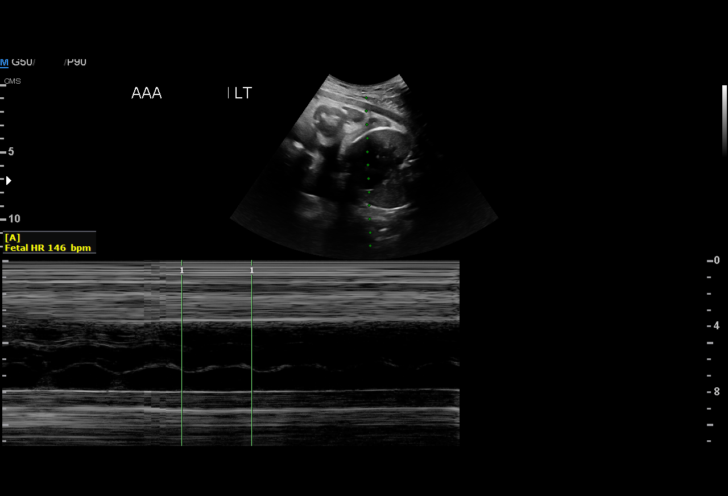
[im 8/34]
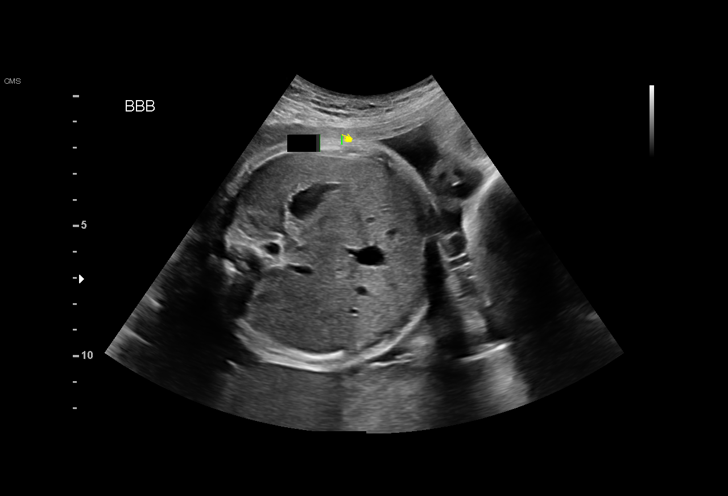
[im 10/34]
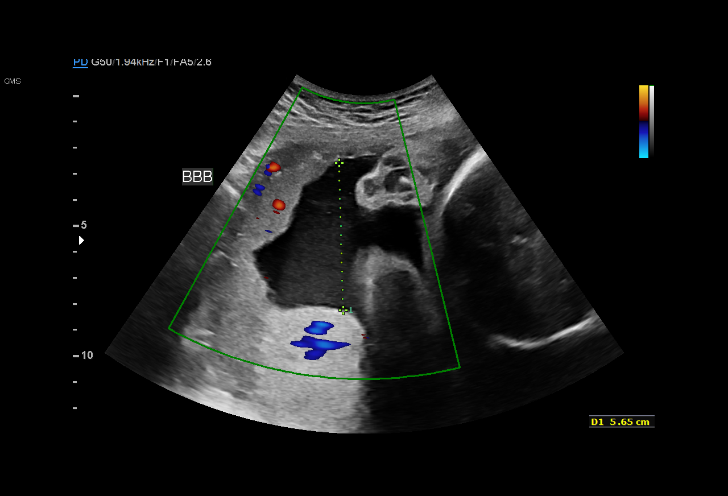
[im 13/34]
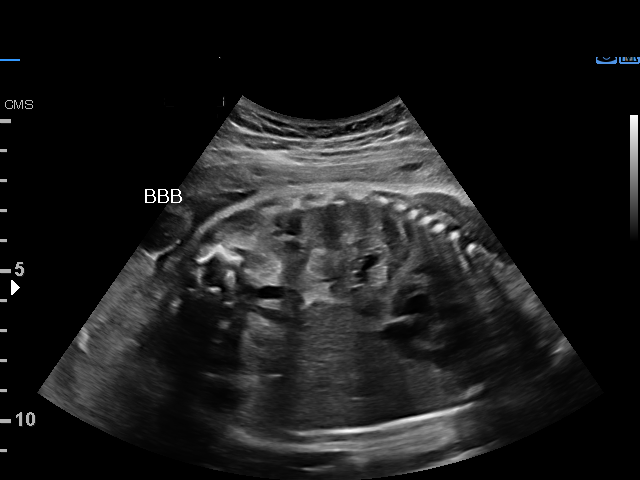
[im 15/34]
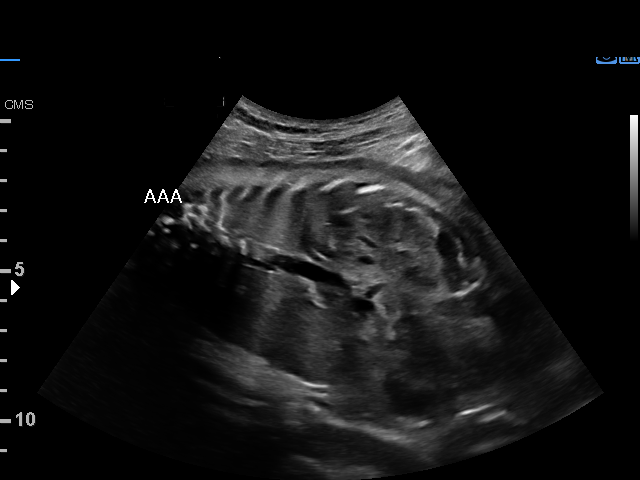
[im 18/34]
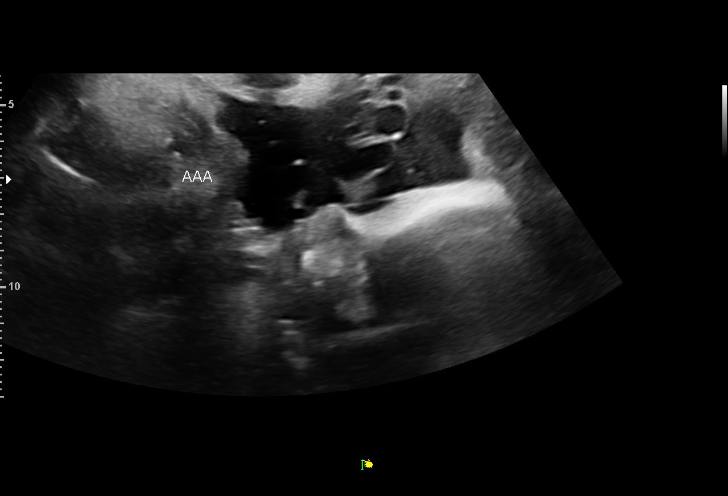
[im 19/34]
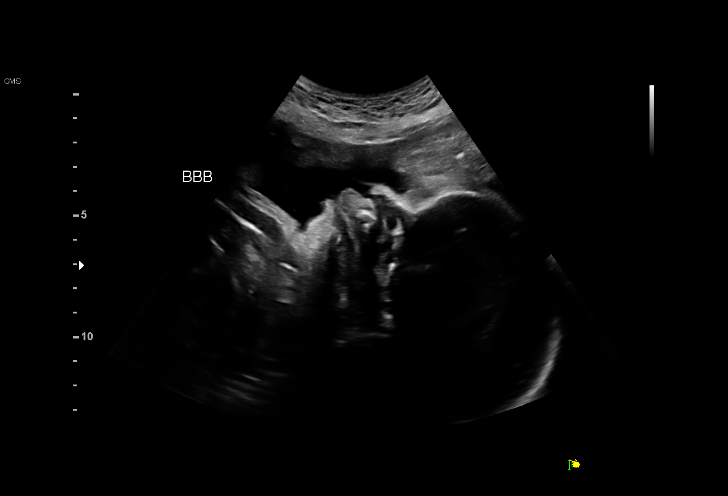
[im 21/34]
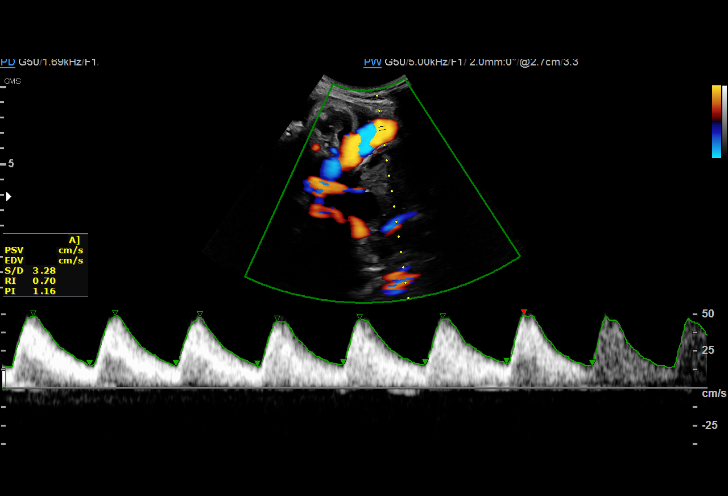
[im 24/34]
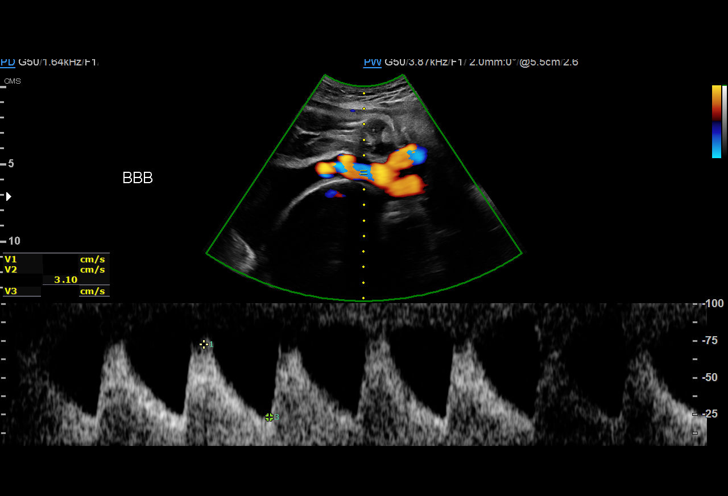
[im 26/34]
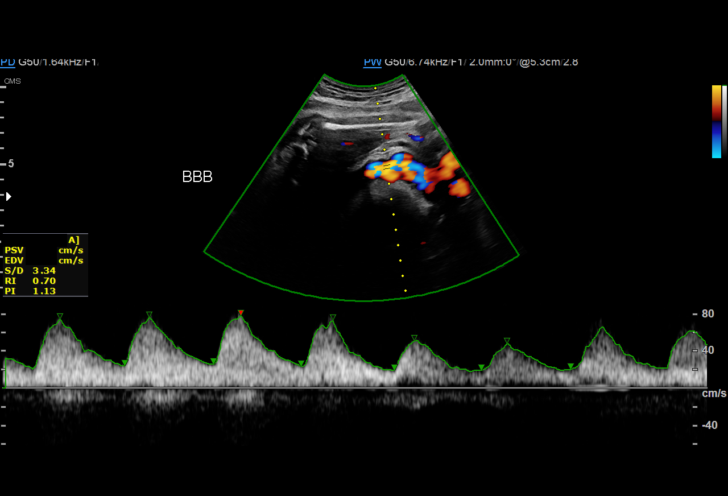
[im 29/34]
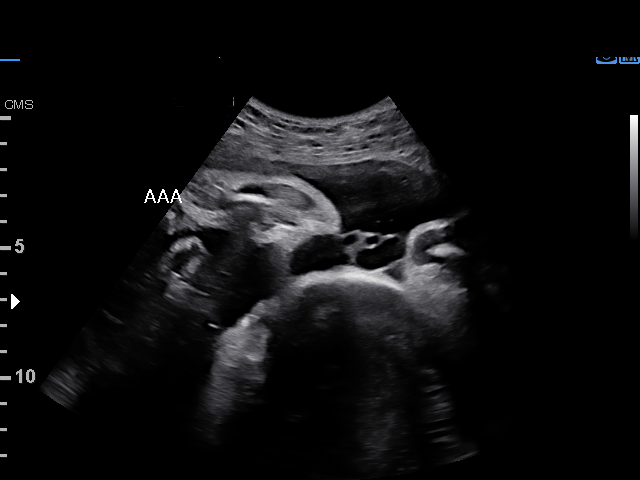
[im 31/34]
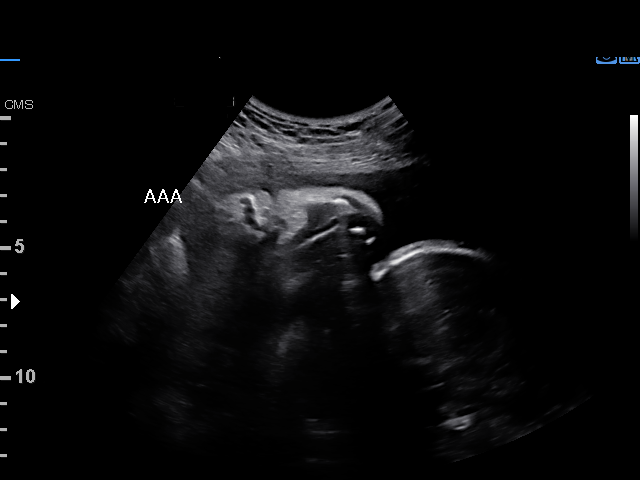
[im 34/34]
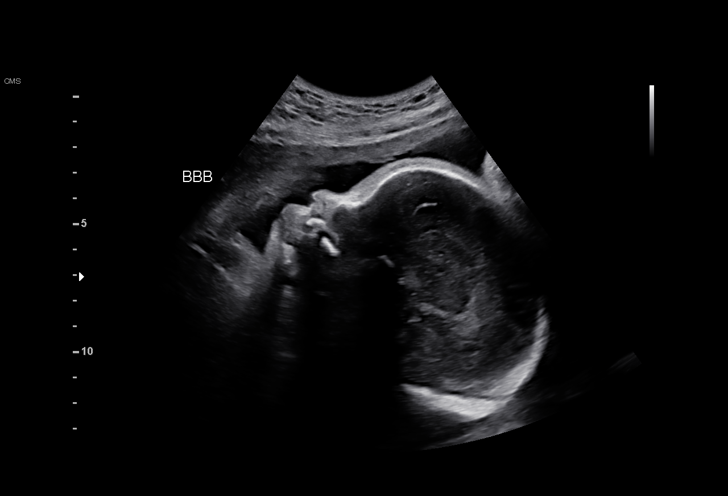

[15 of 28 positions shown; findings below may reference images not displayed]

GESTATION
EVAL

1  DANGIS ANT              [id number]      [id number]     [id number]
2  DANGIS ANT              [id number]      [id number]     [id number]
3  DANGIS ANT              [id number]      [id number]     [id number]
4  DANGIS ANT              [id number]      [id number]     [id number]
Indications

33 weeks gestation of pregnancy
Short interval between pregancies, 3rd
trimester (BMZ [DATE], [DATE])
Twin pregnancy, Yoshitaro/Sayen, third trimester
Maternal care for known or suspected poor
fetal growth, third trimester, fetus 1
OB History

Blood Type:            Height:  5'5"   Weight (lb):  140      BMI:
Gravidity:    3         Term:   2
Living:       2
Fetal Evaluation (Fetus A)

Num Of Fetuses:     2
Fetal Heart         146
Rate(bpm):
Cardiac Activity:   Observed
Fetal Lie:          Maternal left side
Presentation:       Breech
Amniotic Fluid
AFI FV:      Subjectively within normal limits

Largest Pocket(cm)
4.01
Biophysical Evaluation (Fetus A)

Amniotic F.V:   Within normal limits       F. Tone:        Observed
F. Movement:    Observed                   Score:          [DATE]
F. Breathing:   Observed
Gestational Age (Fetus A)

LMP:           33w 0d       Date:   11/21/16                 EDD:   08/28/17
Best:          33w 0d    Det. By:   LMP  (11/21/16)          EDD:   08/28/17
Doppler - Fetal Vessels (Fetus A)

Umbilical Artery
S/D     %tile     RI              PI              PSV    ADFV    RDFV
(cm/s)
3.01       69   0.67             1.09             56.53      No      No

Fetal Evaluation (Fetus B)

Num Of Fetuses:     2
Fetal Heart         155
Rate(bpm):
Cardiac Activity:   Observed
Fetal Lie:          Maternal right side
Presentation:       Cephalic

Amniotic Fluid
AFI FV:      Subjectively within normal limits

Largest Pocket(cm)
5.65
Biophysical Evaluation (Fetus B)

Amniotic F.V:   Within normal limits       F. Tone:        Observed
F. Movement:    Observed                   Score:          [DATE]
F. Breathing:   Observed
Gestational Age (Fetus B)

LMP:           33w 0d       Date:   11/21/16                 EDD:   08/28/17
Best:          33w 0d    Det. By:   LMP  (11/21/16)          EDD:   08/28/17
Doppler - Fetal Vessels (Fetus B)

Umbilical Artery
S/D     %tile     RI              PI              PSV    ADFV    RDFV
(cm/s)
2.82       60   0.65             0.99             90.64      No      No
Impression

Monochorionic/diamniotic twin pregnancy at 33+0 weeks
Twin A with lagging AC
Breech/cephalic presentation
Normal amniotic fluid volume x 2
BPP [DATE] x 2
UA dopplers were normal for this GA x 2
Recommendations

Continue weekly fetal assessments

## 2018-06-06 ENCOUNTER — Encounter: Payer: Self-pay | Admitting: Radiology

## 2019-01-15 IMAGING — CR DG ABDOMEN 1V
2 series · 2 of 2 positions shown · non-contrast
Comparison: CT 03/03/2018

CLINICAL DATA: Right flank pain for several days

EXAM:
ABDOMEN - 1 VIEW

[abdomen kub (1 of 2)]
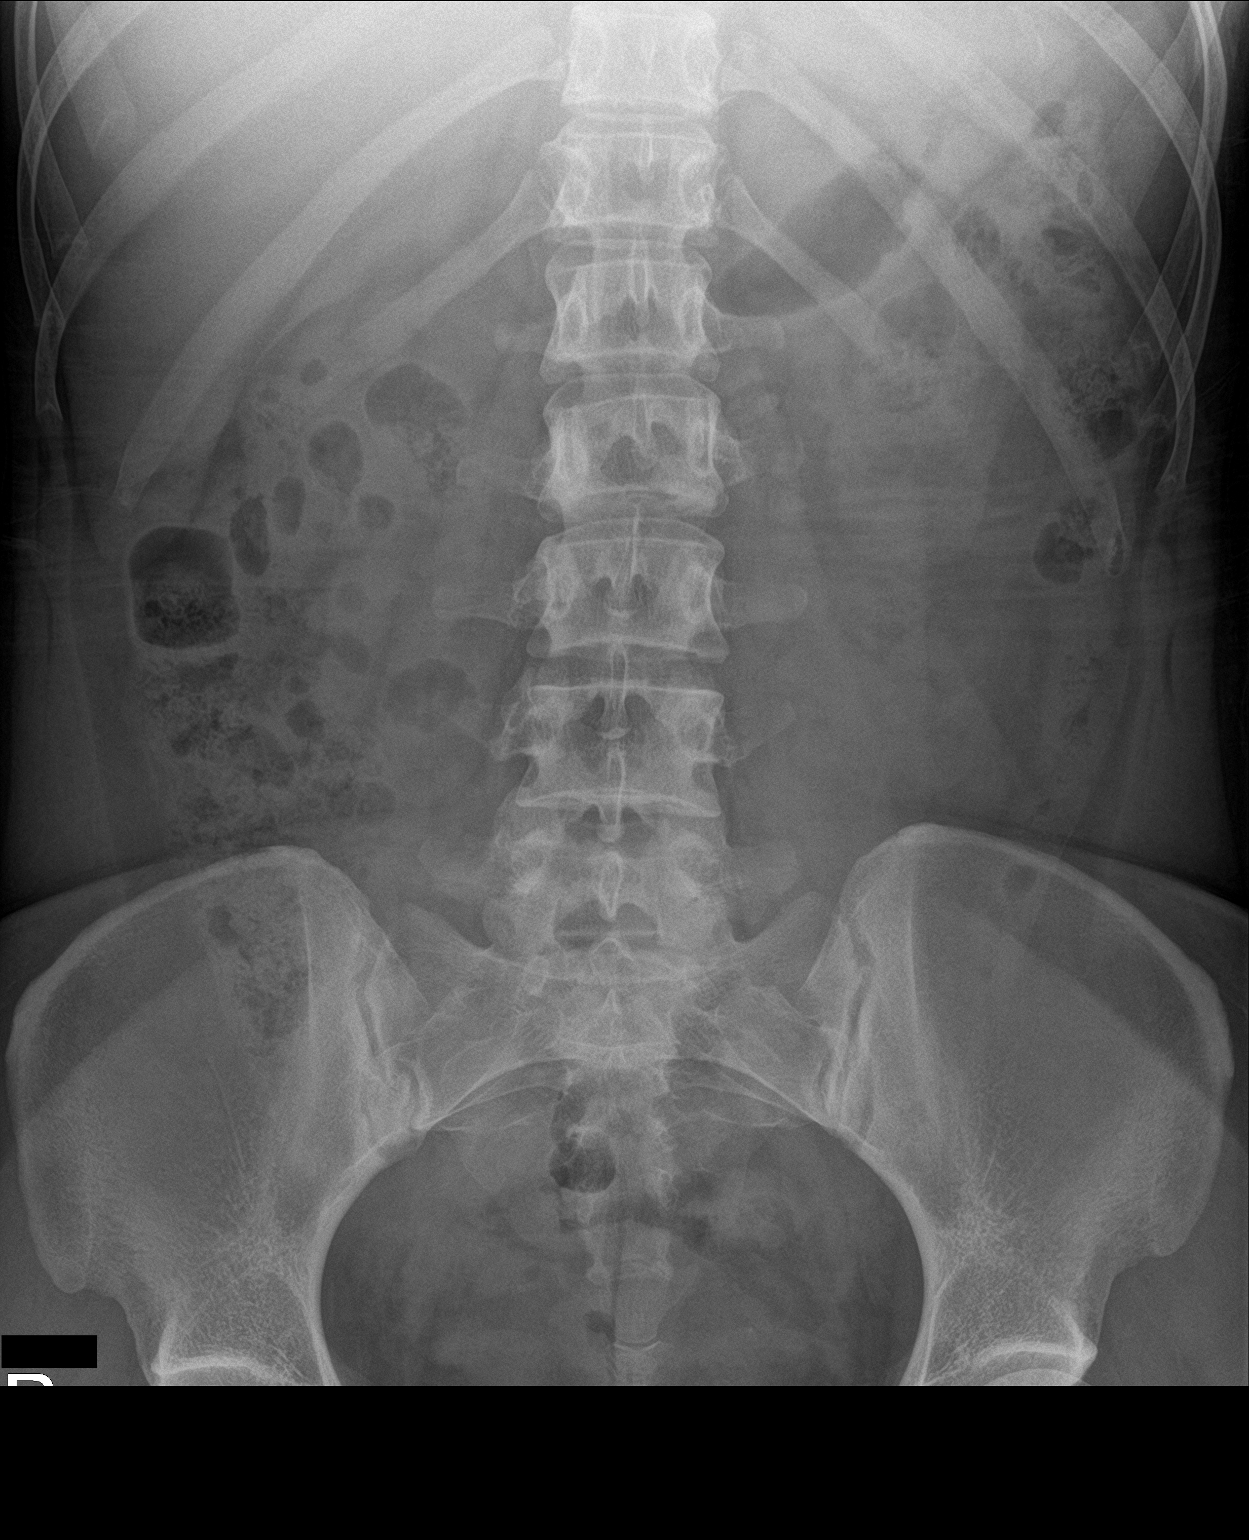

[abdomen kub (2 of 2)]
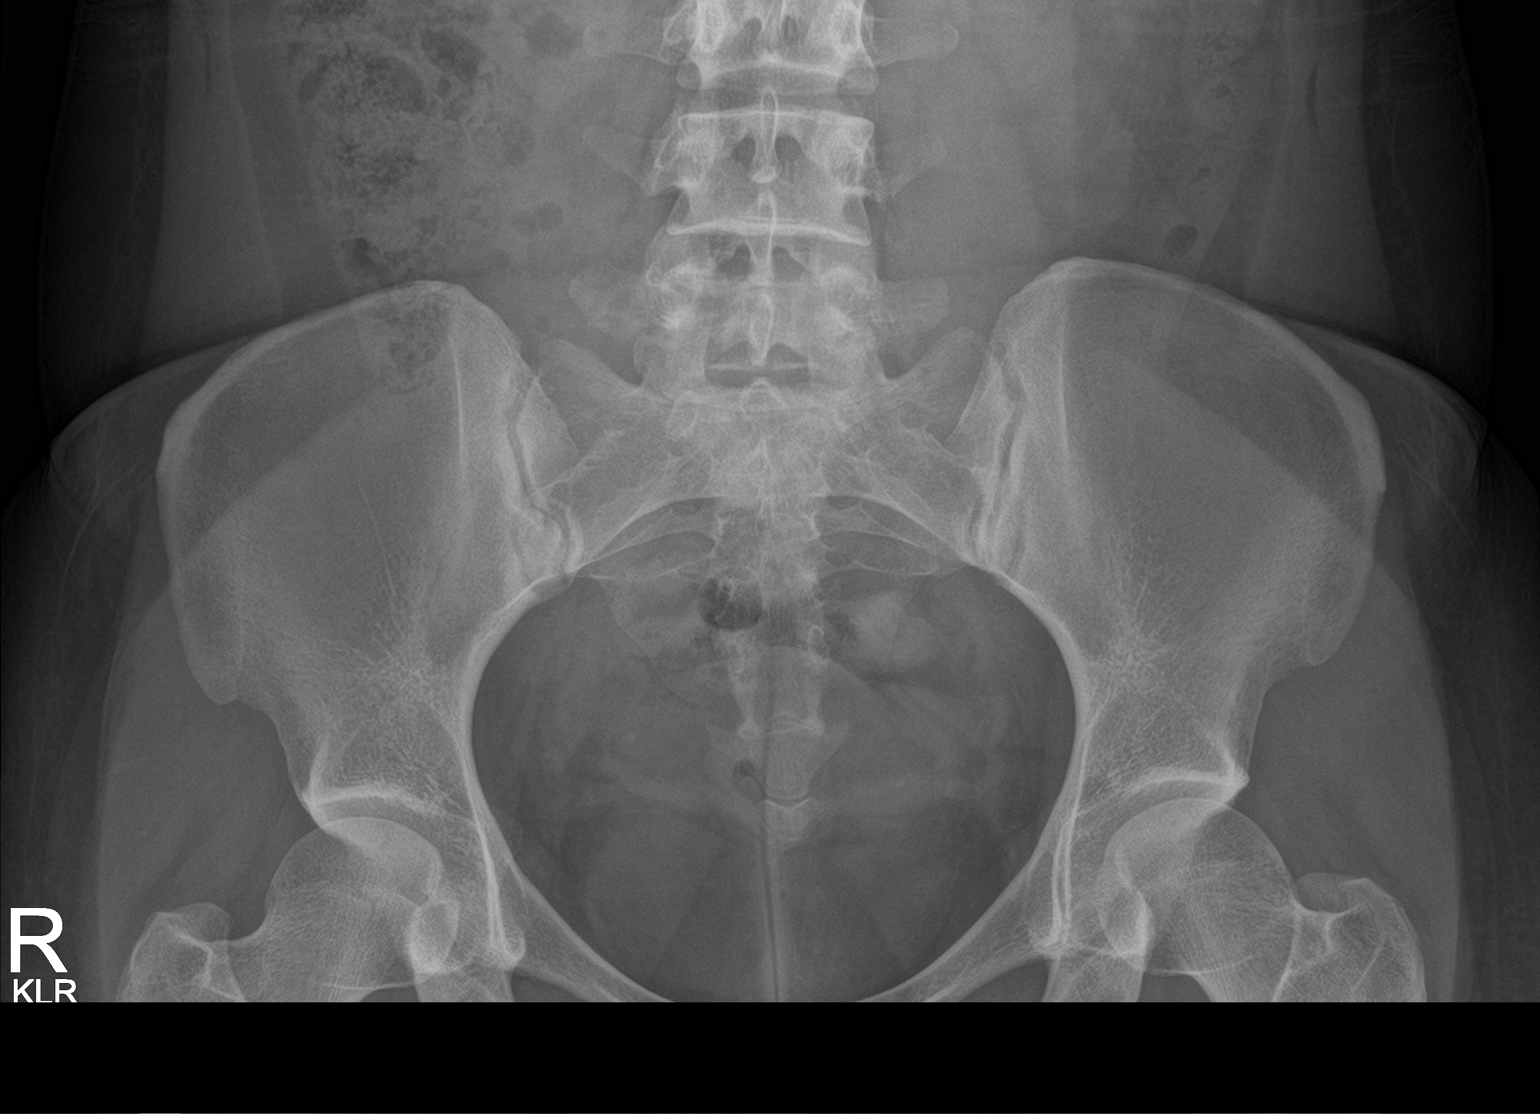

[2 of 2 positions shown; findings below may reference images not displayed]

FINDINGS: The bowel gas pattern is normal. Punctate calcifications project
over the upper pole of the right kidney compatible with known
right-sided nephrolithiasis. Punctate calcifications seen
bilaterally within the pelvis likely reflect known phleboliths as
demonstrated on prior CT. No acute nor suspicious osseous
abnormalities.
IMPRESSION: 1. Unremarkable bowel gas pattern.
2. Punctate right upper pole renal calculi, similar in size and
distribution as seen on recent CT.
3. Pelvic phleboliths are believed to account for the punctate
calcifications seen within the pelvis bilaterally. CT however may
better confirm the absence or presence of ureteral or bladder
calculi counting for this appearance.

## 2019-03-29 ENCOUNTER — Emergency Department
Admission: EM | Admit: 2019-03-29 | Discharge: 2019-03-29 | Disposition: A | Discharge status: Home / Self Care | Payer: Commercial Managed Care - PPO | Attending: Emergency Medicine | Admitting: Emergency Medicine

## 2019-03-29 ENCOUNTER — Encounter: Payer: Self-pay | Admitting: Emergency Medicine

## 2019-03-29 ENCOUNTER — Other Ambulatory Visit: Payer: Self-pay

## 2019-03-29 DIAGNOSIS — B349 Viral infection, unspecified: Secondary | ICD-10-CM | POA: Diagnosis not present

## 2019-03-29 DIAGNOSIS — M791 Myalgia, unspecified site: Secondary | ICD-10-CM | POA: Diagnosis present

## 2019-03-29 DIAGNOSIS — F17210 Nicotine dependence, cigarettes, uncomplicated: Secondary | ICD-10-CM | POA: Insufficient documentation

## 2019-03-29 MED ORDER — KETOROLAC TROMETHAMINE 60 MG/2ML IM SOLN
60.0000 mg | Freq: Once | INTRAMUSCULAR | Status: AC
  Administered 2019-03-29: 08:00:00 60 mg via INTRAMUSCULAR
  Filled 2019-03-29: qty 2

## 2019-03-29 MED ORDER — IBUPROFEN 600 MG PO TABS: 600.0000 mg | ORAL_TABLET | Freq: Three times a day (TID) | ORAL | 0 refills | Status: DC | PRN

## 2019-03-29 MED ORDER — FEXOFENADINE-PSEUDOEPHED ER 60-120 MG PO TB12: 1.0000 | ORAL_TABLET | Freq: Two times a day (BID) | ORAL | 0 refills | Status: DC
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
## 2019-03-29 NOTE — ED Notes (Signed)
Patient up to stat desk asking about wait time.  Patient given update.

## 2019-03-29 NOTE — ED Triage Notes (Signed)
Patient with complaint of generalized body aches, chills and headache that started last night.

## 2019-03-29 NOTE — ED Notes (Signed)
Patient to stat desk in no acute distress asking about wait time. Patient given update on wait time. Patient verbalizes understanding.  

## 2019-03-31 ENCOUNTER — Ambulatory Visit
Admission: EM | Admit: 2019-03-31 | Discharge: 2019-03-31 | Disposition: A | Discharge status: Home / Self Care | Payer: Commercial Managed Care - PPO | Attending: Urgent Care | Admitting: Urgent Care

## 2019-03-31 ENCOUNTER — Other Ambulatory Visit: Payer: Self-pay

## 2019-03-31 ENCOUNTER — Encounter: Payer: Self-pay | Admitting: Emergency Medicine

## 2019-03-31 DIAGNOSIS — J019 Acute sinusitis, unspecified: Secondary | ICD-10-CM | POA: Diagnosis not present

## 2019-03-31 DIAGNOSIS — B9689 Other specified bacterial agents as the cause of diseases classified elsewhere: Secondary | ICD-10-CM | POA: Diagnosis not present

## 2019-03-31 MED ORDER — DOXYCYCLINE HYCLATE 100 MG PO CAPS: 100.0000 mg | ORAL_CAPSULE | Freq: Two times a day (BID) | ORAL | 0 refills | Status: DC

## 2019-03-31 NOTE — Discharge Instructions (Signed)
It was very nice seeing you today in clinic. Thank you for entrusting me with your care.   Please utilize the medications that we discussed. Your prescriptions have been called in to your pharmacy. May use Tylenol and/or Ibuprofen as needed for pain/fever. Stay HYDRATED. Water and electrolyte containing beverages (Gatorade, Pedialyte) are best to prevent dehydration and electrolyte abnormalities. Rest as much as possible.   Make arrangements to follow up with your regular doctor in 1 week for re-evaluation. If your symptoms/condition worsens, please seek follow up care either here or in the ER. Please remember, our [facility] providers are "right here with you" when you need [place].   Again, it was my pleasure to take care of you today. Thank you for choosing our clinic. I hope that you start to feel better quickly.   Honor Loh, MSN, APRN, FNP-C, CEN Advanced Practice Provider [facility]

## 2019-03-31 NOTE — ED Triage Notes (Signed)
Patient states she has had ear ache and sinus congestion since this past week.
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
## 2019-04-28 ENCOUNTER — Other Ambulatory Visit: Payer: Self-pay

## 2019-04-28 ENCOUNTER — Ambulatory Visit
Admission: EM | Admit: 2019-04-28 | Discharge: 2019-04-28 | Disposition: A | Discharge status: Home / Self Care | Payer: Commercial Managed Care - PPO | Attending: Family Medicine | Admitting: Family Medicine

## 2019-04-28 DIAGNOSIS — N390 Urinary tract infection, site not specified: Secondary | ICD-10-CM | POA: Diagnosis not present

## 2019-04-28 LAB — URINALYSIS, COMPLETE (UACMP) WITH MICROSCOPIC
Bilirubin Urine: NEGATIVE
Glucose, UA: NEGATIVE mg/dL
Hgb urine dipstick: NEGATIVE
Ketones, ur: NEGATIVE mg/dL
Nitrite: POSITIVE — AB
Specific Gravity, Urine: 1.015 (ref 1.005–1.030)
pH: 8.5 — ABNORMAL HIGH (ref 5.0–8.0)

## 2019-04-28 MED ORDER — CEPHALEXIN 500 MG PO CAPS: 500.0000 mg | ORAL_CAPSULE | Freq: Two times a day (BID) | ORAL | 0 refills | Status: AC

## 2019-04-28 MED ORDER — FLUCONAZOLE 150 MG PO TABS: 150.0000 mg | ORAL_TABLET | Freq: Every day | ORAL | 0 refills | Status: DC
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
## 2019-04-28 NOTE — ED Triage Notes (Signed)
Pt with urgency, frequency and dysuria x one week.

## 2019-04-28 NOTE — Discharge Instructions (Signed)
Take medication as prescribed. Rest. Drink plenty of water.   Follow up with your primary care physician this week as needed. Return to Urgent care for new or worsening concerns.

## 2019-04-30 ENCOUNTER — Telehealth: Payer: Self-pay | Admitting: Emergency Medicine

## 2019-04-30 LAB — URINE CULTURE: Culture: >=100000 — AB

## 2019-04-30 NOTE — Telephone Encounter (Signed)
Urine culture was positive for proteus mirabilis and was given keflex  at urgent care visit. Pt contacted and made aware, educated on completing antibiotic and to follow up if symptoms are persistent. Verbalized understanding.

## 2019-10-05 ENCOUNTER — Encounter: Payer: Self-pay | Admitting: Emergency Medicine

## 2019-10-05 ENCOUNTER — Ambulatory Visit
Admission: EM | Admit: 2019-10-05 | Discharge: 2019-10-05 | Disposition: A | Discharge status: Home / Self Care | Payer: Commercial Managed Care - PPO | Attending: Emergency Medicine | Admitting: Emergency Medicine

## 2019-10-05 ENCOUNTER — Other Ambulatory Visit: Payer: Self-pay

## 2019-10-05 DIAGNOSIS — N3000 Acute cystitis without hematuria: Secondary | ICD-10-CM | POA: Diagnosis present

## 2019-10-05 DIAGNOSIS — B9689 Other specified bacterial agents as the cause of diseases classified elsewhere: Secondary | ICD-10-CM | POA: Insufficient documentation

## 2019-10-05 DIAGNOSIS — N760 Acute vaginitis: Secondary | ICD-10-CM | POA: Insufficient documentation

## 2019-10-05 LAB — URINALYSIS, COMPLETE (UACMP) WITH MICROSCOPIC
Bilirubin Urine: NEGATIVE
Glucose, UA: NEGATIVE mg/dL
Ketones, ur: NEGATIVE mg/dL
Nitrite: POSITIVE — AB
Specific Gravity, Urine: 1.025 (ref 1.005–1.030)
pH: 7 (ref 5.0–8.0)

## 2019-10-05 LAB — WET PREP, GENITAL
Sperm: NONE SEEN
Trich, Wet Prep: NONE SEEN
Yeast Wet Prep HPF POC: NONE SEEN

## 2019-10-05 MED ORDER — NITROFURANTOIN MONOHYD MACRO 100 MG PO CAPS: 100.0000 mg | ORAL_CAPSULE | Freq: Two times a day (BID) | ORAL | 0 refills | Status: AC

## 2019-10-05 MED ORDER — METRONIDAZOLE 500 MG PO TABS: 500.0000 mg | ORAL_TABLET | Freq: Two times a day (BID) | ORAL | 0 refills | Status: DC

## 2019-10-05 MED ORDER — FLUCONAZOLE 150 MG PO TABS: 150.0000 mg | ORAL_TABLET | ORAL | 0 refills | Status: DC | PRN

## 2019-10-05 NOTE — Discharge Instructions (Addendum)
You are being treated for UTI and bacterial vaginosis.  Take the antibiotics as prescribed until they're finished. If you think you're having a reaction, stop the medication, take benadryl and go to the nearest urgent care/emergency room. Take a probiotic while taking the antibiotic to decrease the chances of stomach upset.   We are sending your urine out for a culture. If we need to add or change any medications, our nurse will give you a call to let you know.

## 2019-10-05 NOTE — ED Triage Notes (Signed)
Patient c/o pain when urinating and pelvic pain that started about 4-5 days ago.  Patient states that he has had BV before and that it feels similar to that.  Patient denies fevers.
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
## 2019-10-07 LAB — URINE CULTURE: Culture: >=100000 — AB

## 2019-12-14 ENCOUNTER — Encounter: Payer: Self-pay | Admitting: Emergency Medicine

## 2019-12-14 ENCOUNTER — Other Ambulatory Visit: Payer: Self-pay

## 2019-12-14 ENCOUNTER — Ambulatory Visit
Admission: EM | Admit: 2019-12-14 | Discharge: 2019-12-14 | Disposition: A | Discharge status: Home / Self Care | Payer: PRIVATE HEALTH INSURANCE | Attending: Family Medicine | Admitting: Family Medicine

## 2019-12-14 DIAGNOSIS — R300 Dysuria: Secondary | ICD-10-CM | POA: Insufficient documentation

## 2019-12-14 DIAGNOSIS — N760 Acute vaginitis: Secondary | ICD-10-CM | POA: Diagnosis present

## 2019-12-14 DIAGNOSIS — B9689 Other specified bacterial agents as the cause of diseases classified elsewhere: Secondary | ICD-10-CM | POA: Insufficient documentation

## 2019-12-14 LAB — URINALYSIS, COMPLETE (UACMP) WITH MICROSCOPIC
Bilirubin Urine: NEGATIVE
Glucose, UA: NEGATIVE mg/dL
Hgb urine dipstick: NEGATIVE
Ketones, ur: NEGATIVE mg/dL
Nitrite: NEGATIVE
Protein, ur: NEGATIVE mg/dL
RBC / HPF: NONE SEEN RBC/hpf (ref 0–5)
Specific Gravity, Urine: 1.025 (ref 1.005–1.030)
pH: 6.5 (ref 5.0–8.0)

## 2019-12-14 LAB — WET PREP, GENITAL
Sperm: NONE SEEN
Trich, Wet Prep: NONE SEEN
Yeast Wet Prep HPF POC: NONE SEEN

## 2019-12-14 MED ORDER — CEPHALEXIN 500 MG PO CAPS: 500.0000 mg | ORAL_CAPSULE | Freq: Two times a day (BID) | ORAL | 0 refills | Status: DC

## 2019-12-14 MED ORDER — METRONIDAZOLE 500 MG PO TABS: 500.0000 mg | ORAL_TABLET | Freq: Two times a day (BID) | ORAL | 0 refills | Status: DC

## 2019-12-14 NOTE — ED Triage Notes (Signed)
Patient c/o burning when urinating and urinary frequency that started 3 days ago.  Patient also reports vaginal discharge.

## 2019-12-14 NOTE — Discharge Instructions (Signed)
Antibiotics as prescribed.  Take care  Dr. Murial Beam  
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
## 2019-12-16 LAB — URINE CULTURE: Culture: >=100000 — AB

## 2020-02-02 DIAGNOSIS — U071 COVID-19: Secondary | ICD-10-CM

## 2020-02-02 HISTORY — DX: COVID-19: U07.1

## 2020-02-25 ENCOUNTER — Ambulatory Visit (INDEPENDENT_AMBULATORY_CARE_PROVIDER_SITE_OTHER): Payer: PRIVATE HEALTH INSURANCE

## 2020-02-25 ENCOUNTER — Encounter: Payer: Self-pay | Admitting: Emergency Medicine

## 2020-02-25 ENCOUNTER — Other Ambulatory Visit: Payer: Self-pay

## 2020-02-25 ENCOUNTER — Ambulatory Visit
Admission: EM | Admit: 2020-02-25 | Discharge: 2020-02-25 | Disposition: A | Discharge status: Home / Self Care | Payer: PRIVATE HEALTH INSURANCE | Attending: Family Medicine | Admitting: Family Medicine

## 2020-02-25 DIAGNOSIS — R0789 Other chest pain: Secondary | ICD-10-CM

## 2020-02-25 DIAGNOSIS — R059 Cough, unspecified: Secondary | ICD-10-CM

## 2020-02-25 DIAGNOSIS — R05 Cough: Secondary | ICD-10-CM

## 2020-02-25 MED ORDER — ALBUTEROL SULFATE HFA 108 (90 BASE) MCG/ACT IN AERS: 1.0000 | INHALATION_SPRAY | Freq: Four times a day (QID) | RESPIRATORY_TRACT | 0 refills | Status: DC | PRN

## 2020-02-25 MED ORDER — PREDNISONE 50 MG PO TABS: ORAL_TABLET | ORAL | 0 refills | Status: DC

## 2020-02-25 MED ORDER — BENZONATATE 200 MG PO CAPS: 200.0000 mg | ORAL_CAPSULE | Freq: Three times a day (TID) | ORAL | 0 refills | Status: DC | PRN

## 2020-02-25 NOTE — Discharge Instructions (Signed)
Medication as prescribed. ° °Rest. Fluids. ° °Take care ° °Dr. Josepha Barbier  °
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
## 2020-02-25 NOTE — ED Triage Notes (Signed)
Patient states she was positive for COVID on 04/26. She was retested on 05/06 and was negative so she could return to work. She has continued to have cough and having mild shortness of breath.

## 2020-09-09 ENCOUNTER — Other Ambulatory Visit: Payer: Self-pay

## 2020-09-09 ENCOUNTER — Emergency Department
Admission: EM | Admit: 2020-09-09 | Discharge: 2020-09-09 | Disposition: A | Discharge status: Home / Self Care | Payer: PRIVATE HEALTH INSURANCE | Attending: Student in an Organized Health Care Education/Training Program | Admitting: Student in an Organized Health Care Education/Training Program

## 2020-09-09 DIAGNOSIS — R112 Nausea with vomiting, unspecified: Secondary | ICD-10-CM | POA: Insufficient documentation

## 2020-09-09 DIAGNOSIS — R1084 Generalized abdominal pain: Secondary | ICD-10-CM

## 2020-09-09 DIAGNOSIS — Z20822 Contact with and (suspected) exposure to covid-19: Secondary | ICD-10-CM | POA: Insufficient documentation

## 2020-09-09 DIAGNOSIS — R55 Syncope and collapse: Secondary | ICD-10-CM | POA: Insufficient documentation

## 2020-09-09 DIAGNOSIS — Z8616 Personal history of COVID-19: Secondary | ICD-10-CM | POA: Insufficient documentation

## 2020-09-09 DIAGNOSIS — Z87891 Personal history of nicotine dependence: Secondary | ICD-10-CM | POA: Insufficient documentation

## 2020-09-09 DIAGNOSIS — R197 Diarrhea, unspecified: Secondary | ICD-10-CM | POA: Insufficient documentation

## 2020-09-09 LAB — URINALYSIS, COMPLETE (UACMP) WITH MICROSCOPIC
Bacteria, UA: NONE SEEN
Bilirubin Urine: NEGATIVE
Glucose, UA: NEGATIVE mg/dL
Hgb urine dipstick: NEGATIVE
Ketones, ur: NEGATIVE mg/dL
Nitrite: NEGATIVE
Protein, ur: NEGATIVE mg/dL
Specific Gravity, Urine: 1.014 (ref 1.005–1.030)
pH: 6 (ref 5.0–8.0)

## 2020-09-09 LAB — BASIC METABOLIC PANEL
Anion gap: 11 (ref 5–15)
BUN: 16 mg/dL (ref 6–20)
CO2: 24 mmol/L (ref 22–32)
Calcium: 9.3 mg/dL (ref 8.9–10.3)
Chloride: 101 mmol/L (ref 98–111)
Creatinine, Ser: 0.62 mg/dL (ref 0.44–1.00)
GFR, Estimated: >60 mL/min (ref >60)
Glucose, Bld: 98 mg/dL (ref 70–99)
Potassium: 4.1 mmol/L (ref 3.5–5.1)
Sodium: 136 mmol/L (ref 135–145)

## 2020-09-09 LAB — HEPATIC FUNCTION PANEL
ALT: 12 U/L (ref 0–44)
AST: 14 U/L — ABNORMAL LOW (ref 15–41)
Albumin: 4.6 g/dL (ref 3.5–5.0)
Alkaline Phosphatase: 62 U/L (ref 38–126)
Bilirubin, Direct: <0.1 mg/dL (ref 0.0–0.2)
Total Bilirubin: 0.5 mg/dL (ref 0.3–1.2)
Total Protein: 7.7 g/dL (ref 6.5–8.1)

## 2020-09-09 LAB — CBC
HCT: 41 % (ref 36.0–46.0)
Hemoglobin: 13.9 g/dL (ref 12.0–15.0)
MCH: 31.2 pg (ref 26.0–34.0)
MCHC: 33.9 g/dL (ref 30.0–36.0)
MCV: 92.1 fL (ref 80.0–100.0)
Platelets: 240 10*3/uL (ref 150–400)
RBC: 4.45 MIL/uL (ref 3.87–5.11)
RDW: 12.7 % (ref 11.5–15.5)
WBC: 4.2 10*3/uL (ref 4.0–10.5)
nRBC: 0 % (ref 0.0–0.2)

## 2020-09-09 LAB — LIPASE, BLOOD: Lipase: 36 U/L (ref 11–51)

## 2020-09-09 LAB — RESP PANEL BY RT-PCR (FLU A&B, COVID) ARPGX2
Influenza A by PCR: NEGATIVE
Influenza B by PCR: NEGATIVE
SARS Coronavirus 2 by RT PCR: NEGATIVE

## 2020-09-09 MED ORDER — CEPHALEXIN 500 MG PO CAPS: 500.0000 mg | ORAL_CAPSULE | Freq: Three times a day (TID) | ORAL | 0 refills | Status: AC

## 2020-09-09 MED ORDER — ACETAMINOPHEN 500 MG PO TABS
1000.0000 mg | ORAL_TABLET | Freq: Once | ORAL | Status: AC
  Administered 2020-09-09: 1000 mg via ORAL
  Filled 2020-09-09: qty 2

## 2020-09-09 MED ORDER — PROMETHAZINE HCL 25 MG/ML IJ SOLN: 12.5000 mg | Freq: Four times a day (QID) | INTRAMUSCULAR | Status: DC | PRN

## 2020-09-09 MED ORDER — SODIUM CHLORIDE 0.9 % IV BOLUS
1000.0000 mL | Freq: Once | INTRAVENOUS | Status: AC
  Administered 2020-09-09: 1000 mL via INTRAVENOUS

## 2020-09-09 MED ORDER — PROMETHAZINE HCL 12.5 MG PO TABS: 12.5000 mg | ORAL_TABLET | Freq: Four times a day (QID) | ORAL | 0 refills | Status: DC | PRN
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
## 2020-09-09 NOTE — ED Triage Notes (Signed)
Pt here via [facility].   Pt with complaints of generalized body aches, diarrhea, nausea, vomiting, pt reports she feels like her stomach is burning. Pt also had syncopal episode on Tuesday evening.   Pt A&Ox4, respiration even and unlabored. Pt in NAD at this time.

## 2020-09-09 NOTE — Discharge Instructions (Addendum)

## 2020-09-09 NOTE — ED Notes (Signed)
poct pregnancy Negative 

## 2020-10-19 ENCOUNTER — Ambulatory Visit
Admission: EM | Admit: 2020-10-19 | Discharge: 2020-10-19 | Disposition: A | Discharge status: Home / Self Care | Payer: PRIVATE HEALTH INSURANCE | Attending: Sports Medicine | Admitting: Sports Medicine

## 2020-10-19 ENCOUNTER — Other Ambulatory Visit: Payer: Self-pay

## 2020-10-19 DIAGNOSIS — J069 Acute upper respiratory infection, unspecified: Secondary | ICD-10-CM

## 2020-10-19 DIAGNOSIS — U071 COVID-19: Secondary | ICD-10-CM | POA: Insufficient documentation

## 2020-10-19 LAB — RAPID INFLUENZA A&B ANTIGENS
Influenza A (ARMC): NEGATIVE
Influenza B (ARMC): NEGATIVE

## 2020-10-19 MED ORDER — PROMETHAZINE-DM 6.25-15 MG/5ML PO SYRP: 5.0000 mL | ORAL_SOLUTION | Freq: Four times a day (QID) | ORAL | 0 refills | Status: DC | PRN

## 2020-10-19 MED ORDER — BENZONATATE 100 MG PO CAPS: 200.0000 mg | ORAL_CAPSULE | Freq: Three times a day (TID) | ORAL | 0 refills | Status: DC
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
## 2020-10-19 NOTE — Discharge Instructions (Addendum)
Slate at home until you have the results of your COVID test.  If you are positive then you will need to quarantine for 5 days from when your symptoms started.  After 5 days you can break quarantine if your symptoms have improved and you have not run a fever for 24 hours without Tylenol or ibuprofen.  You need to wear a mask for an additional 5 days after you get out of quarantine around other people.  Use Tylenol and ibuprofen as needed for fever and body aches.  Use the Tessalon Perles during the day for cough and the Promethazine DM cough syrup at nighttime as will make you drowsy.  If you develop any worsening shortness of breath-especially at rest, you are unable to speak in full sentences, or you develop bluish hue to your lips you to go to the ER for evaluation.

## 2020-10-19 NOTE — ED Triage Notes (Signed)
Patient complains of nasal congestion, body aches, fever and cough x yesterday.

## 2020-10-20 LAB — SARS CORONAVIRUS 2 (TAT 6-24 HRS): SARS Coronavirus 2: POSITIVE — AB

## 2020-11-10 ENCOUNTER — Encounter: Payer: Self-pay | Admitting: Internal Medicine

## 2021-01-05 ENCOUNTER — Encounter: Payer: Self-pay | Admitting: *Deleted

## 2021-01-07 ENCOUNTER — Ambulatory Visit: Payer: PRIVATE HEALTH INSURANCE | Admitting: Internal Medicine

## 2021-03-28 ENCOUNTER — Other Ambulatory Visit: Payer: Self-pay

## 2021-03-28 ENCOUNTER — Ambulatory Visit: Payer: Self-pay | Admitting: Family Medicine

## 2021-03-28 ENCOUNTER — Encounter: Payer: Self-pay | Admitting: Family Medicine

## 2021-03-28 DIAGNOSIS — N760 Acute vaginitis: Secondary | ICD-10-CM

## 2021-03-28 DIAGNOSIS — Z720 Tobacco use: Secondary | ICD-10-CM

## 2021-03-28 DIAGNOSIS — B9689 Other specified bacterial agents as the cause of diseases classified elsewhere: Secondary | ICD-10-CM

## 2021-03-28 DIAGNOSIS — Z113 Encounter for screening for infections with a predominantly sexual mode of transmission: Secondary | ICD-10-CM

## 2021-03-28 LAB — WET PREP FOR TRICH, YEAST, CLUE
Trichomonas Exam: NEGATIVE
Yeast Exam: NEGATIVE

## 2021-03-28 MED ORDER — METRONIDAZOLE 500 MG PO TABS: 500.0000 mg | ORAL_TABLET | Freq: Two times a day (BID) | ORAL | 0 refills | Status: AC

## 2021-03-28 NOTE — Progress Notes (Signed)
[facility] Department STI clinic/screening visit  Subjective:  Sharon Chandler is a 30 y.o. female being seen today for an STI screening visit. The patient reports they do have symptoms.  Patient reports that they do not desire a pregnancy in the next year.   They reported they are not interested in discussing contraception today.  Patient's last menstrual period was 03/11/2021 (exact date).   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   See flowsheet for further details and programmatic requirements.    The following portions of the patient's history were reviewed and updated as appropriate: allergies, current medications, past medical history, past social history, past surgical history and problem list.  Objective:  There were no vitals filed for this visit.  Physical Exam Vitals and nursing note reviewed.  Constitutional:      Appearance: Normal appearance.  HENT:     Head: Normocephalic and atraumatic.     Mouth/Throat:     Mouth: Mucous membranes are moist.     Pharynx: Oropharynx is clear. No oropharyngeal exudate or posterior oropharyngeal erythema.  Pulmonary:     Effort: Pulmonary effort is normal.  Chest:  Breasts:    Right: No axillary adenopathy or supraclavicular adenopathy.     Left: No axillary adenopathy or supraclavicular adenopathy.  Abdominal:     General: Abdomen is flat.     Palpations: There is no mass.     Tenderness: There is no abdominal tenderness. There is no rebound.  Genitourinary:    General: Normal vulva.     Exam position: Lithotomy position.     Pubic Area:  No rash or pubic lice.      Labia:        Right: No rash or lesion.        Left: No rash or lesion.      Vagina: Normal. No vaginal discharge, erythema, bleeding or lesions.     Cervix: No cervical motion tenderness, discharge, friability, lesion or erythema.     Uterus: Normal.      Adnexa: Right adnexa normal and left adnexa normal.     Rectum: Normal.     Comments: External genitalia without, lice, nits, erythema, edema , lesions or inguinal adenopathy. Vagina with normal mucosa and white discharge and pH >  4.  Cervix without visual lesions, uterus firm, mobile, non-tender, no masses, CMT adnexal fullness or tenderness.   Lymphadenopathy:     Head:     Right side of head: No preauricular or posterior auricular adenopathy.     Left side of head: No preauricular or posterior auricular adenopathy.     Cervical: No cervical adenopathy.     Upper Body:     Right upper body: No supraclavicular or axillary adenopathy.     Left upper body: No supraclavicular or axillary adenopathy.     Lower Body: No right inguinal adenopathy. No left inguinal adenopathy.  Skin:    General: Skin is warm and dry.     Findings: No rash.     Comments: Tattoos present   Neurological:     Mental  Status: She is alert and oriented to person, place, and time.     Assessment and Plan:  HAYES REHFELDT is a 30 y.o. female presenting to the [facility] Department for STI screening  1. Screening examination for venereal disease Patient accepted all screenings including wet prep, oral, vaginal CT/GC and bloodwork for HIV/RPR.  Patient meets criteria for HepB screening? No. Ordered? No - does not meet criteria  Patient meets criteria for HepC screening? No. Ordered? No - does  not meet criteria   Patient is currently using Postpartum Sterilization to prevent pregnancy.   - Chlamydia/Gonorrhea [facility] - HIV [place] [facility] - Syphilis Serology, [place] [facility] - WET PREP FOR TRICH, YEAST, CLUE -  Chlamydia/Gonorrhea [facility]  2. Bacterial vaginosis  Wet prep results + amine and + clue  [id number] needed if + amine or + Clue treat for BV with Metronidazole 500 mg PO BID x 7.   Discussed time line for State Lab results and that patient will be called with positive results and encouraged patient to call if she had not heard in 2 weeks.  Counseled to return or seek care for continued or worsening symptoms Recommended condom use with all sex  - metroNIDAZOLE (FLAGYL) 500 MG tablet; Take 1 tablet (500 mg total) by mouth 2 (two) times daily for 7 days.  Dispense: 14 tablet; Refill: 0  3. 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  Quit line information given to patient.      Return for as needed.  No future appointments.  Wendi Snipes, FNP

## 2021-03-28 NOTE — Progress Notes (Signed)
Pt here for STD screening.  Wet mount results reviewed.  Medication dispensed per Provider orders. Pt declined condoms. Jkayla Spiewak M Kree Armato, RN  

## 2021-03-29 ENCOUNTER — Telehealth: Payer: Self-pay

## 2021-03-29 NOTE — Telephone Encounter (Signed)
Consulted with provider Wendi Snipes, FNP about this pt's testing. Vaginal GC/Chlamydia sample was discarded by state due to mix up in the lab with paperwork and sample. Pt did complain of symptoms on day of visit.  If pt has not started Metronidazole medication, do not start and [facility] to collect vaginal GC/Chlamydia sample ASAP (work her in). If pt has started Metronidazole, complete medication and schedule appt with [facility] for 14 days AFTER completion of Metronidazole.   Phone call to pt. Pt did not answer phone. Left message on voicemail to call [facility] at [phone], gave brief info about issue with the lab and specimen and options.

## 2021-03-30 ENCOUNTER — Telehealth: Payer: Self-pay | Admitting: Family Medicine

## 2021-03-30 NOTE — Telephone Encounter (Signed)
Phone call to pt. Left message on voicemail that RN with [facility] is calling about specimen collected earlier this week. There was an issue in lab and we need to recollect a new vaginal GC/Chlamydia sample. Before scheduling appt, need to know if Metronidazole has been started. Please call us back at [phone] and let clerical know a nurse has been trying to contact you.

## 2021-03-30 NOTE — Telephone Encounter (Signed)
Patient called the number that was left on her VM and was not getting an answer. Let her know that I would leave her info with the nurse. She will be awake for about another hour for a call back.

## 2021-03-30 NOTE — Telephone Encounter (Signed)
See original phone note dated 03/29/21 for details.

## 2021-03-30 NOTE — Telephone Encounter (Signed)
Phone call received back from pt, new phone note created.   Phone call to pt. Discussed testing that needs to be repeated. Pt states she started Metronidazole on 03/28/21. Per provider instruction, pt counseled to complete the course of Metronidazole. Should complete medication 04/03/21. Offered to schedule appt for recollection of sample on 04/18/21. Pt states she does not have her work schedule yet and cannot schedule appt at this time. Pt states she will call for appt once she has her work schedule. RN emphasized that the collection should be at least 2 weeks after she finishes the Metronidazole. Pt counseled that if she desires, she can self collect for a shorter visit and RN can take sample to lab. Pt expresses understanding.

## 2021-03-31 LAB — HM HIV SCREENING LAB: HM HIV Screening: NEGATIVE

## 2021-03-31 NOTE — Progress Notes (Signed)
results

## 2021-04-05 NOTE — Addendum Note (Signed)
Addended by: Stasia Cavalier on: 04/05/2021 11:46 AM   Modules accepted: Orders

## 2021-04-29 ENCOUNTER — Encounter (HOSPITAL_COMMUNITY): Payer: Self-pay | Admitting: Emergency Medicine

## 2021-04-29 ENCOUNTER — Other Ambulatory Visit: Payer: Self-pay

## 2021-04-29 ENCOUNTER — Ambulatory Visit (HOSPITAL_COMMUNITY)
Admission: EM | Admit: 2021-04-29 | Discharge: 2021-04-29 | Disposition: A | Discharge status: Home / Self Care | Payer: Self-pay | Attending: Urgent Care | Admitting: Urgent Care

## 2021-04-29 DIAGNOSIS — J019 Acute sinusitis, unspecified: Secondary | ICD-10-CM

## 2021-04-29 DIAGNOSIS — R0981 Nasal congestion: Secondary | ICD-10-CM

## 2021-04-29 MED ORDER — PSEUDOEPHEDRINE HCL 60 MG PO TABS: 60.0000 mg | ORAL_TABLET | Freq: Three times a day (TID) | ORAL | 0 refills | Status: AC | PRN

## 2021-04-29 MED ORDER — CETIRIZINE HCL 10 MG PO TABS: 10.0000 mg | ORAL_TABLET | Freq: Every day | ORAL | 0 refills | Status: AC

## 2021-04-29 MED ORDER — PROMETHAZINE-DM 6.25-15 MG/5ML PO SYRP: 5.0000 mL | ORAL_SOLUTION | Freq: Every evening | ORAL | 0 refills | Status: AC | PRN

## 2021-04-29 MED ORDER — AMOXICILLIN-POT CLAVULANATE 875-125 MG PO TABS: 1.0000 | ORAL_TABLET | Freq: Two times a day (BID) | ORAL | 0 refills | Status: AC

## 2021-04-29 NOTE — ED Triage Notes (Addendum)
Patient c/o nasal congestion and headache x 6 days.   Patient c/o sore throat that started today.   Patient denies fever and is unaware of temperature at home.   Patient endorses having a PCR COVID test performed 5 days ago and reports negative test results.   Patient endorses chills and generalized body aches. Patient endorses painful swallowing.   Patient took Tylenol with no relief of symptoms.
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
## 2021-05-02 ENCOUNTER — Ambulatory Visit: Payer: Self-pay

## 2022-03-16 ENCOUNTER — Ambulatory Visit: Payer: Self-pay | Admitting: Nurse Practitioner

## 2022-03-16 ENCOUNTER — Encounter: Payer: Self-pay | Admitting: Nurse Practitioner

## 2022-03-16 DIAGNOSIS — Z113 Encounter for screening for infections with a predominantly sexual mode of transmission: Secondary | ICD-10-CM

## 2022-03-16 LAB — WET PREP FOR TRICH, YEAST, CLUE
Trichomonas Exam: NEGATIVE
Yeast Exam: NEGATIVE

## 2022-03-16 NOTE — Progress Notes (Signed)
WET PREP negative. No provider orders. Instr other test results pending. Verbalized understanding.  Lethea Killings RN

## 2022-03-16 NOTE — Progress Notes (Signed)
[facility] Department  STI clinic/screening visit [address] [place] [address] [phone]  Subjective:  Sharon Chandler is a 31 y.o. female being seen today for an STI screening visit. The patient reports they do not have symptoms.  Patient reports that they do not desire a pregnancy in the next year.   They reported they are not interested in discussing contraception today.  Patient had a BTL.   Patient's last menstrual period was 03/07/2022 (approximate).   Patient has the following medical conditions:   Patient Active Problem List   Diagnosis Date Noted   Hydronephrosis 04/19/2017   History of postpartum depression 04/04/2016    Chief Complaint  Patient presents with   SEXUALLY TRANSMITTED DISEASE    STI screening. No sx    HPI  Patient reports to clinic today for STD screening.  Patient states she is asymptomatic.    Last HIV test per patient/review of record was 03/31/2021. Patient reports last pap was 11/2020.   Screening for MPX risk: Does the patient have an unexplained rash? No Is the patient MSM? No Does the patient endorse multiple sex partners or anonymous sex partners? No Did the patient have close or sexual contact with a person diagnosed with MPX? No Has the patient traveled outside the [place] where MPX is endemic? No Is there a high clinical suspicion for MPX-- evidenced by one of the following No  -Unlikely to be chickenpox  -Lymphadenopathy  -Rash that present in same phase of evolution on any given body part See flowsheet for further details and programmatic requirements.   Immunization history:  Immunization History  Administered Date(s) Administered   Hepatitis A 09/24/2007   Hepatitis B 12/11/1994, 02/07/1995, 11/15/1995   Hpv-Unspecified 07/12/2007   Influenza,inj,Quad PF,6+ Mos 09/22/2013   Tdap 01/03/2016, 06/04/2017     The following portions of the patient's history were reviewed and updated as  appropriate: allergies, current medications, past medical history, past social history, past surgical history and problem list.  Objective:  There were no vitals filed for this visit.  Physical Exam Constitutional:      Appearance: Normal appearance.  HENT:     Head: Normocephalic.     Right Ear: External ear normal.     Left Ear: External ear normal.     Nose: Nose normal.     Mouth/Throat:     Lips: Pink.     Mouth: Mucous membranes are moist.     Comments: No visible dental caries  Pulmonary:     Effort: Pulmonary effort is normal.  Abdominal:     General: Abdomen is flat.     Palpations: Abdomen is soft.  Genitourinary:    Comments: External genitalia/pubic area without nits, lice, edema, erythema, lesions and inguinal adenopathy. Vagina with normal mucosa and discharge. Cervix without visible lesions. Uterus firm, mobile, nt, no masses, no CMT, no adnexal tenderness or fullness. pH 4.5 Musculoskeletal:     Cervical back: Full passive range of motion without pain, normal range of motion and neck supple.  Skin:    General: Skin is warm and dry.  Neurological:     Mental Status: She is alert and oriented to person, place, and time.  Psychiatric:        Attention and Perception: Attention normal.        Mood and Affect: Mood normal.        Speech: Speech normal.        Behavior: Behavior normal. Behavior is  cooperative.      Assessment and Plan:  SANDRINA HEATON is a 31 y.o. female presenting to the [facility] Department for STI screening  1. Screening examination for venereal disease -31 year old female in clinic today for STD screening. -Patient accepted all screenings including vaginal CT/GC, wet prep, and declines bloodwork for HIV/RPR.  Patient meets criteria for HepB screening? Yes. Ordered? No - refused Patient meets criteria for HepC screening? Yes. Ordered? No - refused  Treat wet prep per standing order Discussed time line for State Lab  results and that patient will be called with positive results and encouraged patient to call if she had not heard in 2 weeks.  Counseled to return or seek care for continued or worsening symptoms Recommended condom use with all sex  Patient is currently using Ster[facility] to prevent pregnancy.    - WET PREP FOR TRICH, YEAST, CLUE - Chlamydia/Gonorrhea [place] [facility]     Return if symptoms worsen or fail to improve.    Glenna Fellows, FNP

## 2023-08-28 ENCOUNTER — Ambulatory Visit
Admission: EM | Admit: 2023-08-28 | Discharge: 2023-08-28 | Disposition: A | Discharge status: Home / Self Care | Payer: Medicaid Other | Attending: Emergency Medicine | Admitting: Emergency Medicine

## 2023-08-28 DIAGNOSIS — J069 Acute upper respiratory infection, unspecified: Secondary | ICD-10-CM | POA: Insufficient documentation

## 2023-08-28 DIAGNOSIS — Z720 Tobacco use: Secondary | ICD-10-CM

## 2023-08-28 DIAGNOSIS — J029 Acute pharyngitis, unspecified: Secondary | ICD-10-CM | POA: Insufficient documentation

## 2023-08-28 LAB — GROUP A STREP BY PCR: Group A Strep by PCR: NOT DETECTED

## 2023-08-28 NOTE — ED Triage Notes (Signed)
Sx x 1 week.   Congestion-sore throat-headache
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
## 2023-08-28 NOTE — Discharge Instructions (Addendum)
Strep test is negative , most likely you have a viral illness: no antibiotic as indicated at this time, May treat with OTC meds of choice as label directed(mucinex, chloraseptic throat lozenges, tylenol/ibuprofen,etc). Make sure to drink plenty of fluids to stay hydrated(gatorade, water, popsicles,jello,etc), avoid caffeine products. Follow up with PCP. Return as needed.

## 2023-09-18 ENCOUNTER — Ambulatory Visit
Admission: EM | Admit: 2023-09-18 | Discharge: 2023-09-18 | Disposition: A | Discharge status: Home / Self Care | Payer: Medicaid Other | Attending: Emergency Medicine | Admitting: Emergency Medicine

## 2023-09-18 ENCOUNTER — Encounter: Payer: Self-pay | Admitting: Emergency Medicine

## 2023-09-18 DIAGNOSIS — J069 Acute upper respiratory infection, unspecified: Secondary | ICD-10-CM | POA: Insufficient documentation

## 2023-09-18 LAB — RESP PANEL BY RT-PCR (FLU A&B, COVID) ARPGX2
Influenza A by PCR: NEGATIVE
Influenza B by PCR: NEGATIVE
SARS Coronavirus 2 by RT PCR: NEGATIVE

## 2023-09-18 LAB — GROUP A STREP BY PCR: Group A Strep by PCR: NOT DETECTED

## 2023-09-18 MED ORDER — BENZONATATE 100 MG PO CAPS: 100.0000 mg | ORAL_CAPSULE | Freq: Three times a day (TID) | ORAL | 0 refills | Status: AC
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
## 2023-09-18 NOTE — Discharge Instructions (Addendum)
Your strep test, covid and flu are negative. Most likely you have a viral illness: no antibiotic is indicated at this time, May treat with OTC meds of choice.  Take Tessalon as prescribed , make sure to drink plenty of fluids to stay hydrated(gatorade, water, popsicles,jello,etc), avoid caffeine products. Follow up with PCP. Return as needed.

## 2023-09-18 NOTE — ED Triage Notes (Signed)
Pt presents with a headache, cough, congestion and sore throat x 2 days.
